# Patient Record
Sex: Female | Born: 1955 | Race: White | Hispanic: No | Marital: Married | Smoking: Never smoker
Health system: Southern US, Community
[De-identification: ages and names within clinical notes are randomized; demographics above are authoritative.]

## PROBLEM LIST (undated history)

## (undated) DIAGNOSIS — Z8719 Personal history of other diseases of the digestive system: Secondary | ICD-10-CM

## (undated) DIAGNOSIS — K5792 Diverticulitis of intestine, part unspecified, without perforation or abscess without bleeding: Secondary | ICD-10-CM

## (undated) DIAGNOSIS — G43909 Migraine, unspecified, not intractable, without status migrainosus: Secondary | ICD-10-CM

## (undated) DIAGNOSIS — J302 Other seasonal allergic rhinitis: Secondary | ICD-10-CM

## (undated) DIAGNOSIS — M1990 Unspecified osteoarthritis, unspecified site: Secondary | ICD-10-CM

## (undated) DIAGNOSIS — G8929 Other chronic pain: Secondary | ICD-10-CM

## (undated) DIAGNOSIS — M545 Low back pain, unspecified: Secondary | ICD-10-CM

## (undated) DIAGNOSIS — K760 Fatty (change of) liver, not elsewhere classified: Secondary | ICD-10-CM

## (undated) DIAGNOSIS — I10 Essential (primary) hypertension: Secondary | ICD-10-CM

## (undated) DIAGNOSIS — Z780 Asymptomatic menopausal state: Secondary | ICD-10-CM

## (undated) DIAGNOSIS — E785 Hyperlipidemia, unspecified: Secondary | ICD-10-CM

## (undated) DIAGNOSIS — K635 Polyp of colon: Secondary | ICD-10-CM

## (undated) DIAGNOSIS — E039 Hypothyroidism, unspecified: Secondary | ICD-10-CM

## (undated) DIAGNOSIS — T7840XA Allergy, unspecified, initial encounter: Secondary | ICD-10-CM

## (undated) DIAGNOSIS — G4733 Obstructive sleep apnea (adult) (pediatric): Secondary | ICD-10-CM

## (undated) DIAGNOSIS — M4850XA Collapsed vertebra, not elsewhere classified, site unspecified, initial encounter for fracture: Secondary | ICD-10-CM

## (undated) DIAGNOSIS — K219 Gastro-esophageal reflux disease without esophagitis: Secondary | ICD-10-CM

## (undated) HISTORY — DX: Unspecified osteoarthritis, unspecified site: M19.90

## (undated) HISTORY — DX: Low back pain, unspecified: M54.50

## (undated) HISTORY — DX: Other seasonal allergic rhinitis: J30.2

## (undated) HISTORY — DX: Other chronic pain: G89.29

## (undated) HISTORY — DX: Hypothyroidism, unspecified: E03.9

## (undated) HISTORY — DX: Collapsed vertebra, not elsewhere classified, site unspecified, initial encounter for fracture: M48.50XA

## (undated) HISTORY — PX: KNEE SURGERY: SHX244

## (undated) HISTORY — DX: Personal history of other diseases of the digestive system: Z87.19

## (undated) HISTORY — PX: ABDOMINAL HYSTERECTOMY: SHX81

## (undated) HISTORY — DX: Asymptomatic menopausal state: Z78.0

## (undated) HISTORY — PX: COLON SURGERY: SHX602

## (undated) HISTORY — DX: Fatty (change of) liver, not elsewhere classified: K76.0

## (undated) HISTORY — DX: Gastro-esophageal reflux disease without esophagitis: K21.9

## (undated) HISTORY — PX: COSMETIC SURGERY: SHX468

## (undated) HISTORY — DX: Migraine, unspecified, not intractable, without status migrainosus: G43.909

## (undated) HISTORY — PX: OTHER SURGICAL HISTORY: SHX169

## (undated) HISTORY — DX: Polyp of colon: K63.5

## (undated) HISTORY — DX: Essential (primary) hypertension: I10

## (undated) HISTORY — DX: Hyperlipidemia, unspecified: E78.5

## (undated) HISTORY — DX: Diverticulitis of intestine, part unspecified, without perforation or abscess without bleeding: K57.92

## (undated) HISTORY — PX: UPPER GASTROINTESTINAL ENDOSCOPY: SHX188

## (undated) HISTORY — PX: SPINE SURGERY: SHX786

## (undated) HISTORY — DX: Allergy, unspecified, initial encounter: T78.40XA

## (undated) HISTORY — DX: Low back pain: M54.5

## (undated) HISTORY — DX: Obstructive sleep apnea (adult) (pediatric): G47.33

---
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
## 1999-01-08 HISTORY — PX: ANTERIOR FUSION CERVICAL SPINE: SUR626

## 2006-09-09 HISTORY — PX: COLON RESECTION: SHX5231

## 2009-06-09 HISTORY — PX: TOTAL ABDOMINAL HYSTERECTOMY W/ BILATERAL SALPINGOOPHORECTOMY: SHX83

## 2009-09-09 HISTORY — PX: SPIROMETRY: SHX456

## 2010-07-27 LAB — COMPREHENSIVE METABOLIC PANEL
ALT: 8 U/L (ref 7–35)
Albumin: 4.3
CO2: 22 mmol/L
Calcium: 8.6 mg/dL
Chloride: 101 mmol/L
Total Bilirubin: 0.2 mg/dL
Total Protein: 7 g/dL

## 2010-07-27 LAB — VITAMIN D 25 HYDROXY (VIT D DEFICIENCY, FRACTURES): Vit D, 25-Hydroxy: 29.8

## 2010-07-27 LAB — LIPID PANEL
Direct LDL: 108
HDL: 65 mg/dL (ref 35–70)

## 2010-08-09 HISTORY — PX: US ECHOCARDIOGRAPHY: HXRAD669

## 2011-04-26 ENCOUNTER — Ambulatory Visit: Payer: Self-pay | Admitting: Family Medicine

## 2011-04-29 ENCOUNTER — Ambulatory Visit (INDEPENDENT_AMBULATORY_CARE_PROVIDER_SITE_OTHER): Payer: Medicare Other | Admitting: Family Medicine

## 2011-04-29 ENCOUNTER — Encounter: Payer: Self-pay | Admitting: Family Medicine

## 2011-04-29 DIAGNOSIS — K219 Gastro-esophageal reflux disease without esophagitis: Secondary | ICD-10-CM

## 2011-04-29 DIAGNOSIS — F331 Major depressive disorder, recurrent, moderate: Secondary | ICD-10-CM | POA: Insufficient documentation

## 2011-04-29 DIAGNOSIS — G4733 Obstructive sleep apnea (adult) (pediatric): Secondary | ICD-10-CM

## 2011-04-29 DIAGNOSIS — I10 Essential (primary) hypertension: Secondary | ICD-10-CM | POA: Insufficient documentation

## 2011-04-29 DIAGNOSIS — G43909 Migraine, unspecified, not intractable, without status migrainosus: Secondary | ICD-10-CM

## 2011-04-29 DIAGNOSIS — N951 Menopausal and female climacteric states: Secondary | ICD-10-CM

## 2011-04-29 DIAGNOSIS — M1990 Unspecified osteoarthritis, unspecified site: Secondary | ICD-10-CM | POA: Insufficient documentation

## 2011-04-29 DIAGNOSIS — F329 Major depressive disorder, single episode, unspecified: Secondary | ICD-10-CM

## 2011-04-29 DIAGNOSIS — M129 Arthropathy, unspecified: Secondary | ICD-10-CM

## 2011-04-29 DIAGNOSIS — R232 Flushing: Secondary | ICD-10-CM

## 2011-04-29 DIAGNOSIS — E039 Hypothyroidism, unspecified: Secondary | ICD-10-CM

## 2011-04-29 DIAGNOSIS — Z0000 Encounter for general adult medical examination without abnormal findings: Secondary | ICD-10-CM

## 2011-04-29 MED ORDER — OMEPRAZOLE 40 MG PO CPDR: 40.0000 mg | DELAYED_RELEASE_CAPSULE | Freq: Every day | ORAL | Status: DC

## 2011-04-29 MED ORDER — LOSARTAN POTASSIUM 50 MG PO TABS: 50.0000 mg | ORAL_TABLET | Freq: Every day | ORAL | Status: DC

## 2011-04-29 MED ORDER — CITALOPRAM HYDROBROMIDE 10 MG PO TABS: 10.0000 mg | ORAL_TABLET | Freq: Every day | ORAL | Status: DC

## 2011-04-29 MED ORDER — LEVOTHYROXINE SODIUM 137 MCG PO TABS: 137.0000 ug | ORAL_TABLET | Freq: Every day | ORAL | Status: DC

## 2011-04-29 MED ORDER — CHOLECALCIFEROL 25 MCG (1000 UT) PO TABS: 2000.0000 [IU] | ORAL_TABLET | Freq: Every day | ORAL | Status: DC

## 2011-04-29 NOTE — Assessment & Plan Note (Signed)
Concern for sacroiliitis, treat with stretching exercises, NSAID, and tylenol. Notify me if not improving to consider referral to ortho. If R hip pain worsening, check xrays.  No risk factors for AVN, mild pain on exam.

## 2011-04-29 NOTE — Assessment & Plan Note (Signed)
Endorses worsening mood swings attributed to menopause. On wellbutrin 300mg  daily Add ssri.  Discussed possible deterioration prior to improvement. Also hopeful to help hot flashes. RTC 1 mo for f/u.

## 2011-04-29 NOTE — Assessment & Plan Note (Signed)
Start SSRI.  
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
## 2011-04-29 NOTE — Assessment & Plan Note (Signed)
Check TSH when returns fasting for blood work.

## 2011-04-29 NOTE — Assessment & Plan Note (Signed)
Stable. Refilled meds.  

## 2011-04-29 NOTE — Patient Instructions (Addendum)
For left back pain - sound like sacroiliitis - treat with continued motrin as needed as well as may add tylenol to motrin.  Ice to back. Stretching exercises provided. If not improving as expected, let me know for orthopedic referral for further evaluation. I will await records from prior PCP. Good to meet you today, call us with questions. Come in Wednesday fasting for blood work.  We will check thyroid as well as other baseline labs. Return in Jan or Feb for physical. Return in 1 month for follow up - watch mood and worsening on celexa, if that happens immediately let me know and we will change around.

## 2011-05-01 ENCOUNTER — Other Ambulatory Visit: Payer: Self-pay | Admitting: Family Medicine

## 2011-05-01 ENCOUNTER — Other Ambulatory Visit (INDEPENDENT_AMBULATORY_CARE_PROVIDER_SITE_OTHER): Payer: Medicare Other | Admitting: Family Medicine

## 2011-05-01 DIAGNOSIS — I10 Essential (primary) hypertension: Secondary | ICD-10-CM

## 2011-05-01 DIAGNOSIS — E039 Hypothyroidism, unspecified: Secondary | ICD-10-CM

## 2011-05-01 DIAGNOSIS — N951 Menopausal and female climacteric states: Secondary | ICD-10-CM

## 2011-05-01 DIAGNOSIS — R232 Flushing: Secondary | ICD-10-CM

## 2011-05-01 LAB — CBC WITH DIFFERENTIAL/PLATELET
Basophils Absolute: 0 10*3/uL (ref 0.0–0.1)
Eosinophils Absolute: 0.2 10*3/uL (ref 0.0–0.7)
HCT: 39.4 % (ref 36.0–46.0)
Hemoglobin: 13.1 g/dL (ref 12.0–15.0)
Lymphs Abs: 2 10*3/uL (ref 0.7–4.0)
MCHC: 33.2 g/dL (ref 30.0–36.0)
MCV: 90.6 fl (ref 78.0–100.0)
Monocytes Absolute: 0.4 10*3/uL (ref 0.1–1.0)
Monocytes Relative: 5.3 % (ref 3.0–12.0)
Neutro Abs: 4.7 10*3/uL (ref 1.4–7.7)
Platelets: 241 10*3/uL (ref 150.0–400.0)
RDW: 13.3 % (ref 11.5–14.6)

## 2011-05-01 LAB — LIPID PANEL
HDL: 56 mg/dL (ref >39.00)
Total CHOL/HDL Ratio: 4

## 2011-05-01 LAB — COMPREHENSIVE METABOLIC PANEL
ALT: 15 U/L (ref 0–35)
Albumin: 4.1 g/dL (ref 3.5–5.2)
Alkaline Phosphatase: 83 U/L (ref 39–117)
Glucose, Bld: 94 mg/dL (ref 70–99)
Potassium: 4.2 mEq/L (ref 3.5–5.1)
Sodium: 140 mEq/L (ref 135–145)
Total Protein: 7.3 g/dL (ref 6.0–8.3)

## 2011-05-01 LAB — LDL CHOLESTEROL, DIRECT: Direct LDL: 139.2 mg/dL

## 2011-05-01 LAB — TSH: TSH: 0.21 u[IU]/mL — ABNORMAL LOW (ref 0.35–5.50)

## 2011-05-01 MED ORDER — LEVOTHYROXINE SODIUM 125 MCG PO TABS: 137.0000 ug | ORAL_TABLET | Freq: Every day | ORAL | Status: DC

## 2011-05-01 NOTE — Progress Notes (Signed)
Please notify cholesterol somewhat elevated.   Kidneys, liver, sugar normal.  Blood count normal. Thyroid somewhat high.  Would like her to start taking daily (sent in new dose). If just filled 90 of , could take 1/2 tab one day a week to try and equal new weekly dose (other days would be whole tab), but will be easier and more consistent to do new dose of daily.Marland Kitchen

## 2011-05-02 MED ORDER — LEVOTHYROXINE SODIUM 125 MCG PO TABS: 125.0000 ug | ORAL_TABLET | Freq: Every day | ORAL | Status: DC

## 2011-05-02 NOTE — Progress Notes (Signed)
Left message on mail for patient to return my call.

## 2011-05-02 NOTE — Progress Notes (Signed)
Addended by: Eustaquio Boyden on: 05/02/2011 11:19 AM   Modules accepted: Orders

## 2011-05-03 ENCOUNTER — Telehealth: Payer: Self-pay | Admitting: Family Medicine

## 2011-05-03 NOTE — Progress Notes (Signed)
Patient advised of results.

## 2011-05-05 ENCOUNTER — Encounter: Payer: Self-pay | Admitting: Family Medicine

## 2011-05-07 ENCOUNTER — Encounter: Payer: Self-pay | Admitting: Family Medicine

## 2011-05-27 ENCOUNTER — Other Ambulatory Visit: Payer: Self-pay | Admitting: Family Medicine

## 2011-05-27 NOTE — Telephone Encounter (Signed)
Patient has upcoming appt at the end of Sept.

## 2011-06-03 ENCOUNTER — Encounter: Payer: Self-pay | Admitting: Family Medicine

## 2011-06-03 ENCOUNTER — Ambulatory Visit (INDEPENDENT_AMBULATORY_CARE_PROVIDER_SITE_OTHER): Payer: Medicare Other | Admitting: Family Medicine

## 2011-06-03 VITALS — BP 128/70 | HR 76 | Temp 98.0°F | Wt 190.0 lb

## 2011-06-03 DIAGNOSIS — J329 Chronic sinusitis, unspecified: Secondary | ICD-10-CM

## 2011-06-03 DIAGNOSIS — R232 Flushing: Secondary | ICD-10-CM

## 2011-06-03 DIAGNOSIS — E039 Hypothyroidism, unspecified: Secondary | ICD-10-CM

## 2011-06-03 DIAGNOSIS — N951 Menopausal and female climacteric states: Secondary | ICD-10-CM

## 2011-06-03 DIAGNOSIS — E785 Hyperlipidemia, unspecified: Secondary | ICD-10-CM

## 2011-06-03 MED ORDER — LEVOTHYROXINE SODIUM 125 MCG PO TABS: 125.0000 ug | ORAL_TABLET | Freq: Every day | ORAL | Status: DC

## 2011-06-03 MED ORDER — LOSARTAN POTASSIUM 50 MG PO TABS: 50.0000 mg | ORAL_TABLET | Freq: Every day | ORAL | Status: DC

## 2011-06-03 MED ORDER — CITALOPRAM HYDROBROMIDE 10 MG PO TABS: 10.0000 mg | ORAL_TABLET | Freq: Every day | ORAL | Status: DC

## 2011-06-03 MED ORDER — BUPROPION HCL ER (SR) 200 MG PO TB12: 200.0000 mg | ORAL_TABLET | Freq: Two times a day (BID) | ORAL | Status: DC

## 2011-06-03 MED ORDER — FLUTICASONE PROPIONATE 50 MCG/ACT NA SUSP: 2.0000 | Freq: Every day | NASAL | Status: DC

## 2011-06-03 MED ORDER — OMEPRAZOLE 40 MG PO CPDR: 40.0000 mg | DELAYED_RELEASE_CAPSULE | Freq: Every day | ORAL | Status: DC

## 2011-06-03 NOTE — Assessment & Plan Note (Signed)
Discussed goal of LDL <100 given strong fmhx CAD. Provided with low chol diet, discussed healthy diet.

## 2011-06-03 NOTE — Assessment & Plan Note (Signed)
Recheck TSH in 2 wks after recent decrease in synthroid.

## 2011-06-03 NOTE — Progress Notes (Signed)
Subjective:    Patient ID: Brittany [name], female    DOB: Oct 27, 1955, 55 y.o.   MRN: [id number]  HPI CC: 1 mo f/u  Hot flashes improved.  Doing well with celexa.  Hypothyroid - doing well with decreased dose synthroid.  Discussed cholesterol levels.  Given fmhx, goal <130, ideally <100.  Recent stress - brother in law with MI over weekend, just released from hospital.  Sinusitis? - 5-6 d h/o nasal congestion, als thick yellow purulent mucous coming out when blowing nose.  + PND and coughing.  Tends to get bronchitis easily.  Using nyquil and dayquil, allergy medicine and mucinex.  Vitamin C.  Low grade fever to 100.  Child was sick as well.  No abd pain, n/v.  Some loose bowels.  No tooth pain.  Ear feels stuffed.    Medications and allergies reviewed and updated in chart.  Past histories reviewed and updated if relevant as below. Patient Active Problem List  Diagnoses  . Arthritis  . Depression  . GERD (gastroesophageal reflux disease)  . HTN (hypertension)  . Migraines  . OSA (obstructive sleep apnea)  . Hypothyroid  . Hot flashes   Past Medical History  Diagnosis Date  . Arthritis   . Depression     failed paxil, lexapro, cymbalta, zoloft, pristiq  . GERD (gastroesophageal reflux disease)   . Seasonal allergies   . HTN (hypertension)   . Migraines     occasional, stress related  . OSA (obstructive sleep apnea)     mild, no CPAP  . Hypothyroid   . Colon polyps     last colonoscopy 2006? rpt due  . History of diverticulitis of colon     s/p resection of large intestine  . Postmenopausal     HRT compound, previously premarin  . Fatty liver    Past Surgical History  Procedure Date  . Tonsillectomy 1964  . Anterior fusion cervical spine 01/1999    cervical HNP  . Knee surgery 1995, 2011    torn menisci (MRI 2010 L)  . Abdominal hysterectomy 06/2009    vag bleeding  . Colon resection 2008    2 ft removed, diverticulitis  . US echocardiography 08/2010   normal, EF 63%, mild valvular issues  . Spirometry 2011    normal   History  Substance Use Topics  . Smoking status: Never Smoker   . Smokeless tobacco: Never Used  . Alcohol Use: Yes     rare   Family History  Problem Relation Age of Onset  . Coronary artery disease Mother   . Arthritis Mother   . Coronary artery disease Father 6    CAD/MI  . Hypertension Father   . Diabetes Sister   . Hypertension Sister   . Hypertension Brother   . Cancer Neg Hx    Allergies  Allergen Reactions  . Cymbalta (Duloxetine Hcl) Other (See Comments)    suicidality   Current Outpatient Prescriptions on File Prior to Visit  Medication Sig Dispense Refill  . calcium elemental as carbonate (BARIATRIC TUMS ULTRA) 400 MG tablet Chew 1,000 mg by mouth daily.        . carisoprodol (SOMA) 350 MG tablet Take 350 mg by mouth 4 (four) times daily as needed.        . Cholecalciferol 1000 UNITS tablet Take 2 tablets (2,000 Units total) by mouth daily.      Marland Kitchen ibuprofen (ADVIL,MOTRIN) 200 MG tablet Take 200 mg by mouth every 8 (eight) hours as  needed.        . Multiple Vitamins-Minerals (ALIVE WOMENS ENERGY PO) Take 1 tablet by mouth daily.        . NONFORMULARY/COMPOUNDED ITEM Apply 1 each topically 1 day or 1 dose. Biest 2.5 Prog 10 mg/gm      . zolpidem (AMBIEN) 10 MG tablet Take 10 mg by mouth at bedtime as needed.         Review of Systems Per HPI    Objective:   Physical Exam  Nursing note and vitals reviewed. Constitutional: She appears well-developed and well-nourished. No distress.       cough  HENT:  Head: Normocephalic and atraumatic.  Right Ear: Hearing, tympanic membrane, external ear and ear canal normal.  Left Ear: Hearing, tympanic membrane, external ear and ear canal normal.  Nose: Mucosal edema (R>L) present. No rhinorrhea. Right sinus exhibits no maxillary sinus tenderness and no frontal sinus tenderness. Left sinus exhibits no maxillary sinus tenderness and no frontal sinus  tenderness.  Mouth/Throat: Uvula is midline, oropharynx is clear and moist and mucous membranes are normal. No oropharyngeal exudate, posterior oropharyngeal edema, posterior oropharyngeal erythema or tonsillar abscesses.  Eyes: Conjunctivae and EOM are normal. Pupils are equal, round, and reactive to light. No scleral icterus.  Neck: Normal range of motion. Neck supple. No JVD present. No thyromegaly present.  Cardiovascular: Normal rate, regular rhythm, normal heart sounds and intact distal pulses.   No murmur heard. Pulmonary/Chest: Effort normal and breath sounds normal. No respiratory distress. She has no wheezes. She has no rales.  Lymphadenopathy:    She has no cervical adenopathy.  Skin: Skin is warm and dry. No rash noted.          Assessment & Plan:

## 2011-06-03 NOTE — Patient Instructions (Signed)
You have a sinus infection. Take medicine as prescribed: flonase 2 sprays in each nostril daily for next 1-2 weeks. Push fluids and plenty of rest. Nasal saline irrigation or neti pot to help drain sinuses. May use simple mucinex with plenty of fluid to help mobilize mucous. Return if fever >101.5, trouble opening/closing mouth, difficulty swallowing, or worsening.  Call us if worsening or not improving past Thursday or Friday. Return in 1 month for thyroid recheck.  Keep physical appointment for January. Good to see you today, call us with questions.

## 2011-06-03 NOTE — Assessment & Plan Note (Signed)
Improved on SSRI, continue.

## 2011-06-03 NOTE — Assessment & Plan Note (Signed)
New acute problem. Likely viral sinusitis. Treat supportively, add flonase. If worsening or going on past 10 days, treat with abx.

## 2011-07-03 ENCOUNTER — Other Ambulatory Visit (INDEPENDENT_AMBULATORY_CARE_PROVIDER_SITE_OTHER): Payer: Medicare Other

## 2011-07-03 ENCOUNTER — Telehealth: Payer: Self-pay | Admitting: Family Medicine

## 2011-07-03 DIAGNOSIS — E039 Hypothyroidism, unspecified: Secondary | ICD-10-CM

## 2011-07-03 LAB — TSH: TSH: 0.15 u[IU]/mL — ABNORMAL LOW (ref 0.35–5.50)

## 2011-07-03 MED ORDER — LEVOTHYROXINE SODIUM 100 MCG PO TABS: 100.0000 ug | ORAL_TABLET | Freq: Every day | ORAL | Status: DC

## 2011-07-03 NOTE — Telephone Encounter (Signed)
Please notify thyroid staying elevated. Verify what she has been taking for last several weeks.  If , would like to back down to .  Sent new dose in.

## 2011-07-03 NOTE — Telephone Encounter (Signed)
Spoke with patient and she confirmed she was taking 125 mcg. I advised of new Rx and scheduled repeat TSH in 1 month per patient request. Please add the order for her lab. Thanks!

## 2011-07-03 NOTE — Telephone Encounter (Signed)
Added TSH, would like rechecked in 6 weeks.

## 2011-07-03 NOTE — Telephone Encounter (Signed)
Message left with patient's husband to return my call.  

## 2011-07-04 NOTE — Telephone Encounter (Signed)
Patient notified and lab appt rescheduled for 6 weeks.

## 2011-07-26 ENCOUNTER — Other Ambulatory Visit: Payer: Medicare Other

## 2011-08-09 ENCOUNTER — Other Ambulatory Visit (INDEPENDENT_AMBULATORY_CARE_PROVIDER_SITE_OTHER): Payer: Medicare Other

## 2011-08-09 ENCOUNTER — Other Ambulatory Visit: Payer: Medicare Other

## 2011-08-09 DIAGNOSIS — E039 Hypothyroidism, unspecified: Secondary | ICD-10-CM

## 2011-08-09 LAB — TSH: TSH: 2.27 u[IU]/mL (ref 0.35–5.50)

## 2011-08-12 ENCOUNTER — Other Ambulatory Visit: Payer: Self-pay | Admitting: *Deleted

## 2011-08-12 MED ORDER — LEVOTHYROXINE SODIUM 100 MCG PO TABS: 100.0000 ug | ORAL_TABLET | Freq: Every day | ORAL | Status: DC

## 2011-08-12 NOTE — Telephone Encounter (Signed)
Patient requested Rx refill to mail order.

## 2011-09-16 ENCOUNTER — Ambulatory Visit (INDEPENDENT_AMBULATORY_CARE_PROVIDER_SITE_OTHER): Payer: BC Managed Care – PPO | Admitting: Family Medicine

## 2011-09-16 ENCOUNTER — Encounter: Payer: Self-pay | Admitting: Family Medicine

## 2011-09-16 VITALS — BP 142/88 | HR 76 | Temp 98.3°F | Wt 192.0 lb

## 2011-09-16 DIAGNOSIS — M25569 Pain in unspecified knee: Secondary | ICD-10-CM

## 2011-09-16 DIAGNOSIS — Z0000 Encounter for general adult medical examination without abnormal findings: Secondary | ICD-10-CM

## 2011-09-16 DIAGNOSIS — M4712 Other spondylosis with myelopathy, cervical region: Secondary | ICD-10-CM | POA: Insufficient documentation

## 2011-09-16 DIAGNOSIS — M542 Cervicalgia: Secondary | ICD-10-CM

## 2011-09-16 DIAGNOSIS — M25562 Pain in left knee: Secondary | ICD-10-CM | POA: Insufficient documentation

## 2011-09-16 DIAGNOSIS — Z1231 Encounter for screening mammogram for malignant neoplasm of breast: Secondary | ICD-10-CM | POA: Diagnosis not present

## 2011-09-16 MED ORDER — PREDNISONE 20 MG PO TABS: ORAL_TABLET | ORAL | Status: DC
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
## 2011-09-16 NOTE — Patient Instructions (Addendum)
For knee sounds like bursitis - do straight leg stretching exercises.  If not better let me know for referral to orthopedist. For neck - steroid course today. We will try and get records from [facility] (sign release today). I will try to get you set up for MRI. Blood work Friday. Let me know if numbness weakness or pain is getting worse
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
## 2011-09-16 NOTE — Assessment & Plan Note (Addendum)
Unsure when last MRI was - will request records from prior eval. Will order MRI to eval return of sxs with paresthesias, numbness in pt with h/o anterior cervical fusion. (h/o plate/screw in neck, but pt states able to have MRI). Treat with continued soma, start short prednisone course.   After done with steroids, restart ibuprofen.  Pt agrees with plan.

## 2011-09-20 ENCOUNTER — Other Ambulatory Visit (INDEPENDENT_AMBULATORY_CARE_PROVIDER_SITE_OTHER): Payer: BC Managed Care – PPO

## 2011-09-20 DIAGNOSIS — Z0000 Encounter for general adult medical examination without abnormal findings: Secondary | ICD-10-CM

## 2011-09-20 LAB — BASIC METABOLIC PANEL
BUN: 18 mg/dL (ref 6–23)
CO2: 28 mEq/L (ref 19–32)
Calcium: 9 mg/dL (ref 8.4–10.5)
Chloride: 104 mEq/L (ref 96–112)
Creatinine, Ser: 1 mg/dL (ref 0.4–1.2)

## 2011-09-20 LAB — LIPID PANEL
Cholesterol: 214 mg/dL — ABNORMAL HIGH (ref 0–200)
Total CHOL/HDL Ratio: 3
Triglycerides: 58 mg/dL (ref 0.0–149.0)
VLDL: 11.6 mg/dL (ref 0.0–40.0)

## 2011-09-20 LAB — LDL CHOLESTEROL, DIRECT: Direct LDL: 126.1 mg/dL

## 2011-09-20 LAB — TSH: TSH: 3.17 u[IU]/mL (ref 0.35–5.50)

## 2011-09-21 ENCOUNTER — Ambulatory Visit
Admission: RE | Admit: 2011-09-21 | Discharge: 2011-09-21 | Disposition: A | Discharge status: Home / Self Care | Payer: Medicare Other | Source: Ambulatory Visit | Attending: Family Medicine | Admitting: Family Medicine

## 2011-09-21 DIAGNOSIS — R209 Unspecified disturbances of skin sensation: Secondary | ICD-10-CM | POA: Diagnosis not present

## 2011-09-21 DIAGNOSIS — M5020 Other cervical disc displacement, unspecified cervical region: Secondary | ICD-10-CM | POA: Diagnosis not present

## 2011-09-21 DIAGNOSIS — M542 Cervicalgia: Secondary | ICD-10-CM

## 2011-09-21 DIAGNOSIS — M5030 Other cervical disc degeneration, unspecified cervical region: Secondary | ICD-10-CM | POA: Diagnosis not present

## 2011-09-23 ENCOUNTER — Telehealth: Payer: Self-pay | Admitting: Family Medicine

## 2011-09-23 DIAGNOSIS — M5030 Other cervical disc degeneration, unspecified cervical region: Secondary | ICD-10-CM

## 2011-09-23 NOTE — Telephone Encounter (Signed)
Message left for patient to return my call.  

## 2011-09-23 NOTE — Telephone Encounter (Signed)
Please notify MRI showing significant DDD and arthritis in cervical neck, worse on right side at C4/5 although pt's sxs were worse on left. I'd like to set her up with spine doctor for further evaluation, possible ESI. Placed order in chart.

## 2011-09-23 NOTE — Telephone Encounter (Signed)
Patient notified. She is interested in seeing a spine doctor. She has an appt with you this week and will discuss things further with you at that time.

## 2011-09-26 ENCOUNTER — Ambulatory Visit (INDEPENDENT_AMBULATORY_CARE_PROVIDER_SITE_OTHER): Payer: BC Managed Care – PPO | Admitting: Family Medicine

## 2011-09-26 ENCOUNTER — Encounter: Payer: Self-pay | Admitting: Family Medicine

## 2011-09-26 VITALS — BP 132/80 | HR 80 | Temp 98.4°F | Ht 64.0 in | Wt 195.2 lb

## 2011-09-26 DIAGNOSIS — N951 Menopausal and female climacteric states: Secondary | ICD-10-CM

## 2011-09-26 DIAGNOSIS — I10 Essential (primary) hypertension: Secondary | ICD-10-CM

## 2011-09-26 DIAGNOSIS — Z23 Encounter for immunization: Secondary | ICD-10-CM

## 2011-09-26 DIAGNOSIS — M4712 Other spondylosis with myelopathy, cervical region: Secondary | ICD-10-CM

## 2011-09-26 DIAGNOSIS — E785 Hyperlipidemia, unspecified: Secondary | ICD-10-CM | POA: Diagnosis not present

## 2011-09-26 DIAGNOSIS — H9190 Unspecified hearing loss, unspecified ear: Secondary | ICD-10-CM | POA: Diagnosis not present

## 2011-09-26 DIAGNOSIS — Z0000 Encounter for general adult medical examination without abnormal findings: Secondary | ICD-10-CM | POA: Insufficient documentation

## 2011-09-26 DIAGNOSIS — K635 Polyp of colon: Secondary | ICD-10-CM

## 2011-09-26 DIAGNOSIS — H6121 Impacted cerumen, right ear: Secondary | ICD-10-CM | POA: Insufficient documentation

## 2011-09-26 DIAGNOSIS — R232 Flushing: Secondary | ICD-10-CM

## 2011-09-26 MED ORDER — CARISOPRODOL 350 MG PO TABS: 350.0000 mg | ORAL_TABLET | Freq: Every evening | ORAL | Status: DC | PRN

## 2011-09-26 MED ORDER — BUPROPION HCL ER (SR) 200 MG PO TB12: 200.0000 mg | ORAL_TABLET | Freq: Two times a day (BID) | ORAL | Status: DC

## 2011-09-26 MED ORDER — CITALOPRAM HYDROBROMIDE 20 MG PO TABS: 20.0000 mg | ORAL_TABLET | Freq: Every day | ORAL | Status: DC

## 2011-09-26 MED ORDER — CITALOPRAM HYDROBROMIDE 20 MG PO TABS: 10.0000 mg | ORAL_TABLET | Freq: Every day | ORAL | Status: DC

## 2011-09-26 MED ORDER — LEVOTHYROXINE SODIUM 100 MCG PO TABS: 100.0000 ug | ORAL_TABLET | Freq: Every day | ORAL | Status: DC

## 2011-09-26 MED ORDER — OMEPRAZOLE 40 MG PO CPDR: 40.0000 mg | DELAYED_RELEASE_CAPSULE | Freq: Every day | ORAL | Status: DC

## 2011-09-26 MED ORDER — LOSARTAN POTASSIUM 50 MG PO TABS: 50.0000 mg | ORAL_TABLET | Freq: Every day | ORAL | Status: DC

## 2011-09-26 NOTE — Assessment & Plan Note (Addendum)
Set up colonoscopy as due.

## 2011-09-26 NOTE — Assessment & Plan Note (Signed)
Stable on med

## 2011-09-26 NOTE — Assessment & Plan Note (Signed)
Preventative protocols reviewed and updated unless pt declined. Flu and Tdap today. CBE today.  Scheduled for mammo. To schedule colonoscopy - with marion today.

## 2011-09-26 NOTE — Patient Instructions (Addendum)
Flu and Tdap today. Good to see you, call us with quesitons. Mammogram for end of this month. Pass by Marion's office to schedule colonoscopy. Call us to schedule spine doctor.

## 2011-09-26 NOTE — Assessment & Plan Note (Signed)
Worked well temporarily, then stopped. Would like increase in SSRI, increased to 20mg  celexa.

## 2011-09-26 NOTE — Progress Notes (Signed)
Subjective:    Patient ID: Brittany [name], female    DOB: 1956/04/06, 56 y.o.   MRN: [id number]  HPI CC: CPE  Presents for CPE today.  [facility].  Hot flashes- celexa 10mg  daily helped, but now returning.  Would like increased to 20mg  daily.  Having some nausea because has been 2 days without celexa.  Steroid course helped knee pain, continued neck pain.  Will schedule appt with spine doc when returns from upcoming trip.  Preventative: Flu shot - today Last tetanus - Tdap today Last CPE with blood work - January 2012 Well woman - last when done hysterectomy (benign reason). Doesn't need rpt pap. Breast exams with primary care, always normal mammograms, last 1 or 10/2010.  Breast exam today, has appt for mammogram 10/09/2011. Last colonoscopy - 2006, h/o polyps. Hearing problems - would like screening.  Having trouble hearing son.  Medications and allergies reviewed and updated in chart.  Past histories reviewed and updated if relevant as below. Patient Active Problem List  Diagnoses  . Arthritis  . Depression  . GERD (gastroesophageal reflux disease)  . HTN (hypertension)  . Migraines  . OSA (obstructive sleep apnea)  . Hypothyroid  . Hot flashes  . HLD (hyperlipidemia)  . Left knee pain  . Neck pain   Past Medical History  Diagnosis Date  . Arthritis   . Depression     failed paxil, lexapro, cymbalta, zoloft, pristiq  . GERD (gastroesophageal reflux disease)   . Seasonal allergies   . HTN (hypertension)   . Migraines     occasional, stress related  . OSA (obstructive sleep apnea)     mild, no CPAP  . Hypothyroid   . Colon polyps     last colonoscopy 2006? rpt due  . History of diverticulitis of colon     s/p resection of large intestine  . Postmenopausal     HRT compound, previously premarin  . Fatty liver    Past Surgical History  Procedure Date  . Tonsillectomy 1964  . Anterior fusion cervical spine 01/1999   cervical HNP  . Knee surgery 1995, 2011    torn menisci (MRI 2010 L)  . Abdominal hysterectomy 06/2009    vag bleeding  . Colon resection 2008    2 ft removed, diverticulitis  . US echocardiography 08/2010    normal, EF 63%, mild valvular issues  . Spirometry 2011    normal   History  Substance Use Topics  . Smoking status: Never Smoker   . Smokeless tobacco: Never Used  . Alcohol Use: Yes     rare   Family History  Problem Relation Age of Onset  . Coronary artery disease Mother   . Arthritis Mother   . Coronary artery disease Father 74    CAD/MI  . Hypertension Father   . Diabetes Sister   . Hypertension Sister   . Hypertension Brother   . Cancer Neg Hx    Allergies  Allergen Reactions  . Cymbalta (Duloxetine Hcl) Other (See Comments)    suicidality   Current Outpatient Prescriptions on File Prior to Visit  Medication Sig Dispense Refill  . Multiple Vitamins-Minerals (ALIVE WOMENS ENERGY PO) Take 1 tablet by mouth daily.        . fluticasone (FLONASE) 50 MCG/ACT nasal spray Place 2 sprays into the nose daily.  16 g  3  . ibuprofen (ADVIL,MOTRIN) 200 MG tablet Take 600 mg by mouth as needed.  Review of Systems  Constitutional: Negative for fever, chills, activity change, appetite change, fatigue and unexpected weight change.  HENT: Negative for hearing loss and neck pain.   Eyes: Negative for visual disturbance.  Respiratory: Negative for cough, chest tightness, shortness of breath and wheezing.   Cardiovascular: Negative for chest pain, palpitations and leg swelling.  Gastrointestinal: Negative for nausea, vomiting, abdominal pain, diarrhea, constipation, blood in stool and abdominal distention.  Genitourinary: Negative for hematuria and difficulty urinating.  Musculoskeletal: Negative for myalgias and arthralgias.  Skin: Negative for rash.  Neurological: Negative for dizziness, seizures, syncope and headaches.  Hematological: Does not bruise/bleed easily.    Psychiatric/Behavioral: Negative for dysphoric mood. The patient is not nervous/anxious.        Objective:   Physical Exam  Nursing note and vitals reviewed. Constitutional: She is oriented to person, place, and time. She appears well-developed and well-nourished. No distress.  HENT:  Head: Normocephalic and atraumatic.  Right Ear: External ear normal.  Left Ear: External ear normal.  Nose: Nose normal.  Mouth/Throat: Oropharynx is clear and moist. No oropharyngeal exudate.  Eyes: Conjunctivae and EOM are normal. Pupils are equal, round, and reactive to light. No scleral icterus.  Neck: Normal range of motion. Neck supple. No thyromegaly present.  Cardiovascular: Normal rate, regular rhythm, normal heart sounds and intact distal pulses.   No murmur heard. Pulses:      Radial pulses are 2+ on the right side, and 2+ on the left side.  Pulmonary/Chest: Effort normal and breath sounds normal. No respiratory distress. She has no wheezes. She has no rales. Right breast exhibits no inverted nipple, no mass, no nipple discharge, no skin change and no tenderness. Left breast exhibits no inverted nipple, no mass, no nipple discharge, no skin change and no tenderness.  Abdominal: Soft. Bowel sounds are normal. She exhibits no distension and no mass. There is no tenderness. There is no rebound and no guarding.  Musculoskeletal: Normal range of motion.  Lymphadenopathy:    She has no cervical adenopathy.    She has no axillary adenopathy.       Right: No supraclavicular adenopathy present.       Left: No supraclavicular adenopathy present.  Neurological: She is alert and oriented to person, place, and time.       CN grossly intact, station and gait intact  Skin: Skin is warm and dry. No rash noted.  Psychiatric: She has a normal mood and affect. Her behavior is normal. Judgment and thought content normal.       Assessment & Plan:

## 2011-09-26 NOTE — Assessment & Plan Note (Signed)
Reviewed blood work.  LDL 120s, HDL >60.  Continue off med.  Strong fmhx CAD.

## 2011-09-26 NOTE — Assessment & Plan Note (Signed)
Only with son's voice.  Screening test normal.  Monitor for now.

## 2011-10-10 ENCOUNTER — Ambulatory Visit: Payer: Medicare Other

## 2011-10-28 DIAGNOSIS — J40 Bronchitis, not specified as acute or chronic: Secondary | ICD-10-CM | POA: Diagnosis not present

## 2011-11-18 DIAGNOSIS — L259 Unspecified contact dermatitis, unspecified cause: Secondary | ICD-10-CM | POA: Diagnosis not present

## 2011-11-20 DIAGNOSIS — D234 Other benign neoplasm of skin of scalp and neck: Secondary | ICD-10-CM | POA: Diagnosis not present

## 2011-11-20 DIAGNOSIS — D489 Neoplasm of uncertain behavior, unspecified: Secondary | ICD-10-CM | POA: Diagnosis not present

## 2011-11-20 DIAGNOSIS — D1801 Hemangioma of skin and subcutaneous tissue: Secondary | ICD-10-CM | POA: Diagnosis not present

## 2011-11-20 DIAGNOSIS — E039 Hypothyroidism, unspecified: Secondary | ICD-10-CM | POA: Diagnosis not present

## 2011-11-27 DIAGNOSIS — E039 Hypothyroidism, unspecified: Secondary | ICD-10-CM | POA: Diagnosis not present

## 2012-01-09 ENCOUNTER — Ambulatory Visit: Payer: BC Managed Care – PPO

## 2012-01-13 DIAGNOSIS — IMO0002 Reserved for concepts with insufficient information to code with codable children: Secondary | ICD-10-CM | POA: Diagnosis not present

## 2012-01-13 DIAGNOSIS — M5020 Other cervical disc displacement, unspecified cervical region: Secondary | ICD-10-CM | POA: Diagnosis not present

## 2012-01-13 DIAGNOSIS — M542 Cervicalgia: Secondary | ICD-10-CM | POA: Diagnosis not present

## 2012-01-13 DIAGNOSIS — M47812 Spondylosis without myelopathy or radiculopathy, cervical region: Secondary | ICD-10-CM | POA: Diagnosis not present

## 2012-01-23 DIAGNOSIS — M5412 Radiculopathy, cervical region: Secondary | ICD-10-CM | POA: Diagnosis not present

## 2012-01-23 DIAGNOSIS — M542 Cervicalgia: Secondary | ICD-10-CM | POA: Diagnosis not present

## 2012-01-26 ENCOUNTER — Encounter: Payer: Self-pay | Admitting: Family Medicine

## 2012-01-30 DIAGNOSIS — M542 Cervicalgia: Secondary | ICD-10-CM | POA: Diagnosis not present

## 2012-01-30 DIAGNOSIS — M5412 Radiculopathy, cervical region: Secondary | ICD-10-CM | POA: Diagnosis not present

## 2012-02-05 DIAGNOSIS — M542 Cervicalgia: Secondary | ICD-10-CM | POA: Diagnosis not present

## 2012-02-05 DIAGNOSIS — M5412 Radiculopathy, cervical region: Secondary | ICD-10-CM | POA: Diagnosis not present

## 2012-02-06 ENCOUNTER — Encounter: Payer: Self-pay | Admitting: Family Medicine

## 2012-02-08 DIAGNOSIS — K760 Fatty (change of) liver, not elsewhere classified: Secondary | ICD-10-CM

## 2012-02-08 HISTORY — DX: Fatty (change of) liver, not elsewhere classified: K76.0

## 2012-02-12 DIAGNOSIS — M5412 Radiculopathy, cervical region: Secondary | ICD-10-CM | POA: Diagnosis not present

## 2012-02-12 DIAGNOSIS — M542 Cervicalgia: Secondary | ICD-10-CM | POA: Diagnosis not present

## 2012-02-18 DIAGNOSIS — M542 Cervicalgia: Secondary | ICD-10-CM | POA: Diagnosis not present

## 2012-02-18 DIAGNOSIS — M5412 Radiculopathy, cervical region: Secondary | ICD-10-CM | POA: Diagnosis not present

## 2012-02-20 ENCOUNTER — Emergency Department (HOSPITAL_COMMUNITY)
Admission: EM | Admit: 2012-02-20 | Discharge: 2012-02-20 | Disposition: A | Discharge status: Home / Self Care | Payer: Medicare Other | Attending: Emergency Medicine | Admitting: Emergency Medicine

## 2012-02-20 ENCOUNTER — Encounter (HOSPITAL_COMMUNITY): Payer: Self-pay | Admitting: Emergency Medicine

## 2012-02-20 ENCOUNTER — Emergency Department (HOSPITAL_COMMUNITY): Payer: Medicare Other

## 2012-02-20 DIAGNOSIS — R1032 Left lower quadrant pain: Secondary | ICD-10-CM | POA: Diagnosis not present

## 2012-02-20 DIAGNOSIS — Z8739 Personal history of other diseases of the musculoskeletal system and connective tissue: Secondary | ICD-10-CM | POA: Diagnosis not present

## 2012-02-20 DIAGNOSIS — K5792 Diverticulitis of intestine, part unspecified, without perforation or abscess without bleeding: Secondary | ICD-10-CM

## 2012-02-20 DIAGNOSIS — Z79899 Other long term (current) drug therapy: Secondary | ICD-10-CM | POA: Insufficient documentation

## 2012-02-20 DIAGNOSIS — K5732 Diverticulitis of large intestine without perforation or abscess without bleeding: Secondary | ICD-10-CM | POA: Diagnosis not present

## 2012-02-20 DIAGNOSIS — I10 Essential (primary) hypertension: Secondary | ICD-10-CM | POA: Insufficient documentation

## 2012-02-20 DIAGNOSIS — E039 Hypothyroidism, unspecified: Secondary | ICD-10-CM | POA: Insufficient documentation

## 2012-02-20 DIAGNOSIS — K219 Gastro-esophageal reflux disease without esophagitis: Secondary | ICD-10-CM | POA: Insufficient documentation

## 2012-02-20 LAB — URINALYSIS, ROUTINE W REFLEX MICROSCOPIC
Glucose, UA: NEGATIVE mg/dL
Hgb urine dipstick: NEGATIVE
Ketones, ur: NEGATIVE mg/dL
Protein, ur: NEGATIVE mg/dL
pH: 6 (ref 5.0–8.0)

## 2012-02-20 LAB — POCT I-STAT, CHEM 8
BUN: 15 mg/dL (ref 6–23)
Calcium, Ion: 1.16 mmol/L (ref 1.12–1.32)
Chloride: 103 meq/L (ref 96–112)
Creatinine, Ser: 1.2 mg/dL — ABNORMAL HIGH (ref 0.50–1.10)
Glucose, Bld: 105 mg/dL — ABNORMAL HIGH (ref 70–99)
HCT: 39 % (ref 36.0–46.0)
Hemoglobin: 13.3 g/dL (ref 12.0–15.0)
Potassium: 3.8 meq/L (ref 3.5–5.1)
Sodium: 141 meq/L (ref 135–145)
TCO2: 23 mmol/L (ref 0–100)

## 2012-02-20 LAB — DIFFERENTIAL
Basophils Absolute: 0 K/uL (ref 0.0–0.1)
Basophils Relative: 0 % (ref 0–1)
Eosinophils Absolute: 0.2 K/uL (ref 0.0–0.7)
Eosinophils Relative: 1 % (ref 0–5)
Lymphocytes Relative: 22 % (ref 12–46)
Lymphs Abs: 2.5 K/uL (ref 0.7–4.0)
Monocytes Absolute: 0.6 K/uL (ref 0.1–1.0)
Monocytes Relative: 6 % (ref 3–12)
Neutro Abs: 8 K/uL — ABNORMAL HIGH (ref 1.7–7.7)
Neutrophils Relative %: 71 % (ref 43–77)

## 2012-02-20 LAB — CBC
HCT: 37.1 % (ref 36.0–46.0)
MCHC: 33.4 g/dL (ref 30.0–36.0)
MCV: 90 fL (ref 78.0–100.0)
Platelets: 254 10*3/uL (ref 150–400)
RDW: 13.7 % (ref 11.5–15.5)

## 2012-02-20 LAB — URINE MICROSCOPIC-ADD ON

## 2012-02-20 MED ORDER — CIPROFLOXACIN HCL 500 MG PO TABS
500.0000 mg | ORAL_TABLET | Freq: Once | ORAL | Status: AC
  Administered 2012-02-20: 500 mg via ORAL
  Filled 2012-02-20: qty 1

## 2012-02-20 MED ORDER — SODIUM CHLORIDE 0.9 % IV BOLUS (SEPSIS)
1000.0000 mL | Freq: Once | INTRAVENOUS | Status: AC
  Administered 2012-02-20: 1000 mL via INTRAVENOUS

## 2012-02-20 MED ORDER — IOHEXOL 300 MG/ML  SOLN
20.0000 mL | INTRAMUSCULAR | Status: AC
  Administered 2012-02-20: 20 mL via ORAL

## 2012-02-20 MED ORDER — OXYCODONE-ACETAMINOPHEN 5-325 MG PO TABS: 1.0000 | ORAL_TABLET | Freq: Four times a day (QID) | ORAL | Status: DC | PRN

## 2012-02-20 MED ORDER — IOHEXOL 300 MG/ML  SOLN
100.0000 mL | Freq: Once | INTRAMUSCULAR | Status: AC | PRN
  Administered 2012-02-20: 100 mL via INTRAVENOUS

## 2012-02-20 MED ORDER — METRONIDAZOLE 500 MG PO TABS
500.0000 mg | ORAL_TABLET | Freq: Once | ORAL | Status: AC
  Administered 2012-02-20: 500 mg via ORAL
  Filled 2012-02-20: qty 1

## 2012-02-20 MED ORDER — METRONIDAZOLE 500 MG PO TABS: 500.0000 mg | ORAL_TABLET | Freq: Three times a day (TID) | ORAL | Status: DC

## 2012-02-20 MED ORDER — PROMETHAZINE HCL 25 MG RE SUPP: 12.5000 mg | Freq: Four times a day (QID) | RECTAL | Status: DC | PRN

## 2012-02-20 MED ORDER — ONDANSETRON HCL 4 MG/2ML IJ SOLN
4.0000 mg | Freq: Once | INTRAMUSCULAR | Status: AC
  Administered 2012-02-20: 4 mg via INTRAVENOUS
  Filled 2012-02-20: qty 2

## 2012-02-20 MED ORDER — CIPROFLOXACIN HCL 500 MG PO TABS: 500.0000 mg | ORAL_TABLET | Freq: Two times a day (BID) | ORAL | Status: DC

## 2012-02-20 MED ORDER — HYDROMORPHONE HCL PF 1 MG/ML IJ SOLN
1.0000 mg | Freq: Once | INTRAMUSCULAR | Status: AC
  Administered 2012-02-20: 1 mg via INTRAMUSCULAR
  Filled 2012-02-20: qty 1

## 2012-02-20 NOTE — ED Notes (Signed)
C/o L pelvic pain and nausea x 2 days.  Reports stool is "long and skinny."

## 2012-02-20 NOTE — ED Notes (Signed)
Pt c/o LLQ abd pain times 2 days. Pain is constant rated 7/10, not relieved by gasx or metamucil.  States BM's are "long and skinny".  Denies blood in stool. States low grade fever, nausea .Marland Kitchen  Denies urinary symptoms or vaginal discharge.

## 2012-02-20 NOTE — ED Provider Notes (Signed)
Medical screening examination/treatment/procedure(s) were performed by non-physician practitioner and as supervising physician I was immediately available for consultation/collaboration.  Jasmine Awe, MD 02/20/12 [phone]

## 2012-02-20 NOTE — Discharge Instructions (Signed)
Low Fiber and Residue Restricted Diet A low fiber diet restricts foods that contain carbohydrates that are not digested in the small intestine. A diet containing about 10 g of fiber is considered low fiber. The diet needs to be individualized to suit patient tolerances and preferences and to avoid unnecessary restrictions. Generally, the foods emphasized in a low fiber diet have no skins or seeds. They may have been processed to remove bran, germ, or husks. Cooking may not necessarily eliminate the fiber. Cooking may, in fact, enable a greater quantity of fiber to be consumed in a lesser volume. Legumes and nuts are also restricted. The term low residue has also been used to describe low fiber diets, although the two are not the same. Residue refers to any substance that adds to bowel (colonic) contents, such as sloughed cells and intestinal bacteria, in addition to fiber. Residue-containing foods, prunes and prune juice, milk, and connective tissue from meats may also need to be eliminated. It is important to eliminate these foods during sudden (acute) attacks of inflammatory bowel disease, when there is a partial obstruction due to another reason, or when minimal fecal output is desired. When these problems are gone, a more normal diet may be used. PURPOSE  Prevent blockage of a partially obstructed or narrowed gastrointestinal tract.   Reduce stool weight and volume.   Slow the movement of waste.  WHEN IS THIS DIET USED?  Acute phase of Crohn's disease, ulcerative colitis, regional enteritis, or diverticulitis.   Narrowing (stenosis) of intestinal or esophageal tubes (lumina).   Transitional diet following surgery, injury (trauma), or illness.  ADEQUACY This diet is nutritionally adequate based on individual food choices according to the Recommended Dietary Allowances of the National Research Council. CHOOSING FOODS Check labels, especially on foods from the starch list. Often, dietary fiber  content is listed with the Nutrition Facts panel.  Breads and Starches  Allowed: White, French, and pita breads, plain rolls, buns, or sweet rolls, doughnuts, waffles, pancakes, bagels. Plain muffins, sweet breads, biscuits, matzoth. Flour. Soda, saltine, or graham crackers. Pretzels, rusks, melba toast, zwieback. Cooked cereals: cornmeal, farina, cream cereals. Dry cereals: refined corn, wheat, rice, and oat cereals (check label). Potatoes prepared any way without skins, refined macaroni, spaghetti, noodles, refined rice.   Avoid: Bread, rolls, or crackers made with whole-wheat, multigrains, rye, bran seeds, nuts, or coconut. Corn tortillas, table-shells. Corn chips, tortilla chips. Cereals containing whole-grains, multigrains, bran, coconut, nuts, or raisins. Cooked or dry oatmeal. Coarse wheat cereals, granola. Cereals advertised as "high fiber." Potato skins. Whole-grain pasta, wild or brown rice. Popcorn.  Vegetables  Allowed:  Strained tomato and vegetable juices. Fresh: tender lettuce, cucumber, cabbage, spinach, bean sprouts. Cooked, canned: asparagus, bean sprouts, cut green or wax beans, cauliflower, pumpkin, beets, mushrooms, olives, spinach, yellow squash, tomato, tomato sauce (no seeds), zucchini (peeled), turnips. Canned sweet potatoes. Small amounts of celery, onion, radish, and green pepper may be used. Keep servings limited to  cup.   Avoid: Fresh, cooked, or canned: artichokes, baked beans, beet greens, broccoli, Brussels sprouts, French-style green beans, corn, kale, legumes, peas, sweet potatoes. Cooked: green or red cabbage, spinach. Avoid large servings of any vegetables.  Fruit  Allowed:  All fruit juices except prune juice. Cooked or canned: apricots applesauce, cantaloupe, cherries, grapefruit, grapes, kiwi, mandarin oranges, peaches, pears, fruit cocktail, pineapple, plums, watermelon. Fresh: banana, grapes, cantaloupe, avocado, cherries, pineapple, grapefruit, kiwi,  nectarines, peaches, oranges, blueberries, plums. Keep servings limited to  cup or 1 piece.     Avoid: Fresh: apple with or without skin, apricots, mango, pears, raspberries, strawberries. Prune juice, stewed or dried prunes. Dried fruits, raisins, dates. Avoid large servings of all fresh fruits.  Meat and Meat Substitutes  Allowed:  Ground or well-cooked tender beef, ham, veal, lamb, pork, or poultry. Eggs, plain cheese. Fish, oysters, shrimp, lobster, other seafood. Liver, organ meats.   Avoid: Tough, fibrous meats with gristle. Peanut butter, smooth or chunky. Cheese with seeds, nuts, or other foods not allowed. Nuts, seeds, legumes, dried peas, beans, lentils.  Milk  Allowed:  All milk products except those not allowed. Milk and milk product consumption should be minimal when low residue is desired.   Avoid: Yogurt that contains nuts or seeds.  Soups and Combination Foods  Allowed:  Bouillon, broth, or cream soups made from allowed foods. Any strained soup. Casseroles or mixed dishes made with allowed foods.   Avoid: Soups made from vegetables that are not allowed or that contain other foods not allowed.  Desserts and Sweets  Allowed:  Plain cakes and cookies, pie made with allowed fruit, pudding, custard, cream pie. Gelatin, fruit, ice, sherbet, frozen ice pops. Ice cream, ice milk without nuts. Plain hard candy, honey, jelly, molasses, syrup, sugar, chocolate syrup, gumdrops, marshmallows.   Avoid: Desserts, cookies, or candies that contain nuts, peanut butter, or dried fruits. Jams, preserves with seeds, marmalade.  Fats and Oils  Allowed:  Margarine, butter, cream, mayonnaise, salad oils, plain salad dressings made from allowed foods. Plain gravy, crisp bacon without rind.   Avoid: Seeds, nuts, olives. Avocados.  Beverages  Allowed:  All, except those listed to avoid.   Avoid: Fruit juices with high pulp, prune juice.  Condiments  Allowed:  Ketchup, mustard, horseradish,  vinegar, cream sauce, cheese sauce, cocoa powder. Spices in moderation: allspice, basil, bay leaves, celery powder or leaves, cinnamon, cumin powder, curry powder, ginger, mace, marjoram, onion or garlic powder, oregano, paprika, parsley flakes, ground pepper, rosemary, sage, savory, tarragon, thyme, turmeric.   Avoid: Coconut, pickles.  SAMPLE MEAL PLAN The following menu is provided as a sample. Your daily menu plans will vary. Be sure to include a minimum of the following each day in order to provide essential nutrients for the adult:  Starch/Bread/Cereal Group, 6 servings.   Fruit/Vegetable Group, 5 servings.   Meat/Meat Substitute Group, 2 servings.   Milk/Milk Substitute Group, 2 servings.  A serving is equal to  cup for fruits, vegetables, and cooked cereals or 1 piece for foods such as a piece of bread, 1 orange, or 1 apple. For dry cereals and crackers, use serving sizes listed on the label. Combination foods may count as full or partial servings from various food groups. Fats, desserts, and sweets may be added to the meal plan after the requirements for essential nutrients are met. SAMPLE MENU Breakfast   cup orange juice.   1 boiled egg.   1 slice white toast.   Margarine.    cup cornflakes.   1 cup milk.   Beverage.  Lunch   cup chicken noodle soup.   2 to 3 oz sliced roast beef.   2 slices seedless rye bread.   Mayonnaise.    cup tomato juice.   1 small banana.   Beverage.  Dinner  3 oz baked chicken.    cup scalloped potatoes.    cup cooked beets.   White dinner roll.   Margarine.    cup canned peaches.   Beverage.  Document Released: 02/15/2002 Document Revised: 08/15/2011   Document Reviewed: 07/29/2011 [employer] Patient Information 2012 [employer].Diverticulitis Small pockets or "bubbles" can develop in the wall of the intestine. Diverticulitis is when those pockets become infected and inflamed. This causes stomach pain (usually  on the left side). HOME CARE  Take all medicine as told by your doctor.   Try a clear liquid diet (broth, tea, or water) for as long as told by your doctor.   Keep all follow-up visits with your doctor.   You may be put on a low-fiber diet once you start feeling better. Here are foods that have low-fiber:   White breads, cereals, rice, and pasta.   Cooked fruits and vegetables or soft fresh fruits and vegetables without the skin.   Ground or well-cooked tender beef, ham, veal, lamb, pork, or poultry.   Eggs and seafood.   After you are doing well on the low-fiber diet, you may be put on a high-fiber diet. Here are ways to increase your fiber:   Choose whole-grain breads, cereals, pasta, and brown rice.   Choose fruits and vegetables with skin on. Do not overcook the vegetables.   Choose nuts, seeds, legumes, dried peas, beans, and lentils.   Look for food products that have more than 3 grams of fiber per serving on the food label.  GET HELP RIGHT AWAY IF:  Your pain does not get better or gets worse.   You have trouble eating food.   You are not pooping (having bowel movements) like normal.   You have a temperature by mouth above 102 F (38.9 C), not controlled by medicine.   You keep throwing up (vomiting).   You have bloody or black, tarry poop (stools).   You are getting worse and not better.  MAKE SURE YOU:   Understand these instructions.   Will watch your condition.   Will get help right away if you are not doing well or get worse.  Document Released: 02/12/2008 Document Revised: 08/15/2011 Document Reviewed: 07/17/2009 [employer] Patient Information 2012 [employer]. 

## 2012-02-20 NOTE — ED Provider Notes (Signed)
History     CSN: [id number]  Arrival date & time 02/20/12  3244   First MD Initiated Contact with Patient 02/20/12 [phone]      Chief Complaint  Patient presents with  . Pelvic Pain    (Consider location/radiation/quality/duration/timing/severity/associated sxs/prior treatment) HPI Comments: Patient with a history of diverticulosis and s/p colon resection in 2008 for this presents tonight with 2 day history of LLQ abdominal pain.  States that pain began suddenly, does not radiate, is sharp and stabbing in nature.  States not associated with chills, vomiting, constipation, hematochezia, dysuria, hematuria.  States has had decrease in appetite, nausea without vomiting, and thin snake like stools.  States that the pain feels like her usual diverticular pain.  She is also s/p abdominal hysterectomy and denies vaginal discharge or bleeding.  Patient is a 56 y.o. female presenting with pelvic pain. The history is provided by the patient. No language interpreter was used.  Pelvic Pain This is a new problem. The current episode started in the past 7 days. The problem occurs constantly. The problem has been unchanged. Associated symptoms include abdominal pain, a change in bowel habit, a fever and nausea. Pertinent negatives include no anorexia, arthralgias, chest pain, chills, congestion, coughing, diaphoresis, fatigue, headaches, joint swelling, myalgias, neck pain, numbness, rash, sore throat, swollen glands, urinary symptoms, vertigo, visual change, vomiting or weakness. Nothing aggravates the symptoms. She has tried nothing for the symptoms. The treatment provided no relief.    Past Medical History  Diagnosis Date  . Arthritis     neck pain and numbness left digits Venetia Maxon)  . Depression     failed paxil, lexapro, cymbalta, zoloft, pristiq  . GERD (gastroesophageal reflux disease)   . Seasonal allergies   . HTN (hypertension)   . Migraines     occasional, stress related  . OSA (obstructive  sleep apnea)     mild, no CPAP  . Hypothyroid   . Colon polyps     last colonoscopy 2006? rpt due  . History of diverticulitis of colon     s/p resection of large intestine  . Postmenopausal     HRT compound, previously premarin  . Fatty liver     Past Surgical History  Procedure Date  . Tonsillectomy 1964  . Anterior fusion cervical spine 01/1999    cervical HNP  . Knee surgery 1995, 2011    torn menisci (MRI 2010 L)  . Total abdominal hysterectomy w/ bilateral salpingoophorectomy 06/2009    vag bleeding  . Colon resection 2008    2 ft removed, diverticulitis  . US echocardiography 08/2010    normal, EF 63%, mild valvular issues  . Spirometry 2011    normal  . Abdominal hysterectomy     Family History  Problem Relation Age of Onset  . Coronary artery disease Mother   . Arthritis Mother   . Coronary artery disease Father 15    CAD/MI  . Hypertension Father   . Diabetes Sister   . Hypertension Sister   . Hypertension Brother   . Cancer Neg Hx     History  Substance Use Topics  . Smoking status: Never Smoker   . Smokeless tobacco: Never Used  . Alcohol Use: Yes     rare    OB History    Grav Para Term Preterm Abortions TAB SAB Ect Mult Living                  Review of Systems  Constitutional: Positive  for fever. Negative for chills, diaphoresis and fatigue.  HENT: Negative for congestion, sore throat and neck pain.   Respiratory: Negative for cough.   Cardiovascular: Negative for chest pain.  Gastrointestinal: Positive for nausea, abdominal pain and change in bowel habit. Negative for vomiting, constipation, blood in stool and anorexia.  Genitourinary: Positive for pelvic pain. Negative for dysuria and hematuria.  Musculoskeletal: Negative for myalgias, joint swelling and arthralgias.  Skin: Negative for rash.  Neurological: Negative for vertigo, weakness, numbness and headaches.  All other systems reviewed and are negative.    Allergies   Cymbalta  Home Medications   Current Outpatient Rx  Name Route Sig Dispense Refill  . BUPROPION HCL ER (SR) 200 MG PO TB12 Oral Take 1 tablet (200 mg total) by mouth 2 (two) times daily. 180 tablet 3  . CARISOPRODOL 350 MG PO TABS Oral Take 1 tablet (350 mg total) by mouth at bedtime as needed. 90 tablet 3  . CITALOPRAM HYDROBROMIDE 20 MG PO TABS Oral Take 1 tablet (20 mg total) by mouth daily. 90 tablet 3  . IBUPROFEN 200 MG PO TABS Oral Take 600 mg by mouth as needed.     Marland Kitchen LEVOTHYROXINE SODIUM 100 MCG PO TABS Oral Take 1 tablet (100 mcg total) by mouth daily. 90 tablet 3  . LOSARTAN POTASSIUM 50 MG PO TABS Oral Take 1 tablet (50 mg total) by mouth daily. 90 tablet 3  . ALIVE WOMENS ENERGY PO Oral Take 1 tablet by mouth daily.      . OMEGA-3-ACID ETHYL ESTERS 1 G PO CAPS Oral Take 1 g by mouth at bedtime.    . OMEPRAZOLE 40 MG PO CPDR Oral Take 1 capsule (40 mg total) by mouth daily. 90 capsule 3    BP 120/64  Pulse 97  Temp 98.9 F (37.2 C) (Oral)  Resp 14  SpO2 96%  Physical Exam  Nursing note and vitals reviewed. Constitutional: She is oriented to person, place, and time. She appears well-developed and well-nourished. No distress.  HENT:  Head: Normocephalic and atraumatic.  Right Ear: External ear normal.  Left Ear: External ear normal.  Nose: Nose normal.  Mouth/Throat: Oropharynx is clear and moist. No oropharyngeal exudate.  Eyes: Conjunctivae are normal. Pupils are equal, round, and reactive to light. No scleral icterus.  Neck: Normal range of motion. Neck supple.  Cardiovascular: Normal rate, regular rhythm and normal heart sounds.  Exam reveals no gallop and no friction rub.   No murmur heard. Pulmonary/Chest: Effort normal and breath sounds normal. No respiratory distress. She has no wheezes. She has no rales. She exhibits no tenderness.  Abdominal: Soft. Bowel sounds are normal. She exhibits no distension and no mass. There is tenderness in the left lower  quadrant. There is guarding. There is no rebound and no CVA tenderness.    Musculoskeletal: Normal range of motion. She exhibits no edema and no tenderness.  Lymphadenopathy:    She has no cervical adenopathy.  Neurological: She is alert and oriented to person, place, and time. No cranial nerve deficit.  Skin: Skin is warm and dry. No rash noted. No erythema. No pallor.  Psychiatric: She has a normal mood and affect. Her behavior is normal. Judgment and thought content normal.    ED Course  Procedures (including critical care time)  Labs Reviewed  URINALYSIS, ROUTINE W REFLEX MICROSCOPIC - Abnormal; Notable for the following:    Leukocytes, UA TRACE (*)     All other components within normal limits  CBC - Abnormal; Notable for the following:    WBC 11.3 (*)     All other components within normal limits  DIFFERENTIAL - Abnormal; Notable for the following:    Neutro Abs 8.0 (*)     All other components within normal limits  POCT I-STAT, CHEM 8 - Abnormal; Notable for the following:    Creatinine, Ser 1.20 (*)     Glucose, Bld 105 (*)     All other components within normal limits  URINE MICROSCOPIC-ADD ON  URINE CULTURE   No results found. Results for orders placed during the hospital encounter of 02/20/12  URINALYSIS, ROUTINE W REFLEX MICROSCOPIC      Component Value Range   Color, Urine YELLOW  YELLOW   APPearance CLEAR  CLEAR   Specific Gravity, Urine 1.012  1.005 - 1.030   pH 6.0  5.0 - 8.0   Glucose, UA NEGATIVE  NEGATIVE mg/dL   Hgb urine dipstick NEGATIVE  NEGATIVE   Bilirubin Urine NEGATIVE  NEGATIVE   Ketones, ur NEGATIVE  NEGATIVE mg/dL   Protein, ur NEGATIVE  NEGATIVE mg/dL   Urobilinogen, UA 0.2  0.0 - 1.0 mg/dL   Nitrite NEGATIVE  NEGATIVE   Leukocytes, UA TRACE (*) NEGATIVE  CBC      Component Value Range   WBC 11.3 (*) 4.0 - 10.5 K/uL   RBC 4.12  3.87 - 5.11 MIL/uL   Hemoglobin 12.4  12.0 - 15.0 g/dL   HCT 16.1  09.6 - 04.5 %   MCV 90.0  78.0 - 100.0  fL   MCH 30.1  26.0 - 34.0 pg   MCHC 33.4  30.0 - 36.0 g/dL   RDW 40.9  81.1 - 91.4 %   Platelets 254  150 - 400 K/uL  DIFFERENTIAL      Component Value Range   Neutrophils Relative 71  43 - 77 %   Neutro Abs 8.0 (*) 1.7 - 7.7 K/uL   Lymphocytes Relative 22  12 - 46 %   Lymphs Abs 2.5  0.7 - 4.0 K/uL   Monocytes Relative 6  3 - 12 %   Monocytes Absolute 0.6  0.1 - 1.0 K/uL   Eosinophils Relative 1  0 - 5 %   Eosinophils Absolute 0.2  0.0 - 0.7 K/uL   Basophils Relative 0  0 - 1 %   Basophils Absolute 0.0  0.0 - 0.1 K/uL  URINE MICROSCOPIC-ADD ON      Component Value Range   Squamous Epithelial / LPF RARE  RARE   WBC, UA 0-2  <3 WBC/hpf   Bacteria, UA RARE  RARE  POCT I-STAT, CHEM 8      Component Value Range   Sodium 141  135 - 145 mEq/L   Potassium 3.8  3.5 - 5.1 mEq/L   Chloride 103  96 - 112 mEq/L   BUN 15  6 - 23 mg/dL   Creatinine, Ser 7.82 (*) 0.50 - 1.10 mg/dL   Glucose, Bld 956 (*) 70 - 99 mg/dL   Calcium, Ion 2.13  0.86 - 1.32 mmol/L   TCO2 23  0 - 100 mmol/L   Hemoglobin 13.3  12.0 - 15.0 g/dL   HCT 57.8  46.9 - 62.9 %   Ct Abdomen Pelvis W Contrast  02/20/2012  *RADIOLOGY REPORT*  Clinical Data: Left lower abdominal pain  CT ABDOMEN AND PELVIS WITH CONTRAST  Technique:  Multidetector CT imaging of the abdomen and pelvis was performed following the standard protocol  during bolus administration of intravenous contrast.  Contrast: OMNIPAQUE IOHEXOL 300 MG/ML  SOLN  Comparison: None.  Findings: Limited images through the lung bases demonstrate no significant appreciable abnormality. The heart size is within normal limits. No pleural or pericardial effusion.  Low attenuation of the liver suggests fatty infiltration.  No focal abnormality.  Unremarkable biliary system, spleen, pancreas, adrenal glands, kidneys.  No hydronephrosis or hydroureter.  Colonic anastomotic suture noted in the left lower quadrant.  There is a segment of descending/sigmoid colon junction with  pericolonic fat stranding superimposed on colonic diverticulosis.  No free intraperitoneal air or loculated fluid collection.  Appendix not identified however no right lower quadrant inflammation.  Postoperative changes of the anterior abdominal wall.  There is scattered atherosclerotic calcification of the aorta and its branches. No aneurysmal dilatation.  Thin-walled bladder.  Pelvic floor laxity.  Absent uterus.  No adnexal mass.  Multilevel degenerative changes of the imaged spine. No acute or aggressive appearing osseous lesion.  IMPRESSION: Diverticulitis involving a segment of sigmoid colon/descending colon junction.  Original Report Authenticated By: Waneta Martins, M.D.      Diverticulitis   MDM  Patient with history of diverticulitis presents tonight with similar pain and mild nausea without vomiting.  CT shows acute diverticulitis without abscess formation or perforation.  Patient is otherwise comfortable and stable so we believe that she can go home and try outpatient treatment.  She has been instructed to return with intractable pain, vomiting, fever, chills, or any worsening symptoms.  She will follow up with her PCP by the end of the week to make sure she is improving.  She has been seen by Dr. Nicanor Alcon and myself.       Izola Price Bryant, [place] 02/20/12 [phone]

## 2012-02-21 LAB — URINE CULTURE

## 2012-02-28 ENCOUNTER — Ambulatory Visit
Admission: RE | Admit: 2012-02-28 | Discharge: 2012-02-28 | Disposition: A | Discharge status: Home / Self Care | Payer: BC Managed Care – PPO | Source: Ambulatory Visit | Attending: Family Medicine | Admitting: Family Medicine

## 2012-02-28 DIAGNOSIS — Z1231 Encounter for screening mammogram for malignant neoplasm of breast: Secondary | ICD-10-CM | POA: Diagnosis not present

## 2012-03-02 ENCOUNTER — Encounter: Payer: Self-pay | Admitting: Gastroenterology

## 2012-03-02 ENCOUNTER — Ambulatory Visit (INDEPENDENT_AMBULATORY_CARE_PROVIDER_SITE_OTHER): Payer: BC Managed Care – PPO | Admitting: Family Medicine

## 2012-03-02 ENCOUNTER — Encounter: Payer: Self-pay | Admitting: Family Medicine

## 2012-03-02 VITALS — BP 138/69 | HR 74 | Temp 98.4°F | Wt 188.0 lb

## 2012-03-02 DIAGNOSIS — R109 Unspecified abdominal pain: Secondary | ICD-10-CM

## 2012-03-02 DIAGNOSIS — K5732 Diverticulitis of large intestine without perforation or abscess without bleeding: Secondary | ICD-10-CM | POA: Diagnosis not present

## 2012-03-02 DIAGNOSIS — K5792 Diverticulitis of intestine, part unspecified, without perforation or abscess without bleeding: Secondary | ICD-10-CM

## 2012-03-02 NOTE — Progress Notes (Signed)
  Subjective:    Patient ID: Brittany [name], female    DOB: 1955-09-12, 56 y.o.   MRN: [id number]  HPI CC: f/u ER visit for diverticulitis  eval at [facility] ER 02/20/2012 with sharp LLQ /left pelvic pain.  Records reviewed.  WBC 11.2, Cr 1.2.  CT scan showed acute diverticulitis of descending colon.  Treated with cipro and flagyl x 10 day course as well as percocets and phenergan for breakthrough pain.  Finished abx over weekend.  Rarely using percocets.  Since finished treatment, not feeling well.  Yesterday had pain in lower right quadrant, intense.  Sudden onset, lasted large part of afternoon.  Also with 1 wk h/o diarrhea described as loose stools 3-4 /day.  Noticing nausea with food, some trouble swallowing "food feels stuck in throat".  Has been eating regular diet, just avoiding seeds/popcorn.  Last colonoscopy was 2006, h/o colon polyps.  Due for rpt. H/o diverticulitis s/p 2 ft colon resection.  Past Medical History  Diagnosis Date  . Arthritis     neck pain and numbness left digits Venetia Maxon)  . Depression     failed paxil, lexapro, cymbalta, zoloft, pristiq  . GERD (gastroesophageal reflux disease)   . Seasonal allergies   . HTN (hypertension)   . Migraines     occasional, stress related  . OSA (obstructive sleep apnea)     mild, no CPAP  . Hypothyroid   . Colon polyps     last colonoscopy 2006? rpt due  . History of diverticulitis of colon     s/p resection of large intestine, rpt itis 02/2012  . Postmenopausal     HRT compound, previously premarin  . Fatty liver     Past Surgical History  Procedure Date  . Tonsillectomy 1964  . Anterior fusion cervical spine 01/1999    cervical HNP  . Knee surgery 1995, 2011    torn menisci (MRI 2010 L)  . Total abdominal hysterectomy w/ bilateral salpingoophorectomy 06/2009    vag bleeding  . Colon resection 2008    2 ft removed, diverticulitis  . US echocardiography 08/2010    normal, EF 63%, mild valvular issues  . Spirometry  2011    normal  . Abdominal hysterectomy     Review of Systems Per HPI    Objective:   Physical Exam  Nursing note and vitals reviewed. Constitutional: She appears well-developed and well-nourished. No distress.  HENT:  Head: Normocephalic and atraumatic.  Mouth/Throat: Oropharynx is clear and moist. No oropharyngeal exudate.  Cardiovascular: Normal rate, regular rhythm, normal heart sounds and intact distal pulses.   No murmur heard. Pulmonary/Chest: Effort normal and breath sounds normal. No respiratory distress. She has no wheezes. She has no rales.  Abdominal: Soft. Bowel sounds are normal. She exhibits no distension and no mass. There is no hepatosplenomegaly. There is tenderness (mild to deep palpation) in the right upper quadrant, right lower quadrant and left lower quadrant. There is no rebound, no guarding and no CVA tenderness.  Lymphadenopathy:    She has no cervical adenopathy.  Skin: Skin is warm and dry. No rash noted.  Psychiatric: She has a normal mood and affect.       Assessment & Plan:

## 2012-03-02 NOTE — Assessment & Plan Note (Signed)
Recent diverticulitis by CT scan at ER - completed flagyl and cipro course.   However hasn't made as speedy recovery as in past. Doubt smoldering diverticulitis.  Have recommended she change diet to clear liquid for next 1-2 days then slowly advance. Check blood work to eval for continued leukocytosis as well as other causes of abd pain. abd exam benign today. If continued pain, consider rpt CT scan to eval for complications of diverticulitis. Will also refer to GI for h/o continued diverticulitis despite colon resection as well as due for colonoscopy once recovered from acute flare.

## 2012-03-02 NOTE — Patient Instructions (Addendum)
I'm sorry you're not feeling well. Back down to clear liquid diet for next 1-2 days then slowly advance - low fiber diet for now until feeling better. Blood work today.  We will call you with results. Pass by Marion's office for referral to stomach doctor for diverticulitis and colonoscopy. If worsening pain or any fevers/chills, please let us know or seek urgent care.

## 2012-03-03 LAB — COMPREHENSIVE METABOLIC PANEL
ALT: 9 U/L (ref 0–35)
AST: 20 U/L (ref 0–37)
Calcium: 9.1 mg/dL (ref 8.4–10.5)
Chloride: 103 mEq/L (ref 96–112)
Creatinine, Ser: 1.1 mg/dL (ref 0.4–1.2)
Potassium: 4.1 mEq/L (ref 3.5–5.1)
Sodium: 138 mEq/L (ref 135–145)
Total Protein: 7.1 g/dL (ref 6.0–8.3)

## 2012-03-03 LAB — CBC WITH DIFFERENTIAL/PLATELET
Basophils Absolute: 0 10*3/uL (ref 0.0–0.1)
Eosinophils Absolute: 0.2 10*3/uL (ref 0.0–0.7)
Hemoglobin: 12.6 g/dL (ref 12.0–15.0)
Lymphocytes Relative: 25.9 % (ref 12.0–46.0)
Lymphs Abs: 1.8 10*3/uL (ref 0.7–4.0)
MCHC: 32.9 g/dL (ref 30.0–36.0)
Neutro Abs: 4.7 10*3/uL (ref 1.4–7.7)
RDW: 14.1 % (ref 11.5–14.6)

## 2012-03-04 ENCOUNTER — Encounter: Payer: Self-pay | Admitting: *Deleted

## 2012-03-09 HISTORY — PX: COLONOSCOPY: SHX174

## 2012-03-09 HISTORY — PX: ESOPHAGOGASTRODUODENOSCOPY: SHX1529

## 2012-03-18 ENCOUNTER — Ambulatory Visit (INDEPENDENT_AMBULATORY_CARE_PROVIDER_SITE_OTHER): Payer: Medicare Other | Admitting: Family Medicine

## 2012-03-18 ENCOUNTER — Encounter: Payer: Self-pay | Admitting: Family Medicine

## 2012-03-18 VITALS — BP 128/80 | HR 80 | Temp 98.4°F | Wt 187.0 lb

## 2012-03-18 DIAGNOSIS — T148XXA Other injury of unspecified body region, initial encounter: Secondary | ICD-10-CM | POA: Diagnosis not present

## 2012-03-18 DIAGNOSIS — S90569A Insect bite (nonvenomous), unspecified ankle, initial encounter: Secondary | ICD-10-CM | POA: Diagnosis not present

## 2012-03-18 DIAGNOSIS — S80862A Insect bite (nonvenomous), left lower leg, initial encounter: Secondary | ICD-10-CM

## 2012-03-18 DIAGNOSIS — W57XXXA Bitten or stung by nonvenomous insect and other nonvenomous arthropods, initial encounter: Secondary | ICD-10-CM | POA: Diagnosis not present

## 2012-03-18 MED ORDER — FLUOCINONIDE-E 0.05 % EX CREA: TOPICAL_CREAM | Freq: Two times a day (BID) | CUTANEOUS | Status: DC

## 2012-03-18 NOTE — Progress Notes (Signed)
Subjective:    Patient ID: Brittany [name], female    DOB: 1955/11/12, 56 y.o.   MRN: [id number]  HPI  56 yo female with unknown bug bite to her left leg this morning.  She was working outside, felt something bite her.  It felt itchy and she assumed it was a mosquito bite. A few moments later, she looked at it and it looked like a "target."  Very itchy.  Not painful.  No fever, nausea, vomiting or other rash.  Otherwise feels fine.  Has never had a bite like this in past.  Patient Active Problem List  Diagnosis  . Depression  . GERD (gastroesophageal reflux disease)  . HTN (hypertension)  . Migraines  . OSA (obstructive sleep apnea)  . Hypothyroid  . Hot flashes  . HLD (hyperlipidemia)  . Left knee pain  . Cervical spondylosis with myelopathy  . Colon polyp  . Healthcare maintenance  . Hearing loss  . Diverticulitis  . Insect bite of leg, left   Past Medical History  Diagnosis Date  . Arthritis     neck pain and numbness left digits Venetia Maxon)  . Depression     failed paxil, lexapro, cymbalta, zoloft, pristiq  . GERD (gastroesophageal reflux disease)   . Seasonal allergies   . HTN (hypertension)   . Migraines     occasional, stress related  . OSA (obstructive sleep apnea)     mild, no CPAP  . Hypothyroid   . Colon polyps     last colonoscopy 2006? rpt due  . History of diverticulitis of colon     s/p resection of large intestine, rpt itis 02/2012  . Postmenopausal     HRT compound, previously premarin  . Fatty liver 02/2012    on CT scan 02/2012   Past Surgical History  Procedure Date  . Tonsillectomy 1964  . Anterior fusion cervical spine 01/1999    cervical HNP  . Knee surgery 1995, 2011    torn menisci (MRI 2010 L)  . Total abdominal hysterectomy w/ bilateral salpingoophorectomy 06/2009    vag bleeding  . Colon resection 2008    2 ft removed, diverticulitis  . US echocardiography 08/2010    normal, EF 63%, mild valvular issues  . Spirometry 2011   normal  . Abdominal hysterectomy    History  Substance Use Topics  . Smoking status: Never Smoker   . Smokeless tobacco: Never Used  . Alcohol Use: No     rare   Family History  Problem Relation Age of Onset  . Coronary artery disease Mother   . Arthritis Mother   . Coronary artery disease Father 27    CAD/MI  . Hypertension Father   . Diabetes Sister   . Hypertension Sister   . Hypertension Brother   . Cancer Neg Hx    Allergies  Allergen Reactions  . Cymbalta (Duloxetine Hcl) Other (See Comments)    suicidality   Current Outpatient Prescriptions on File Prior to Visit  Medication Sig Dispense Refill  . buPROPion (WELLBUTRIN SR) 200 MG 12 hr tablet Take 1 tablet (200 mg total) by mouth 2 (two) times daily.  180 tablet  3  . carisoprodol (SOMA) 350 MG tablet Take 1 tablet (350 mg total) by mouth at bedtime as needed.  90 tablet  3  . citalopram (CELEXA) 20 MG tablet Take 1 tablet (20 mg total) by mouth daily.  90 tablet  3  . ibuprofen (ADVIL,MOTRIN) 200 MG tablet Take 600 mg  by mouth as needed.       Marland Kitchen levothyroxine (SYNTHROID, LEVOTHROID) 100 MCG tablet Take 1 tablet (100 mcg total) by mouth daily.  90 tablet  3  . losartan (COZAAR) 50 MG tablet Take 1 tablet (50 mg total) by mouth daily.  90 tablet  3  . Multiple Vitamins-Minerals (ALIVE WOMENS ENERGY PO) Take 1 tablet by mouth daily.        Marland Kitchen omega-3 acid ethyl esters (LOVAZA) 1 G capsule Take 1 g by mouth at bedtime.      Marland Kitchen omeprazole (PRILOSEC) 40 MG capsule Take 1 capsule (40 mg total) by mouth daily.  90 capsule  3   The PMH, PSH, Social History, Family History, Medications, and allergies have been reviewed in [facility], and have been updated if relevant.   Review of Systems See HPI    Objective:   Physical Exam  Constitutional: She appears well-developed. No distress.  HENT:  Head: Normocephalic.  Skin: Skin is warm and dry. She is not diaphoretic. No pallor.      BP 128/80  Pulse 80  Temp 98.4 F (36.9 C)   Wt 187 lb (84.823 kg)     Assessment & Plan:   1. Insect bite of leg, left   New- does not appear infected. No necrosis evident. Assume local/allergic reaction. Start antihistamines and lidex. See pt instructions for details.

## 2012-03-18 NOTE — Patient Instructions (Signed)
Good to see it. It does appear something bit you, maybe even a spider. It does not appear infected. Please take benadryl and or zyrtec for next 1-2 days. Keep leg elevated, you may use ice. Lidex twice daily for next several days until it resolves. Call us immediately if you develop fever, nausea or rash worsens.

## 2012-03-24 ENCOUNTER — Encounter: Payer: Self-pay | Admitting: Gastroenterology

## 2012-03-24 ENCOUNTER — Telehealth: Payer: Self-pay

## 2012-03-24 ENCOUNTER — Ambulatory Visit (INDEPENDENT_AMBULATORY_CARE_PROVIDER_SITE_OTHER): Payer: Medicare Other | Admitting: Gastroenterology

## 2012-03-24 ENCOUNTER — Other Ambulatory Visit: Payer: Self-pay

## 2012-03-24 VITALS — BP 128/70 | HR 80 | Ht 64.0 in | Wt 186.8 lb

## 2012-03-24 DIAGNOSIS — K5790 Diverticulosis of intestine, part unspecified, without perforation or abscess without bleeding: Secondary | ICD-10-CM

## 2012-03-24 DIAGNOSIS — Z9889 Other specified postprocedural states: Secondary | ICD-10-CM

## 2012-03-24 DIAGNOSIS — Z9049 Acquired absence of other specified parts of digestive tract: Secondary | ICD-10-CM

## 2012-03-24 DIAGNOSIS — K219 Gastro-esophageal reflux disease without esophagitis: Secondary | ICD-10-CM

## 2012-03-24 DIAGNOSIS — K5730 Diverticulosis of large intestine without perforation or abscess without bleeding: Secondary | ICD-10-CM

## 2012-03-24 DIAGNOSIS — Z8719 Personal history of other diseases of the digestive system: Secondary | ICD-10-CM

## 2012-03-24 MED ORDER — MOVIPREP 100 G PO SOLR: 1.0000 | Freq: Once | ORAL | Status: DC

## 2012-03-24 NOTE — Patient Instructions (Addendum)
We have scheduled you for your colonoscopy/Endoscopy Separate instructions have been given  Colonoscopy A colonoscopy is an exam to evaluate your entire colon. In this exam, your colon is cleansed. A long fiberoptic tube is inserted through your rectum and into your colon. The fiberoptic scope (endoscope) is a long bundle of enclosed and very flexible fibers. These fibers transmit light to the area examined and send images from that area to your caregiver. Discomfort is usually minimal. You may be given a drug to help you sleep (sedative) during or prior to the procedure. This exam helps to detect lumps (tumors), polyps, inflammation, and areas of bleeding. Your caregiver may also take a small piece of tissue (biopsy) that will be examined under a microscope. LET YOUR CAREGIVER KNOW ABOUT:   Allergies to food or medicine.   Medicines taken, including vitamins, herbs, eyedrops, over-the-counter medicines, and creams.   Use of steroids (by mouth or creams).   Previous problems with anesthetics or numbing medicines.   History of bleeding problems or blood clots.   Previous surgery.   Other health problems, including diabetes and kidney problems.   Possibility of pregnancy, if this applies.  BEFORE THE PROCEDURE   A clear liquid diet may be required for 2 days before the exam.   Ask your caregiver about changing or stopping your regular medications.   Liquid injections (enemas) or laxatives may be required.   A large amount of electrolyte solution may be given to you to drink over a short period of time. This solution is used to clean out your colon.   You should be present 60 minutes prior to your procedure or as directed by your caregiver.  AFTER THE PROCEDURE   If you received a sedative or pain relieving medication, you will need to arrange for someone to drive you home.   Occasionally, there is a little blood passed with the first bowel movement. Do not be concerned.  FINDING  OUT THE RESULTS OF YOUR TEST Not all test results are available during your visit. If your test results are not back during the visit, make an appointment with your caregiver to find out the results. Do not assume everything is normal if you have not heard from your caregiver or the medical facility. It is important for you to follow up on all of your test results. HOME CARE INSTRUCTIONS   It is not unusual to pass moderate amounts of gas and experience mild abdominal cramping following the procedure. This is due to air being used to inflate your colon during the exam. Walking or a warm pack on your belly (abdomen) may help.   You may resume all normal meals and activities after sedatives and medicines have worn off.   Only take over-the-counter or prescription medicines for pain, discomfort, or fever as directed by your caregiver. Do not use aspirin or blood thinners if a biopsy was taken. Consult your caregiver for medicine usage if biopsies were taken.  SEEK IMMEDIATE MEDICAL CARE IF:   You have a fever.   You pass large blood clots or fill a toilet with blood following the procedure. This may also occur 10 to 14 days following the procedure. This is more likely if a biopsy was taken.   You develop abdominal pain that keeps getting worse and cannot be relieved with medicine.  Document Released: 08/23/2000 Document Revised: 08/15/2011 Document Reviewed: 04/07/2008 [facility] Patient Information 2012 [place], [place]. Upper GI Endoscopy Upper GI endoscopy means using a flexible scope  to look at the esophagus, stomach, and upper small bowel. This is done to make a diagnosis in people with heartburn, abdominal pain, or abnormal bleeding. Sometimes an endoscope is needed to remove foreign bodies or food that become stuck in the esophagus; it can also be used to take biopsy samples. For the best results, do not eat or drink for 8 hours before having your upper endoscopy.  To perform the endoscopy, you  will probably be sedated and your throat will be numbed with a special spray. The endoscope is then slowly passed down your throat (this will not interfere with your breathing). An endoscopy exam takes 15 to 30 minutes to complete and there is no real pain. Patients rarely remember much about the procedure. The results of the test may take several days if a biopsy or other test is taken.  You may have a sore throat after an endoscopy exam. Serious complications are very rare. Stick to liquids and soft foods until your pain is better. Do not drive a car or operate any dangerous equipment for at least 24 hours after being sedated. SEEK IMMEDIATE MEDICAL CARE IF:   You have severe throat pain.   You have shortness of breath.   You have bleeding problems.   You have a fever.   You have difficulty recovering from your sedation.  Document Released: 10/03/2004 Document Revised: 08/15/2011 Document Reviewed: 08/28/2008 [facility] Patient Information 2012 [place], [place].  Diet for GERD or PUD Nutrition therapy can help ease the discomfort of gastroesophageal reflux disease (GERD) and peptic ulcer disease (PUD).  HOME CARE INSTRUCTIONS   Eat your meals slowly, in a relaxed setting.   Eat 5 to 6 small meals per day.   If a food causes distress, stop eating it for a period of time.  FOODS TO AVOID  Coffee, regular or decaffeinated.   Cola beverages, regular or low calorie.   Tea, regular or decaffeinated.   Pepper.   Cocoa.   High fat foods, including meats.   Butter, margarine, hydrogenated oil (trans fats).   Peppermint or spearmint (if you have GERD).   Fruits and vegetables if not tolerated.   Alcohol.   Nicotine (smoking or chewing). This is one of the most potent stimulants to acid production in the gastrointestinal tract.   Any food that seems to aggravate your condition.  If you have questions regarding your diet, ask your caregiver or a registered  dietitian. TIPS  Lying flat may make symptoms worse. Keep the head of your bed raised 6 to 9 inches (15 to 23 cm) by using a foam wedge or blocks under the legs of the bed.   Do not lay down until 3 hours after eating a meal.   Daily physical activity may help reduce symptoms.  MAKE SURE YOU:   Understand these instructions.   Will watch your condition.   Will get help right away if you are not doing well or get worse.  Document Released: 08/26/2005 Document Revised: 08/15/2011 Document Reviewed: 07/12/2011 [facility] Patient Information 2012 [place], [place].  Diverticulosis Diverticulosis is a common condition that develops when small pouches (diverticula) form in the wall of the colon. The risk of diverticulosis increases with age. It happens more often in people who eat a low-fiber diet. Most individuals with diverticulosis have no symptoms. Those individuals with symptoms usually experience abdominal pain, constipation, or loose stools (diarrhea). HOME CARE INSTRUCTIONS   Increase the amount of fiber in your diet as directed by your  caregiver or dietician. This may reduce symptoms of diverticulosis.   Your caregiver may recommend taking a dietary fiber supplement.   Drink at least 6 to 8 glasses of water each day to prevent constipation.   Try not to strain when you have a bowel movement.   Your caregiver may recommend avoiding nuts and seeds to prevent complications, although this is still an uncertain benefit.   Only take over-the-counter or prescription medicines for pain, discomfort, or fever as directed by your caregiver.  FOODS WITH HIGH FIBER CONTENT INCLUDE:  Fruits. Apple, peach, pear, tangerine, raisins, prunes.   Vegetables. Brussels sprouts, asparagus, broccoli, cabbage, carrot, cauliflower, romaine lettuce, spinach, summer squash, tomato, winter squash, zucchini.   Starchy Vegetables. Baked beans, kidney beans, lima beans, split peas, lentils, potatoes (with  skin).   Grains. Whole wheat bread, brown rice, bran flake cereal, plain oatmeal, white rice, shredded wheat, bran muffins.  SEEK IMMEDIATE MEDICAL CARE IF:   You develop increasing pain or severe bloating.   You have an oral temperature above 102 F (38.9 C), not controlled by medicine.   You develop vomiting or bowel movements that are bloody or black.  Document Released: 05/23/2004 Document Revised: 08/15/2011 Document Reviewed: 01/24/2010 [facility] Patient Information 2012 [place], [place].

## 2012-03-24 NOTE — Progress Notes (Signed)
History of Present Illness:  This is a 56 year old Caucasian female status post sigmoid colon resection 6 years ago in [place], [place]. She apparently had recurrent episodes of diverticulitis, and has done well since her surgery until one month ago when she developed acute left lower quadrant pain, was seen in the emergency room and CT scan showed mild left colon diverticulitis. She was treated with antibiotic therapy and currently is asymptomatic. Her stools currently are normal size, and she denies melena or hematochezia. The patient does relate intolerance to seeds, but otherwise tries to follow a high fiber diet. She also has chronic GERD has been on Prilosec 20 mg a day for many years. The patient cannot require previous endoscopic exam. Also she has chronic anxiety and depression is on Celexa, Wellbutrin, soma, and Synthroid for chronic hypothyroidism. Family history is remarkable for gallstones and multiple family members. Patient does not use alcohol, cigarettes, but does use when necessary Advil.  I have reviewed this patient's present history, medical and surgical past history, allergies and medications.     ROS: The remainder of the 10 point ROS is negative     Physical Exam: Healthy-appearing patient in no distress. Blood pressure 128/70, pulse 80 and regular, and weight 186 with a BMI of 32.06. General well developed well nourished patient in no acute distress, appearing her stated age Eyes PERRLA, no icterus, fundoscopic exam per opthamologist Skin no lesions noted Neck supple, no adenopathy, no thyroid enlargement, no tenderness Chest clear to percussion and auscultation Heart no significant murmurs, gallops or rubs noted Abdomen no hepatosplenomegaly masses or tenderness, BS normal.  Extremities no acute joint lesions, edema, phlebitis or evidence of cellulitis. Neurologic patient oriented x 3, cranial nerves intact, no focal neurologic deficits noted. Psychological mental  status normal and normal affect.  Assessment and plan: Recent episode of acute diverticulitis appropriately treated in a 56 year old patient who has had previous left hemicolectomy. I've schedule colonoscopy for colon cancer screening purposes in several weeks' time. I have asked her to follow a high fiber diet and to take daily Metamucil with liberal by mouth fluids. She is to continue her PPI therapy, and we will do endoscopic screening for Barrett's mucosa. Lab review shows no other specific abnormalities. I also reviewed her CT scan and radiographs today.  Encounter Diagnoses  Name Primary?  . GERD (gastroesophageal reflux disease) Yes  . Diverticulosis

## 2012-03-24 NOTE — Telephone Encounter (Signed)
Ms Brittany [name] left v/m pharmacist had dose and therapy question. Ref # [id number]. I called [facility] back spoke with Roe Coombs pharmacist; Lidex E 0.05% cream was filled at local pharmacy and [facility] on [facility]/12. Is not on pts current med list but is on history med list. Jacki Cones said to cancel order with [facility] and send note to Dr Dayton Martes to see if she wants pt to have med.Please advise.

## 2012-03-25 NOTE — Telephone Encounter (Signed)
It must have been sent in error to [facility]. I apologize, please disregard.

## 2012-04-01 ENCOUNTER — Encounter: Payer: Self-pay | Admitting: Gastroenterology

## 2012-04-01 ENCOUNTER — Ambulatory Visit (AMBULATORY_SURGERY_CENTER): Payer: Medicare Other | Admitting: Gastroenterology

## 2012-04-01 VITALS — BP 129/69 | HR 95 | Temp 98.1°F | Resp 19 | Ht 64.0 in | Wt 186.0 lb

## 2012-04-01 DIAGNOSIS — K219 Gastro-esophageal reflux disease without esophagitis: Secondary | ICD-10-CM

## 2012-04-01 DIAGNOSIS — K5732 Diverticulitis of large intestine without perforation or abscess without bleeding: Secondary | ICD-10-CM

## 2012-04-01 DIAGNOSIS — K2900 Acute gastritis without bleeding: Secondary | ICD-10-CM | POA: Diagnosis not present

## 2012-04-01 DIAGNOSIS — G4733 Obstructive sleep apnea (adult) (pediatric): Secondary | ICD-10-CM | POA: Diagnosis not present

## 2012-04-01 DIAGNOSIS — F329 Major depressive disorder, single episode, unspecified: Secondary | ICD-10-CM | POA: Diagnosis not present

## 2012-04-01 DIAGNOSIS — Z9889 Other specified postprocedural states: Secondary | ICD-10-CM | POA: Diagnosis not present

## 2012-04-01 DIAGNOSIS — I10 Essential (primary) hypertension: Secondary | ICD-10-CM | POA: Diagnosis not present

## 2012-04-01 DIAGNOSIS — E079 Disorder of thyroid, unspecified: Secondary | ICD-10-CM | POA: Diagnosis not present

## 2012-04-01 DIAGNOSIS — K5730 Diverticulosis of large intestine without perforation or abscess without bleeding: Secondary | ICD-10-CM

## 2012-04-01 DIAGNOSIS — Z9049 Acquired absence of other specified parts of digestive tract: Secondary | ICD-10-CM

## 2012-04-01 DIAGNOSIS — K5792 Diverticulitis of intestine, part unspecified, without perforation or abscess without bleeding: Secondary | ICD-10-CM

## 2012-04-01 MED ORDER — SODIUM CHLORIDE 0.9 % IV SOLN: 500.0000 mL | INTRAVENOUS | Status: DC

## 2012-04-01 NOTE — Op Note (Signed)
 [facility] [address] [place], [place]  [address]  COLONOSCOPY PROCEDURE REPORT  PATIENT:  [name], Brittany  MR#:  [id number] BIRTHDATE:  [birth year], 56 yrs. old  GENDER:  female ENDOSCOPIST:  Vania Rea. Jarold Motto, MD, [facility] REF. BY: PROCEDURE DATE:  04/01/2012 PROCEDURE:  Colonoscopy 19147 ASA CLASS:  Class II INDICATIONS:  Colorectal cancer screening, average risk RECENT DIVERTICULITIS MEDICATIONS:   propofol (Diprivan) 200 mg IV  DESCRIPTION OF PROCEDURE:   After the risks and benefits and of the procedure were explained, informed consent was obtained. Digital rectal exam was performed and revealed no abnormalities. The LB CF-H180AL [id number] endoscope was introduced through the anus and advanced to the anastomosis.  The quality of the prep was excellent, using MoviPrep.  The instrument was then slowly withdrawn as the colon was fully examined. <<PROCEDUREIMAGES>>  FINDINGS:  There was a surgical anastomosis. ANASTOMOSIS AT 40 CM. LEVEL.WIDELY PATENT AND NORMAL.SCATTERED RESIDUAL TICS NOTED,NOINFLAMMATION OR STRICTURE NOTED.  No polyps or cancers were seen.   Retroflexed views in the rectum revealed no abnormalities.    The scope was then withdrawn from the patient and the procedure completed.  COMPLICATIONS:  None ENDOSCOPIC IMPRESSION: 1) Anastomosis 2) No polyps or cancers PARTIAL COLECTOMY AND SCATTERED DIVERTICULAE. RECOMMENDATIONS: 1) Continue current medications 2) Continue current colorectal screening recommendations for "routine risk" patients with a repeat colonoscopy in 10 years.  REPEAT EXAM:  No  ______________________________ Vania Rea. Jarold Motto, MD, [facility]  CC:  Eustaquio Boyden MD  n. Rosalie DoctorMarland Kitchen   Vania Rea. Adriaan Maltese at 04/01/2012 02:41 PM  Clydell Hakim, [phone]

## 2012-04-01 NOTE — Op Note (Signed)
[facility] [address] [place], [place]  [address]  ENDOSCOPY PROCEDURE REPORT  PATIENT:  Brittany [name], Brittany [name]  MR#:  [id number] BIRTHDATE:  April 30, 1956, 56 yrs. old  GENDER:  female  ENDOSCOPIST:  Vania Rea. Jarold Motto, MD, [facility] Referred by:  PROCEDURE DATE:  04/01/2012 PROCEDURE:  EGD with biopsy for H. pylori 19147 ASA CLASS:  Class II INDICATIONS:  GERD R/O BARRETT'S MUCOSA  MEDICATIONS:   There was residual sedation effect present from prior procedure., propofol (Diprivan) 200 mg IV, glycopyrrolate (Robinal) 0.2 mg IV TOPICAL ANESTHETIC:  Cetacaine Spray  DESCRIPTION OF PROCEDURE:   After the risks and benefits of the procedure were explained, informed consent was obtained.  The LB GIF-H180 [id number] endoscope was introduced through the mouth and advanced to the second portion of the duodenum.  The instrument was slowly withdrawn as the mucosa was fully examined. <<PROCEDUREIMAGES>>  Moderate gastritis was found in the antrum. CLO BX. DONE.MILD ERYTHEMA AND EROSIONS  There were multiple polyps identified. BENIGN FUNDAL HYPERPLASTIC POLYPS NOTED.    Retroflexed views revealed no abnormalities.    The scope was then withdrawn from the patient and the procedure completed.  COMPLICATIONS:  None  ENDOSCOPIC IMPRESSION: 1) Moderate gastritis in the antrum R/O H.PYLORI.NO LAREG HH OR BARRETT'S MUCOSA NOTED. RECOMMENDATIONS: 1) Await biopsy results 2) Rx CLO if positive 3) continue current medications  ______________________________ Vania Rea. Jarold Motto, MD, [facility]  CC:  Eustaquio Boyden MD  n. Rosalie DoctorMarland Kitchen   Vania Rea. Patterson at 04/01/2012 02:48 PM  Clydell Hakim, [phone]

## 2012-04-01 NOTE — Progress Notes (Signed)
Patient did not experience any of the following events: a burn prior to discharge; a fall within the facility; wrong site/side/patient/procedure/implant event; or a hospital transfer or hospital admission upon discharge from the facility. (G8907) Patient did not have preoperative order for IV antibiotic SSI prophylaxis. (G8918)  

## 2012-04-01 NOTE — Patient Instructions (Addendum)
YOU HAD AN ENDOSCOPIC PROCEDURE TODAY AT THE [facility]: Refer to the procedure report that was given to you for any specific questions about what was found during the examination.  If the procedure report does not answer your questions, please call your gastroenterologist to clarify.  If you requested that your care partner not be given the details of your procedure findings, then the procedure report has been included in a sealed envelope for you to review at your convenience later.  YOU SHOULD EXPECT: Some feelings of bloating in the abdomen. Passage of more gas than usual.  Walking can help get rid of the air that was put into your GI tract during the procedure and reduce the bloating. If you had a lower endoscopy (such as a colonoscopy or flexible sigmoidoscopy) you may notice spotting of blood in your stool or on the toilet paper. If you underwent a bowel prep for your procedure, then you may not have a normal bowel movement for a few days.  DIET: Your first meal following the procedure should be a light meal and then it is ok to progress to your normal diet.  A half-sandwich or bowl of soup is an example of a good first meal.  Heavy or fried foods are harder to digest and may make you feel nauseous or bloated.  Likewise meals heavy in dairy and vegetables can cause extra gas to form and this can also increase the bloating.  Drink plenty of fluids but you should avoid alcoholic beverages for 24 hours.  ACTIVITY: Your care partner should take you home directly after the procedure.  You should plan to take it easy, moving slowly for the rest of the day.  You can resume normal activity the day after the procedure however you should NOT DRIVE or use heavy machinery for 24 hours (because of the sedation medicines used during the test).    SYMPTOMS TO REPORT IMMEDIATELY: A gastroenterologist can be reached at any hour.  During normal business hours, 8:30 AM to 5:00 PM Monday through Friday,  call [phone].  After hours and on weekends, please call the GI answering service at [phone] who will take a message and have the physician on call contact you.   Following lower endoscopy (colonoscopy or flexible sigmoidoscopy):  Excessive amounts of blood in the stool  Significant tenderness or worsening of abdominal pains  Swelling of the abdomen that is new, acute  Fever of 100F or higher  Following upper endoscopy (EGD)  Vomiting of blood or coffee ground material  New chest pain or pain under the shoulder blades  Painful or persistently difficult swallowing  New shortness of breath  Fever of 100F or higher  Black, tarry-looking stools  FOLLOW UP: If any biopsies were taken you will be contacted by phone or by letter within the next 1-3 weeks.  Call your gastroenterologist if you have not heard about the biopsies in 3 weeks.  Our staff will call the home number listed on your records the next business day following your procedure to check on you and address any questions or concerns that you may have at that time regarding the information given to you following your procedure. This is a courtesy call and so if there is no answer at the home number and we have not heard from you through the emergency physician on call, we will assume that you have returned to your regular daily activities without incident.  SIGNATURES/CONFIDENTIALITY: You and/or your care   partner have signed paperwork which will be entered into your electronic medical record.  These signatures attest to the fact that that the information above on your After Visit Summary has been reviewed and is understood.  Full responsibility of the confidentiality of this discharge information lies with you and/or your care-partner.  

## 2012-04-02 ENCOUNTER — Telehealth: Payer: Self-pay

## 2012-04-02 LAB — HELICOBACTER PYLORI SCREEN-BIOPSY: UREASE: NEGATIVE

## 2012-04-02 NOTE — Telephone Encounter (Signed)
  Follow up Call-  Call back number 04/01/2012  Post procedure Call Back phone  # [phone]  Permission to leave phone message Yes     Patient questions:  Do you have a fever, pain , or abdominal swelling? no Pain Score  0 *  Have you tolerated food without any problems? yes  Have you been able to return to your normal activities? yes  Do you have any questions about your discharge instructions: Diet   no Medications  no Follow up visit  no  Do you have questions or concerns about your Care? no  Actions: * If pain score is 4 or above: No action needed, pain <4.  Per the pt, "I did fine". Maw

## 2012-04-03 ENCOUNTER — Encounter: Payer: Self-pay | Admitting: Gastroenterology

## 2012-04-04 ENCOUNTER — Encounter: Payer: Self-pay | Admitting: Family Medicine

## 2012-06-24 ENCOUNTER — Other Ambulatory Visit: Payer: Self-pay | Admitting: Family Medicine

## 2012-07-10 DIAGNOSIS — M4850XA Collapsed vertebra, not elsewhere classified, site unspecified, initial encounter for fracture: Secondary | ICD-10-CM

## 2012-07-10 HISTORY — DX: Collapsed vertebra, not elsewhere classified, site unspecified, initial encounter for fracture: M48.50XA

## 2012-07-18 ENCOUNTER — Emergency Department (HOSPITAL_COMMUNITY): Payer: Medicare Other

## 2012-07-18 ENCOUNTER — Emergency Department (HOSPITAL_COMMUNITY)
Admission: EM | Admit: 2012-07-18 | Discharge: 2012-07-18 | Disposition: A | Discharge status: Home Health | Payer: Medicare Other | Attending: Emergency Medicine | Admitting: Emergency Medicine

## 2012-07-18 ENCOUNTER — Encounter (HOSPITAL_COMMUNITY): Payer: Self-pay | Admitting: *Deleted

## 2012-07-18 DIAGNOSIS — S32009A Unspecified fracture of unspecified lumbar vertebra, initial encounter for closed fracture: Secondary | ICD-10-CM | POA: Insufficient documentation

## 2012-07-18 DIAGNOSIS — E039 Hypothyroidism, unspecified: Secondary | ICD-10-CM | POA: Diagnosis not present

## 2012-07-18 DIAGNOSIS — G43909 Migraine, unspecified, not intractable, without status migrainosus: Secondary | ICD-10-CM | POA: Insufficient documentation

## 2012-07-18 DIAGNOSIS — IMO0002 Reserved for concepts with insufficient information to code with codable children: Secondary | ICD-10-CM | POA: Diagnosis not present

## 2012-07-18 DIAGNOSIS — Z8719 Personal history of other diseases of the digestive system: Secondary | ICD-10-CM | POA: Insufficient documentation

## 2012-07-18 DIAGNOSIS — Y929 Unspecified place or not applicable: Secondary | ICD-10-CM | POA: Insufficient documentation

## 2012-07-18 DIAGNOSIS — X500XXA Overexertion from strenuous movement or load, initial encounter: Secondary | ICD-10-CM | POA: Insufficient documentation

## 2012-07-18 DIAGNOSIS — G4730 Sleep apnea, unspecified: Secondary | ICD-10-CM | POA: Insufficient documentation

## 2012-07-18 DIAGNOSIS — F3289 Other specified depressive episodes: Secondary | ICD-10-CM | POA: Insufficient documentation

## 2012-07-18 DIAGNOSIS — F329 Major depressive disorder, single episode, unspecified: Secondary | ICD-10-CM | POA: Insufficient documentation

## 2012-07-18 DIAGNOSIS — K219 Gastro-esophageal reflux disease without esophagitis: Secondary | ICD-10-CM | POA: Diagnosis not present

## 2012-07-18 DIAGNOSIS — M119 Crystal arthropathy, unspecified: Secondary | ICD-10-CM | POA: Diagnosis not present

## 2012-07-18 DIAGNOSIS — I10 Essential (primary) hypertension: Secondary | ICD-10-CM | POA: Diagnosis not present

## 2012-07-18 DIAGNOSIS — Z79899 Other long term (current) drug therapy: Secondary | ICD-10-CM | POA: Insufficient documentation

## 2012-07-18 DIAGNOSIS — Y9389 Activity, other specified: Secondary | ICD-10-CM | POA: Insufficient documentation

## 2012-07-18 DIAGNOSIS — S32000A Wedge compression fracture of unspecified lumbar vertebra, initial encounter for closed fracture: Secondary | ICD-10-CM

## 2012-07-18 DIAGNOSIS — K5732 Diverticulitis of large intestine without perforation or abscess without bleeding: Secondary | ICD-10-CM | POA: Diagnosis not present

## 2012-07-18 MED ORDER — DIAZEPAM 5 MG PO TABS: 5.0000 mg | ORAL_TABLET | Freq: Two times a day (BID) | ORAL | Status: DC

## 2012-07-18 MED ORDER — OXYCODONE-ACETAMINOPHEN 5-325 MG PO TABS: 1.0000 | ORAL_TABLET | Freq: Four times a day (QID) | ORAL | Status: DC | PRN

## 2012-07-18 MED ORDER — DIAZEPAM 5 MG PO TABS
5.0000 mg | ORAL_TABLET | Freq: Once | ORAL | Status: AC
  Administered 2012-07-18: 5 mg via ORAL
  Filled 2012-07-18: qty 1

## 2012-07-18 MED ORDER — HYDROMORPHONE HCL PF 2 MG/ML IJ SOLN
2.0000 mg | Freq: Once | INTRAMUSCULAR | Status: AC
  Administered 2012-07-18: 2 mg via INTRAMUSCULAR
  Filled 2012-07-18: qty 1

## 2012-07-18 MED ORDER — ONDANSETRON 4 MG PO TBDP
4.0000 mg | ORAL_TABLET | Freq: Once | ORAL | Status: AC
  Administered 2012-07-18: 4 mg via ORAL
  Filled 2012-07-18: qty 1

## 2012-07-18 NOTE — ED Notes (Signed)
States her pain is much better but she feels slightly nauseated. meds given per order.  Family member at bedside

## 2012-07-18 NOTE — ED Notes (Signed)
Pt reports last week she was bending over to pick up a bottle in the shower when she felt a pop and has had continued pain to back, middle lower back pain. Exacerbated by certain movements. Able to wiggle toes,. Moves all extremities. No bowel or bladder issues.

## 2012-07-18 NOTE — ED Provider Notes (Addendum)
History    This chart was scribed for Brittany Sprout, MD, MD by Smitty Pluck, ED Scribe. The patient was seen in room TR05C and the patient's care was started at 4:11PM.     CSN: [id number]  Arrival date & time 07/18/12  1524       Chief Complaint  Patient presents with  . Back Pain    (Consider location/radiation/quality/duration/timing/severity/associated sxs/prior treatment) The history is provided by the patient. No language interpreter was used.   Brittany [name] is a 56 y.o. female who presents to the Emergency Department complaining of constant, moderate lower back pain onset 1 week ago. Pt reports that she was in shower and bent over to pick up bottle and heard a "pop." Pt reports that she has taken ibuprofen and used ice packs without relief. Denies radiation of pain. Reports that movement of legs aggravates the pain. Pt denies any urinary or bowel symptoms. Reports hx of cervical spine pain but denies hx of lower back pain.     Past Medical History  Diagnosis Date  . Arthritis     neck pain and numbness left digits Venetia Maxon)  . Depression     failed paxil, lexapro, cymbalta, zoloft, pristiq  . GERD (gastroesophageal reflux disease)   . Seasonal allergies   . HTN (hypertension)   . Migraines     occasional, stress related  . OSA (obstructive sleep apnea)     mild, no CPAP  . Hypothyroid   . Colon polyps     last colonoscopy 2006? rpt due  . History of diverticulitis of colon     s/p resection of large intestine, rpt itis 02/2012  . Postmenopausal     HRT compound, previously premarin  . Fatty liver 02/2012    on CT scan 02/2012  . Diverticulitis     Past Surgical History  Procedure Date  . Tonsillectomy 1964  . Anterior fusion cervical spine 01/1999    cervical HNP  . Knee surgery 1995, 2011    torn menisci (MRI 2010 L)  . Total abdominal hysterectomy w/ bilateral salpingoophorectomy 06/2009    vag bleeding  . Colon resection 2008    2 ft removed,  diverticulitis  . US echocardiography 08/2010    normal, EF 63%, mild valvular issues  . Spirometry 2011    normal  . Abdominal hysterectomy   . Colonoscopy 03/2012    partial colectomy, scattered diverticulae Jarold Motto), rpt 10 yrs  . Esophagogastroduodenoscopy 03/2012    mod gastritis, benign polyps, H pylori neg    Family History  Problem Relation Age of Onset  . Coronary artery disease Mother   . Arthritis Mother   . Heart disease Mother   . Coronary artery disease Father 25    CAD/MI  . Hypertension Father   . Heart disease Father   . Diabetes Sister   . Hypertension Sister   . Hypertension Brother   . Cancer Neg Hx     History  Substance Use Topics  . Smoking status: Never Smoker   . Smokeless tobacco: Never Used  . Alcohol Use: No     Comment: rare    OB History    Grav Para Term Preterm Abortions TAB SAB Ect Mult Living                  Review of Systems  Constitutional: Negative for chills.  Respiratory: Negative for shortness of breath.   Gastrointestinal: Negative for nausea and vomiting.  All other systems reviewed  and are negative.    Allergies  Cymbalta  Home Medications   Current Outpatient Rx  Name  Route  Sig  Dispense  Refill  . BUPROPION HCL ER (SR) 200 MG PO TB12   Oral   Take 1 tablet (200 mg total) by mouth 2 (two) times daily.   180 tablet   3   . CALCIUM CARBONATE 1250 MG PO TABS   Oral   Take 1 tablet by mouth daily.         Marland Kitchen CARISOPRODOL 350 MG PO TABS   Oral   Take 1 tablet (350 mg total) by mouth at bedtime as needed.   90 tablet   3   . CHOLECALCIFEROL 400 UNITS PO TABS   Oral   Take 400 Units by mouth 2 (two) times daily.         Marland Kitchen CITALOPRAM HYDROBROMIDE 20 MG PO TABS   Oral   Take 1 tablet (20 mg total) by mouth daily.   90 tablet   3   . IBUPROFEN 200 MG PO TABS   Oral   Take 600 mg by mouth as needed.          Marland Kitchen LEVOTHYROXINE SODIUM 100 MCG PO TABS   Oral   Take 1 tablet (100 mcg total) by  mouth daily.   90 tablet   3   . LOSARTAN POTASSIUM 50 MG PO TABS   Oral   Take 1 tablet (50 mg total) by mouth daily.   90 tablet   3   . ALIVE WOMENS ENERGY PO   Oral   Take 1 tablet by mouth daily.           . OMEGA-3-ACID ETHYL ESTERS 1 G PO CAPS   Oral   Take 1 g by mouth at bedtime.         . OMEPRAZOLE 40 MG PO CPDR   Oral   Take 40 mg by mouth daily.           BP 146/92  Pulse 85  Temp 98.4 F (36.9 C) (Oral)  Resp 22  SpO2 98%  Physical Exam  Nursing note and vitals reviewed. Constitutional: She is oriented to person, place, and time. She appears well-developed and well-nourished. No distress.  HENT:  Head: Normocephalic and atraumatic.  Eyes: EOM are normal.  Neck: Neck supple. No tracheal deviation present.  Cardiovascular: Normal rate.   Pulmonary/Chest: Effort normal. No respiratory distress.  Abdominal: Soft. Bowel sounds are normal. She exhibits no distension. There is no tenderness.  Musculoskeletal:       Lumbar tenderness Right para-lumbar tenderness Positive straight leg raise   Neurological: She is alert and oriented to person, place, and time.  Skin: Skin is warm and dry.  Psychiatric: She has a normal mood and affect. Her behavior is normal.    ED Course  Procedures (including critical care time) DIAGNOSTIC STUDIES: Oxygen Saturation is 98% on room air, normal by my interpretation.    COORDINATION OF CARE: 4:13 PM Discussed ED treatment with pt  4:18 PM Ordered:    . diazepam  5 mg Oral Once  .  HYDROmorphone (DILAUDID) injection  2 mg Intramuscular Once  . ondansetron  4 mg Oral Once           Labs Reviewed - No data to display Dg Lumbar Spine Complete  07/18/2012  *RADIOLOGY REPORT*  Clinical Data: The ending injury with pain.  LUMBAR SPINE - COMPLETE 4+ VIEW  Comparison: CT scan 02/20/2012  Findings: There is a superior endplate fracture at L1 with loss of height of about 20%, new since the previous CT.  No  apparent retropulsion.  The other levels are negative.  There is lower lumbar facet degeneration incidentally noted.  IMPRESSION: Superior endplate compression fracture at L1 with loss of height of approximately 20% and no apparent retropulsion.  New since the CT scan of June.   Original Report Authenticated By: Paulina Fusi, M.D.      1. Lumbar compression fracture       MDM   Pt with back pain that started abruptly after bending over with persistent pain since.  No neurovascular compromise and no incontinence.  Pt has no infectious sx, hx of CA  or other red flags concerning for pathologic back pain.  Pt is able to ambulate but is painful.  Normal strength and reflexes on exam.  Denies trauma. Will give pt pain control and to return for developement of above sx.  5:39 PM Plain films show compression fx of L1 with 20% height reduction.  Pt given results and pain control. WIll f/u with her doctor for possible vertebroplasty.  I personally performed the services described in this documentation, which was scribed in my presence.  The recorded information has been reviewed and considered.   Brittany Sprout, MD 07/18/12 1626  Brittany Sprout, MD 07/18/12 1610  Brittany Sprout, MD 07/18/12 1744  Brittany Sprout, MD 07/18/12 1745

## 2012-07-18 NOTE — ED Notes (Signed)
States she feels better and is ready to go home. Now rates pain at 2/10. Pt to car via wc

## 2012-07-19 ENCOUNTER — Encounter: Payer: Self-pay | Admitting: Family Medicine

## 2012-07-20 ENCOUNTER — Ambulatory Visit (INDEPENDENT_AMBULATORY_CARE_PROVIDER_SITE_OTHER): Payer: Medicare Other | Admitting: Family Medicine

## 2012-07-20 ENCOUNTER — Encounter: Payer: Self-pay | Admitting: Family Medicine

## 2012-07-20 VITALS — BP 130/80 | HR 88 | Temp 98.0°F | Ht 64.0 in | Wt 187.8 lb

## 2012-07-20 DIAGNOSIS — S32009A Unspecified fracture of unspecified lumbar vertebra, initial encounter for closed fracture: Secondary | ICD-10-CM | POA: Diagnosis not present

## 2012-07-20 DIAGNOSIS — S32010A Wedge compression fracture of first lumbar vertebra, initial encounter for closed fracture: Secondary | ICD-10-CM

## 2012-07-20 MED ORDER — OXYCODONE-ACETAMINOPHEN 10-325 MG PO TABS: 1.0000 | ORAL_TABLET | Freq: Four times a day (QID) | ORAL | Status: DC | PRN

## 2012-07-20 MED ORDER — DIAZEPAM 5 MG PO TABS: 5.0000 mg | ORAL_TABLET | Freq: Two times a day (BID) | ORAL | Status: DC | PRN

## 2012-07-20 NOTE — Patient Instructions (Signed)
Vitamin D: 2000 units Calcium 1500 mg daily

## 2012-07-20 NOTE — Progress Notes (Signed)
Nature conservation officer at [facility] [address] [place] [address] Phone: [phone] Fax: [phone]  Date:  07/20/2012   Name:  Brittany [name]   DOB:  08/30/1956   MRN:  [id number] Gender: female Age: 56 y.o.  PCP:  Eustaquio Boyden, MD  Evaluating MD: Hannah Beat, MD   Chief Complaint: Follow-up   History of Present Illness:  Brittany [name] is a 56 y.o. pleasant patient who presents with the following:  Twisting and bending down, was doing some heat and ice, could not stand the pain, a little over a week ago. Pain medication and muscle spasms that are really bad. Felt a "pop" in her lower back, and was in a lot of pain. Ultimately went to the ER and found to have a compression fx at L1 on xray. Significant pain and limitation with motion.  DOI 07/11/2012  Dg Lumbar Spine Complete  07/18/2012  *RADIOLOGY REPORT*  Clinical Data: The ending injury with pain.  LUMBAR SPINE - COMPLETE 4+ VIEW  Comparison: CT scan 02/20/2012  Findings: There is a superior endplate fracture at L1 with loss of height of about 20%, new since the previous CT.  No apparent retropulsion.  The other levels are negative.  There is lower lumbar facet degeneration incidentally noted.  IMPRESSION: Superior endplate compression fracture at L1 with loss of height of approximately 20% and no apparent retropulsion.  New since the CT scan of June.   Original Report Authenticated By: Paulina Fusi, M.D.     Patient Active Problem List  Diagnosis  . Depression  . GERD (gastroesophageal reflux disease)  . HTN (hypertension)  . Migraines  . OSA (obstructive sleep apnea)  . Hypothyroid  . Hot flashes  . HLD (hyperlipidemia)  . Left knee pain  . Cervical spondylosis with myelopathy  . Colon polyp  . Healthcare maintenance  . Hearing loss  . Diverticulitis  . Insect bite of leg, left    Past Medical History  Diagnosis Date  . Arthritis     neck pain and numbness left digits Venetia [name])  . Depression      failed paxil, lexapro, cymbalta, zoloft, pristiq  . GERD (gastroesophageal reflux disease)   . Seasonal allergies   . HTN (hypertension)   . Migraines     occasional, stress related  . OSA (obstructive sleep apnea)     mild, no CPAP  . Hypothyroid   . Colon polyps     last colonoscopy 2006? rpt due  . History of diverticulitis of colon     s/p resection of large intestine, rpt itis 02/2012  . Postmenopausal     HRT compound, previously premarin  . Fatty liver 02/2012    on CT scan 02/2012  . Diverticulitis   . Vertebral compression fracture 07/2012    L1 20%    Past Surgical History  Procedure Date  . Tonsillectomy 1964  . Anterior fusion cervical spine 01/1999    cervical HNP  . Knee surgery 1995, 2011    torn menisci (MRI 2010 L)  . Total abdominal hysterectomy w/ bilateral salpingoophorectomy 06/2009    vag bleeding  . Colon resection 2008    2 ft removed, diverticulitis  . US echocardiography 08/2010    normal, EF 63%, mild valvular issues  . Spirometry 2011    normal  . Abdominal hysterectomy   . Colonoscopy 03/2012    partial colectomy, scattered diverticulae Jarold Motto), rpt 10 yrs  . Esophagogastroduodenoscopy 03/2012    mod gastritis,  benign polyps, H pylori neg    History  Substance Use Topics  . Smoking status: Never Smoker   . Smokeless tobacco: Never Used  . Alcohol Use: No     Comment: rare    Family History  Problem Relation Age of Onset  . Coronary artery disease Mother   . Arthritis Mother   . Heart disease Mother   . Coronary artery disease Father 69    CAD/MI  . Hypertension Father   . Heart disease Father   . Diabetes Sister   . Hypertension Sister   . Hypertension Brother   . Cancer Neg Hx     Allergies  Allergen Reactions  . Cymbalta (Duloxetine Hcl) Other (See Comments)    suicidality    Medication list has been reviewed and updated.  Outpatient Prescriptions Prior to Visit  Medication Sig Dispense Refill  . buPROPion  (WELLBUTRIN SR) 200 MG 12 hr tablet Take 1 tablet (200 mg total) by mouth 2 (two) times daily.  180 tablet  3  . calcium carbonate (OS-CAL - DOSED IN MG OF ELEMENTAL CALCIUM) 1250 MG tablet Take 1 tablet by mouth daily.      . citalopram (CELEXA) 20 MG tablet Take 1 tablet (20 mg total) by mouth daily.  90 tablet  3  . diazepam (VALIUM) 5 MG tablet Take 1 tablet (5 mg total) by mouth 2 (two) times daily.  20 tablet  0  . ibuprofen (ADVIL,MOTRIN) 200 MG tablet Take 600 mg by mouth as needed.       Marland Kitchen levothyroxine (SYNTHROID, LEVOTHROID) 100 MCG tablet Take 1 tablet (100 mcg total) by mouth daily.  90 tablet  3  . losartan (COZAAR) 50 MG tablet Take 1 tablet (50 mg total) by mouth daily.  90 tablet  3  . Multiple Vitamins-Minerals (ALIVE WOMENS ENERGY PO) Take 1 tablet by mouth daily.        Marland Kitchen omeprazole (PRILOSEC) 40 MG capsule Take 40 mg by mouth daily.      Marland Kitchen oxyCODONE-acetaminophen (PERCOCET/ROXICET) 5-325 MG per tablet Take 1-2 tablets by mouth every 6 (six) hours as needed for pain.  20 tablet  0  . carisoprodol (SOMA) 350 MG tablet Take 1 tablet (350 mg total) by mouth at bedtime as needed.  90 tablet  3  . cholecalciferol (VITAMIN D) 400 UNITS TABS Take 400 Units by mouth 2 (two) times daily.      Marland Kitchen omega-3 acid ethyl esters (LOVAZA) 1 G capsule Take 1 g by mouth at bedtime.       Last reviewed on 07/20/2012 10:45 AM by Consuello Masse, CMA  Review of Systems:   GEN: No fevers, chills. Nontoxic. Primarily MSK c/o today. MSK: Detailed in the HPI GI: tolerating PO intake without difficulty Neuro: No numbness, parasthesias, or tingling associated. Otherwise the pertinent positives of the ROS are noted above.    Physical Examination: Filed Vitals:   07/20/12 1044  BP: 130/80  Pulse: 88  Temp: 98 F (36.7 C)  TempSrc: Oral  Height: 5\' 4"  (1.626 m)  Weight: 187 lb 12 oz (85.163 kg)  SpO2: 96%    Body mass index is 32.23 kg/(m^2). Ideal Body Weight: Weight in (lb) to have BMI  = 25: 145.3    GEN: WDWN, NAD, Non-toxic, Alert & Oriented x 3 HEENT: Atraumatic, Normocephalic.  Ears and Nose: No external deformity. EXTR: No clubbing/cyanosis/edema NEURO: Normal gait.  PSYCH: Normally interactive. Conversant. Not depressed or anxious appearing.  Calm demeanor.  MSK: TTP around L1-L3 over spinous process. Erector spinae minimally tender. Neurovasc intact. DTR 2+.   Assessment and Plan:  1. Compression fracture of L1 lumbar vertebra    Discussed different treatment options, conservative vs potential kyphoplasty, and she wants to give a conservative trial now. If doing poorly, she will call our office.  Orders Today:  No orders of the defined types were placed in this encounter.    Updated Medication List: (Includes new medications, updates to list, dose adjustments) Meds ordered this encounter  Medications  . oxyCODONE-acetaminophen (PERCOCET) 10-325 MG per tablet    Sig: Take 1 tablet by mouth every 6 (six) hours as needed for pain.    Dispense:  50 tablet    Refill:  0  . diazepam (VALIUM) 5 MG tablet    Sig: Take 1 tablet (5 mg total) by mouth every 12 (twelve) hours as needed.    Dispense:  60 tablet    Refill:  1    Medications Discontinued: Medications Discontinued During This Encounter  Medication Reason  . oxyCODONE-acetaminophen (PERCOCET/ROXICET) 5-325 MG per tablet   . diazepam (VALIUM) 5 MG tablet      Hannah Beat, MD

## 2012-08-11 ENCOUNTER — Ambulatory Visit (INDEPENDENT_AMBULATORY_CARE_PROVIDER_SITE_OTHER): Payer: Medicare Other | Admitting: Family Medicine

## 2012-08-11 ENCOUNTER — Encounter: Payer: Self-pay | Admitting: Family Medicine

## 2012-08-11 VITALS — BP 114/74 | HR 76 | Temp 98.3°F | Wt 190.8 lb

## 2012-08-11 DIAGNOSIS — M818 Other osteoporosis without current pathological fracture: Secondary | ICD-10-CM

## 2012-08-11 DIAGNOSIS — I10 Essential (primary) hypertension: Secondary | ICD-10-CM | POA: Diagnosis not present

## 2012-08-11 DIAGNOSIS — E039 Hypothyroidism, unspecified: Secondary | ICD-10-CM

## 2012-08-11 DIAGNOSIS — S32009A Unspecified fracture of unspecified lumbar vertebra, initial encounter for closed fracture: Secondary | ICD-10-CM

## 2012-08-11 DIAGNOSIS — M949 Disorder of cartilage, unspecified: Secondary | ICD-10-CM

## 2012-08-11 DIAGNOSIS — Z8781 Personal history of (healed) traumatic fracture: Secondary | ICD-10-CM | POA: Insufficient documentation

## 2012-08-11 DIAGNOSIS — E785 Hyperlipidemia, unspecified: Secondary | ICD-10-CM

## 2012-08-11 DIAGNOSIS — M899 Disorder of bone, unspecified: Secondary | ICD-10-CM | POA: Diagnosis not present

## 2012-08-11 DIAGNOSIS — S32010A Wedge compression fracture of first lumbar vertebra, initial encounter for closed fracture: Secondary | ICD-10-CM

## 2012-08-11 DIAGNOSIS — M839 Adult osteomalacia, unspecified: Secondary | ICD-10-CM

## 2012-08-11 MED ORDER — CITALOPRAM HYDROBROMIDE 20 MG PO TABS: 20.0000 mg | ORAL_TABLET | Freq: Every day | ORAL | Status: DC

## 2012-08-11 MED ORDER — OXYCODONE-ACETAMINOPHEN 10-325 MG PO TABS: 1.0000 | ORAL_TABLET | Freq: Four times a day (QID) | ORAL | Status: DC | PRN

## 2012-08-11 MED ORDER — LEVOTHYROXINE SODIUM 100 MCG PO TABS: 100.0000 ug | ORAL_TABLET | Freq: Every day | ORAL | Status: DC

## 2012-08-11 MED ORDER — OMEPRAZOLE 40 MG PO CPDR: 40.0000 mg | DELAYED_RELEASE_CAPSULE | Freq: Every day | ORAL | Status: DC

## 2012-08-11 MED ORDER — LOSARTAN POTASSIUM 50 MG PO TABS: 50.0000 mg | ORAL_TABLET | Freq: Every day | ORAL | Status: DC

## 2012-08-11 MED ORDER — BUPROPION HCL ER (SR) 200 MG PO TB12: 200.0000 mg | ORAL_TABLET | Freq: Two times a day (BID) | ORAL | Status: DC

## 2012-08-11 NOTE — Addendum Note (Signed)
Addended by: Alvina Chou on: 08/11/2012 05:11 PM   Modules accepted: Orders

## 2012-08-11 NOTE — Patient Instructions (Addendum)
Pass by [facility] office for referral to spine doctor to discuss other options for treatment of compression fracture. Return at your convenience fasting for bloodwork. Return afterwards for physical - medicare wellness visit. meds refilled today.

## 2012-08-11 NOTE — Assessment & Plan Note (Addendum)
Fracture sustained early 07/2012. Very slow recovery. Again discussed options, pt would like referral to spine to discuss all options.  Will refer. Was on PPI longterm - will need eval for osteoporosis - placed referral for bone density scan.  If osteoporotic, start bisphosphonate. Refilled percocets.  Has valium refill at home.

## 2012-08-11 NOTE — Progress Notes (Signed)
Subjective:    Patient ID: Brittany [name], female    DOB: 06-24-1956, 56 y.o.   MRN: [id number]  HPI CC: med refill  Seen early Nov by Dr. Salena Saner with vertebral L1 compression fracture sustained during showering - when stood up quickly.  Seen initially at ER - lumbar spine film reviewed.  Treated with percocets and valium.  Dr C discussed treatment options - pt opted for conservative management.  Pain is improving but very slowly.  Trouble sitting, bending, even driving and putting shoes/socks on.  Has been traveling a lot.  Prior seen by Dr. Venetia Maxon at [facility] for cervical neck arthritis s/p ACDF.  rec PT, has not returned for f/u.  HTN - compliant with meds, stable.  Continue.  No HA, vision changes, CP/tightness, SOB, leg swelling.   Depression - on celexa 20mg  daily.  Stable.  Hypothyroid - reports compliance with synthroid. Endorses dry skin despite daily moisturizing.  check TSH today. Wt Readings from Last 3 Encounters:  08/11/12 190 lb 12 oz (86.524 kg)  07/20/12 187 lb 12 oz (85.163 kg)  04/01/12 186 lb (84.369 kg)    Xray: IMPRESSION:  Superior endplate compression fracture at L1 with loss of height of approximately 20% and no apparent retropulsion. New since the CT scan of June.   Past Medical History  Diagnosis Date  . Arthritis     neck pain and numbness left digits Venetia Maxon)  . Depression     failed paxil, lexapro, cymbalta, zoloft, pristiq  . GERD (gastroesophageal reflux disease)   . Seasonal allergies   . HTN (hypertension)   . Migraines     occasional, stress related  . OSA (obstructive sleep apnea)     mild, no CPAP  . Hypothyroid   . Colon polyps     last colonoscopy 2006? rpt due  . History of diverticulitis of colon     s/p resection of large intestine, rpt itis 02/2012  . Postmenopausal     HRT compound, previously premarin  . Fatty liver 02/2012    on CT scan 02/2012  . Diverticulitis   . Vertebral compression fracture 07/2012    L1 20%    Past  Surgical History  Procedure Date  . Tonsillectomy 1964  . Anterior fusion cervical spine 01/1999    cervical HNP  . Knee surgery 1995, 2011    torn menisci (MRI 2010 L)  . Total abdominal hysterectomy w/ bilateral salpingoophorectomy 06/2009    vag bleeding  . Colon resection 2008    2 ft removed, diverticulitis  . US echocardiography 08/2010    normal, EF 63%, mild valvular issues  . Spirometry 2011    normal  . Abdominal hysterectomy   . Colonoscopy 03/2012    partial colectomy, scattered diverticulae Jarold Motto), rpt 10 yrs  . Esophagogastroduodenoscopy 03/2012    mod gastritis, benign polyps, H pylori neg    Review of Systems Per HPI    Objective:   Physical Exam  Nursing note and vitals reviewed. Constitutional: She appears well-developed and well-nourished. No distress.  Cardiovascular: Normal rate, regular rhythm, normal heart sounds and intact distal pulses.   No murmur heard. Pulmonary/Chest: Effort normal and breath sounds normal. No respiratory distress. She has no wheezes. She has no rales.  Musculoskeletal: She exhibits no edema.       Tender midline lower lumbar region  Skin: Skin is warm and dry. No rash noted.  Psychiatric: She has a normal mood and affect.  Assessment & Plan:

## 2012-08-11 NOTE — Assessment & Plan Note (Signed)
Chronic, stable. Continue current regimen. Check Cr today.  

## 2012-08-11 NOTE — Assessment & Plan Note (Signed)
Compliant with synthroid.  Check TSH today.

## 2012-08-13 ENCOUNTER — Other Ambulatory Visit: Payer: Self-pay | Admitting: Family Medicine

## 2012-08-13 ENCOUNTER — Ambulatory Visit (INDEPENDENT_AMBULATORY_CARE_PROVIDER_SITE_OTHER)
Admission: RE | Admit: 2012-08-13 | Discharge: 2012-08-13 | Disposition: A | Discharge status: Home / Self Care | Payer: Medicare Other | Source: Ambulatory Visit

## 2012-08-13 DIAGNOSIS — M899 Disorder of bone, unspecified: Secondary | ICD-10-CM

## 2012-08-13 DIAGNOSIS — T148XXA Other injury of unspecified body region, initial encounter: Secondary | ICD-10-CM

## 2012-08-13 DIAGNOSIS — S32009A Unspecified fracture of unspecified lumbar vertebra, initial encounter for closed fracture: Secondary | ICD-10-CM | POA: Diagnosis not present

## 2012-08-13 DIAGNOSIS — S32010A Wedge compression fracture of first lumbar vertebra, initial encounter for closed fracture: Secondary | ICD-10-CM

## 2012-08-13 DIAGNOSIS — IMO0002 Reserved for concepts with insufficient information to code with codable children: Secondary | ICD-10-CM

## 2012-08-14 ENCOUNTER — Other Ambulatory Visit (INDEPENDENT_AMBULATORY_CARE_PROVIDER_SITE_OTHER): Payer: Medicare Other

## 2012-08-14 DIAGNOSIS — M838 Other adult osteomalacia: Secondary | ICD-10-CM

## 2012-08-14 DIAGNOSIS — M899 Disorder of bone, unspecified: Secondary | ICD-10-CM

## 2012-08-14 DIAGNOSIS — S32009A Unspecified fracture of unspecified lumbar vertebra, initial encounter for closed fracture: Secondary | ICD-10-CM | POA: Diagnosis not present

## 2012-08-14 DIAGNOSIS — I10 Essential (primary) hypertension: Secondary | ICD-10-CM | POA: Diagnosis not present

## 2012-08-14 DIAGNOSIS — E785 Hyperlipidemia, unspecified: Secondary | ICD-10-CM

## 2012-08-14 DIAGNOSIS — M839 Adult osteomalacia, unspecified: Secondary | ICD-10-CM | POA: Diagnosis not present

## 2012-08-14 DIAGNOSIS — E039 Hypothyroidism, unspecified: Secondary | ICD-10-CM

## 2012-08-14 DIAGNOSIS — S32010A Wedge compression fracture of first lumbar vertebra, initial encounter for closed fracture: Secondary | ICD-10-CM

## 2012-08-14 DIAGNOSIS — M818 Other osteoporosis without current pathological fracture: Secondary | ICD-10-CM

## 2012-08-14 DIAGNOSIS — M949 Disorder of cartilage, unspecified: Secondary | ICD-10-CM | POA: Diagnosis not present

## 2012-08-14 LAB — LIPID PANEL
Cholesterol: 222 mg/dL — ABNORMAL HIGH (ref 0–200)
HDL: 54.6 mg/dL (ref >39.00)
Total CHOL/HDL Ratio: 4
VLDL: 17.4 mg/dL (ref 0.0–40.0)

## 2012-08-14 LAB — BASIC METABOLIC PANEL
BUN: 15 mg/dL (ref 6–23)
CO2: 29 mEq/L (ref 19–32)
Chloride: 102 mEq/L (ref 96–112)
Creatinine, Ser: 1.3 mg/dL — ABNORMAL HIGH (ref 0.4–1.2)
Potassium: 4.2 mEq/L (ref 3.5–5.1)

## 2012-08-14 LAB — TSH: TSH: 7.67 u[IU]/mL — ABNORMAL HIGH (ref 0.35–5.50)

## 2012-08-14 LAB — LDL CHOLESTEROL, DIRECT: Direct LDL: 154 mg/dL

## 2012-08-14 LAB — PHOSPHORUS: Phosphorus: 3.5 mg/dL (ref 2.3–4.6)

## 2012-08-17 ENCOUNTER — Other Ambulatory Visit: Payer: Self-pay | Admitting: Family Medicine

## 2012-08-17 DIAGNOSIS — E039 Hypothyroidism, unspecified: Secondary | ICD-10-CM

## 2012-08-17 MED ORDER — LEVOTHYROXINE SODIUM 112 MCG PO TABS: 112.0000 ug | ORAL_TABLET | Freq: Every day | ORAL | Status: DC

## 2012-08-18 ENCOUNTER — Other Ambulatory Visit: Payer: Self-pay

## 2012-08-18 DIAGNOSIS — E039 Hypothyroidism, unspecified: Secondary | ICD-10-CM

## 2012-08-18 MED ORDER — LEVOTHYROXINE SODIUM 112 MCG PO TABS: 112.0000 ug | ORAL_TABLET | Freq: Every day | ORAL | Status: DC

## 2012-08-18 NOTE — Telephone Encounter (Signed)
Pt left v/m;pt went to [facility] and levothyroxine was not there. Med was sent to [facility]. Med sent to [facility] as instructed.Patient notified as instructed by telephone.spoke with Jody at [facility] [phone] she will place hold on acct until Levothyroxine comes in and will d/c rx; then will release acct.

## 2012-08-24 DIAGNOSIS — M47812 Spondylosis without myelopathy or radiculopathy, cervical region: Secondary | ICD-10-CM | POA: Diagnosis not present

## 2012-08-24 DIAGNOSIS — M545 Low back pain: Secondary | ICD-10-CM | POA: Diagnosis not present

## 2012-09-11 ENCOUNTER — Telehealth: Payer: Self-pay | Admitting: *Deleted

## 2012-09-11 NOTE — Telephone Encounter (Signed)
filled out and placed in my out box.  Pt should be on levothyroxine .

## 2012-09-11 NOTE — Telephone Encounter (Signed)
Clarification form for levothyroxine requiring signature in your IN box.

## 2012-09-25 ENCOUNTER — Other Ambulatory Visit: Payer: Self-pay | Admitting: Family Medicine

## 2012-09-25 DIAGNOSIS — E039 Hypothyroidism, unspecified: Secondary | ICD-10-CM

## 2012-09-25 MED ORDER — LEVOTHYROXINE SODIUM 112 MCG PO TABS: 112.0000 ug | ORAL_TABLET | Freq: Every day | ORAL | Status: DC

## 2012-09-28 ENCOUNTER — Other Ambulatory Visit (INDEPENDENT_AMBULATORY_CARE_PROVIDER_SITE_OTHER): Payer: Medicare Other

## 2012-09-28 DIAGNOSIS — E039 Hypothyroidism, unspecified: Secondary | ICD-10-CM | POA: Diagnosis not present

## 2012-09-30 DIAGNOSIS — M545 Low back pain: Secondary | ICD-10-CM | POA: Diagnosis not present

## 2012-11-25 ENCOUNTER — Encounter: Payer: Self-pay | Admitting: Family Medicine

## 2013-04-23 ENCOUNTER — Other Ambulatory Visit: Payer: Self-pay

## 2013-04-23 DIAGNOSIS — Z1231 Encounter for screening mammogram for malignant neoplasm of breast: Secondary | ICD-10-CM

## 2013-04-26 ENCOUNTER — Ambulatory Visit (INDEPENDENT_AMBULATORY_CARE_PROVIDER_SITE_OTHER): Payer: Medicare Other | Admitting: Family Medicine

## 2013-04-26 ENCOUNTER — Encounter: Payer: Self-pay | Admitting: Family Medicine

## 2013-04-26 ENCOUNTER — Ambulatory Visit (INDEPENDENT_AMBULATORY_CARE_PROVIDER_SITE_OTHER)
Admission: RE | Admit: 2013-04-26 | Discharge: 2013-04-26 | Disposition: A | Discharge status: Home / Self Care | Payer: Medicare Other | Source: Ambulatory Visit | Attending: Family Medicine | Admitting: Family Medicine

## 2013-04-26 VITALS — BP 124/78 | HR 88 | Temp 98.8°F | Wt 183.5 lb

## 2013-04-26 DIAGNOSIS — E039 Hypothyroidism, unspecified: Secondary | ICD-10-CM

## 2013-04-26 DIAGNOSIS — H9190 Unspecified hearing loss, unspecified ear: Secondary | ICD-10-CM

## 2013-04-26 DIAGNOSIS — M461 Sacroiliitis, not elsewhere classified: Secondary | ICD-10-CM

## 2013-04-26 DIAGNOSIS — I10 Essential (primary) hypertension: Secondary | ICD-10-CM | POA: Diagnosis not present

## 2013-04-26 DIAGNOSIS — H9193 Unspecified hearing loss, bilateral: Secondary | ICD-10-CM

## 2013-04-26 DIAGNOSIS — M533 Sacrococcygeal disorders, not elsewhere classified: Secondary | ICD-10-CM | POA: Diagnosis not present

## 2013-04-26 MED ORDER — CYCLOBENZAPRINE HCL 10 MG PO TABS: 10.0000 mg | ORAL_TABLET | Freq: Two times a day (BID) | ORAL | Status: DC | PRN

## 2013-04-26 NOTE — Progress Notes (Signed)
  Subjective:    Patient ID: Brittany [name], female    DOB: 11-19-1955, 57 y.o.   MRN: [id number]  HPI CC: back pain  Suffered compression fracture of L1 07/2012.  DEXA showed mild osteopenia.  Had seen Dr. Venetia Maxon.  Plan was watchful waiting.  Endorses persistent pain since this.  Pain starts lower back midline and travels to left.  No shooting pain down legs. No bowel/bladder accidents.  No fevers/chills, no numbness or weakness of legs.   Tried motrin for pain - takes 600mg  every night and then during the day prn.  Has been walking more regularly, driving more.  Thinks this may have flared pain.  Regardless always has pain.  Last night almost went to ER 2/2 severe pain in spine.  H/o hypothyroidism. Compliant with meds. Requests blood work today. Lab Results  Component Value Date   TSH 1.40 09/28/2012    Concern for loss of metatarsal arch on right.   Past Medical History  Diagnosis Date  . Arthritis     neck pain and numbness left digits Venetia Maxon)  . Depression     failed paxil, lexapro, cymbalta, zoloft, pristiq  . GERD (gastroesophageal reflux disease)   . Seasonal allergies   . HTN (hypertension)   . Migraines     occasional, stress related  . OSA (obstructive sleep apnea)     mild, no CPAP  . Hypothyroid   . Colon polyps     last colonoscopy 2006? rpt due  . History of diverticulitis of colon     s/p resection of large intestine, rpt itis 02/2012  . Postmenopausal     HRT compound, previously premarin  . Fatty liver 02/2012    on CT scan 02/2012  . Diverticulitis   . Vertebral compression fracture 07/2012    L1 25% Venetia Maxon) released without intervention    Past Surgical History  Procedure Laterality Date  . Tonsillectomy  1964  . Anterior fusion cervical spine  01/1999    cervical HNP  . Knee surgery  1995, 2011    torn menisci (MRI 2010 L)  . Total abdominal hysterectomy w/ bilateral salpingoophorectomy  06/2009    vag bleeding  . Colon resection  2008    2 ft  removed, diverticulitis  . US echocardiography  08/2010    normal, EF 63%, mild valvular issues  . Spirometry  2011    normal  . Abdominal hysterectomy    . Colonoscopy  03/2012    partial colectomy, scattered diverticulae Jarold Motto), rpt 10 yrs  . Esophagogastroduodenoscopy  03/2012    mod gastritis, benign polyps, H pylori neg   Review of Systems Per HPI    Objective:   Physical Exam  Nursing note and vitals reviewed. Constitutional: She is oriented to person, place, and time. She appears well-developed and well-nourished. No distress.  Musculoskeletal: She exhibits no edema.  no midline spine tenderness.  No paraspinous mm tenderness. Neg SLR bilaterally No pain with int/ext rotation of hip + FABER on left Tender to palpation L SIJ. No pain at GTB or sciatic notch  Mild loss of transverse arch on left.  Wears flip flops today.  Neurological: She is alert and oriented to person, place, and time. She has normal strength. No sensory deficit.  Reflex Scores:      Patellar reflexes are 2+ on the right side and 2+ on the left side.      Assessment & Plan:

## 2013-04-26 NOTE — Addendum Note (Signed)
Addended by: Eustaquio Boyden on: 04/26/2013 05:22 PM   Modules accepted: Orders

## 2013-04-26 NOTE — Assessment & Plan Note (Signed)
Check tsh today 

## 2013-04-26 NOTE — Assessment & Plan Note (Signed)
Check screen today.  Pt endorses trouble at home. R cerumen impaction cleared today.

## 2013-04-26 NOTE — Patient Instructions (Signed)
Blood work today. Xray today. Pass by [facility]'s office to schedule orthopedist appointment Do stretching exercises provided today. Good to see you today, call us with quesitons.

## 2013-04-26 NOTE — Assessment & Plan Note (Addendum)
L lower back pain - despite h/o L1 compression fracture, today seems consistent with L SIJ. Treat with stretching exercises today from [facility] pt advisor. Check xray given chronicity of sxs. Will also refer to ortho for further eval (?consideration of SIJ injection). Will prescribe flexeril muscle relaxant for sleep and prn muscle spasm.

## 2013-04-27 LAB — CBC WITH DIFFERENTIAL/PLATELET
Basophils Absolute: 0 10*3/uL (ref 0.0–0.1)
Basophils Relative: 0.4 % (ref 0.0–3.0)
Eosinophils Absolute: 0.2 10*3/uL (ref 0.0–0.7)
Hemoglobin: 13.7 g/dL (ref 12.0–15.0)
MCHC: 34.1 g/dL (ref 30.0–36.0)
MCV: 89 fl (ref 78.0–100.0)
Monocytes Absolute: 0.3 10*3/uL (ref 0.1–1.0)
Neutro Abs: 4.4 10*3/uL (ref 1.4–7.7)
RBC: 4.51 Mil/uL (ref 3.87–5.11)
RDW: 13.8 % (ref 11.5–14.6)

## 2013-04-27 LAB — BASIC METABOLIC PANEL
BUN: 16 mg/dL (ref 6–23)
Calcium: 8.9 mg/dL (ref 8.4–10.5)
Creatinine, Ser: 1.2 mg/dL (ref 0.4–1.2)
GFR: 50.97 mL/min — ABNORMAL LOW (ref >60.00)

## 2013-04-28 ENCOUNTER — Other Ambulatory Visit: Payer: Self-pay | Admitting: Family Medicine

## 2013-04-28 MED ORDER — TRAMADOL HCL 50 MG PO TABS: 50.0000 mg | ORAL_TABLET | Freq: Two times a day (BID) | ORAL | Status: DC | PRN

## 2013-05-03 DIAGNOSIS — M5137 Other intervertebral disc degeneration, lumbosacral region: Secondary | ICD-10-CM | POA: Diagnosis not present

## 2013-05-03 DIAGNOSIS — M7710 Lateral epicondylitis, unspecified elbow: Secondary | ICD-10-CM | POA: Diagnosis not present

## 2013-05-03 DIAGNOSIS — G5600 Carpal tunnel syndrome, unspecified upper limb: Secondary | ICD-10-CM | POA: Diagnosis not present

## 2013-05-06 ENCOUNTER — Ambulatory Visit
Admission: RE | Admit: 2013-05-06 | Discharge: 2013-05-06 | Disposition: A | Discharge status: Home / Self Care | Payer: Medicare Other | Source: Ambulatory Visit

## 2013-05-06 DIAGNOSIS — Z1231 Encounter for screening mammogram for malignant neoplasm of breast: Secondary | ICD-10-CM | POA: Diagnosis not present

## 2013-05-07 ENCOUNTER — Encounter: Payer: Self-pay | Admitting: *Deleted

## 2013-05-07 ENCOUNTER — Encounter: Payer: Self-pay | Admitting: Family Medicine

## 2013-05-08 DIAGNOSIS — M5137 Other intervertebral disc degeneration, lumbosacral region: Secondary | ICD-10-CM | POA: Diagnosis not present

## 2013-05-25 DIAGNOSIS — M545 Low back pain: Secondary | ICD-10-CM | POA: Diagnosis not present

## 2013-05-25 DIAGNOSIS — M5137 Other intervertebral disc degeneration, lumbosacral region: Secondary | ICD-10-CM | POA: Diagnosis not present

## 2013-05-27 ENCOUNTER — Other Ambulatory Visit: Payer: Self-pay

## 2013-05-27 MED ORDER — CYCLOBENZAPRINE HCL 10 MG PO TABS: 10.0000 mg | ORAL_TABLET | Freq: Two times a day (BID) | ORAL | Status: DC | PRN

## 2013-05-27 NOTE — Telephone Encounter (Signed)
plz notify this was sent in. 

## 2013-05-27 NOTE — Telephone Encounter (Signed)
pt has seen spine specialist; pt was told has multiple problems in spine and surgery is not advised at this time; going to try some injections for now; Pain level is still high especially at night when pt is trying to sleep.Pt asked spine dr for refill of muscle relaxant but pt was told to ck with Dr Reece Agar. pt request refill for cyclobenzaprine to [facility].Please advise.

## 2013-05-27 NOTE — Telephone Encounter (Signed)
Message left notifying patient.

## 2013-06-08 DIAGNOSIS — M5137 Other intervertebral disc degeneration, lumbosacral region: Secondary | ICD-10-CM | POA: Diagnosis not present

## 2013-06-08 DIAGNOSIS — M47817 Spondylosis without myelopathy or radiculopathy, lumbosacral region: Secondary | ICD-10-CM | POA: Diagnosis not present

## 2013-06-09 ENCOUNTER — Encounter: Payer: Self-pay | Admitting: Family Medicine

## 2013-06-22 DIAGNOSIS — M5137 Other intervertebral disc degeneration, lumbosacral region: Secondary | ICD-10-CM | POA: Diagnosis not present

## 2013-07-06 ENCOUNTER — Encounter: Payer: Self-pay | Admitting: Family Medicine

## 2013-07-15 ENCOUNTER — Other Ambulatory Visit: Payer: Self-pay

## 2013-07-21 ENCOUNTER — Encounter: Payer: Self-pay | Admitting: Family Medicine

## 2013-07-21 ENCOUNTER — Ambulatory Visit (INDEPENDENT_AMBULATORY_CARE_PROVIDER_SITE_OTHER): Payer: Medicare Other | Admitting: Family Medicine

## 2013-07-21 VITALS — BP 128/66 | HR 89 | Temp 98.4°F | Ht 64.0 in | Wt 191.5 lb

## 2013-07-21 DIAGNOSIS — E669 Obesity, unspecified: Secondary | ICD-10-CM | POA: Insufficient documentation

## 2013-07-21 DIAGNOSIS — J019 Acute sinusitis, unspecified: Secondary | ICD-10-CM

## 2013-07-21 DIAGNOSIS — E66811 Obesity, class 1: Secondary | ICD-10-CM | POA: Insufficient documentation

## 2013-07-21 MED ORDER — AMOXICILLIN 500 MG PO CAPS: 1000.0000 mg | ORAL_CAPSULE | Freq: Two times a day (BID) | ORAL | Status: DC

## 2013-07-21 NOTE — Progress Notes (Signed)
Pre-visit discussion using our clinic review tool. No additional management support is needed unless otherwise documented below in the visit note.  

## 2013-07-21 NOTE — Progress Notes (Signed)
Date:  07/21/2013   Name:  Brittany [name]   DOB:  November 01, 1955   MRN:  [id number] Gender: female Age: 57 y.o.  Primary Physician:  Eustaquio Boyden, MD   Chief Complaint: Cough, Nasal Congestion and Shortness of Breath   Subjective:   History of Present Illness:  Brittany [name] is a 57 y.o. very pleasant female patient who presents with the following:  Headache and nasal congestion, downthroat, some trouble breathing with walking around. Lot of stuff coming out of her nose. Shoulders are also tight. Sick now for about 2-3 weeks. Generally feels terrible  Also is thirsty all the time.  Recently around a smoker - in an enclosed space.   Past Medical History, Surgical History, Social History, Family History, Problem List, Medications, and Allergies have been reviewed and updated if relevant.  Review of Systems: ROS: GEN: Acute illness details above GI: Tolerating PO intake GU: maintaining adequate hydration and urination Pulm: No SOB Interactive and getting along well at home.  Otherwise, ROS is as per the HPI.   Objective:   Physical Examination: BP 128/66  Pulse 89  Temp(Src) 98.4 F (36.9 C) (Oral)  Ht 5\' 4"  (1.626 m)  Wt 191 lb 8 oz (86.864 kg)  BMI 32.85 kg/m2  SpO2 96%   Gen: WDWN, NAD; alert,appropriate and cooperative throughout exam  HEENT: Normocephalic and atraumatic. Throat clear, w/o exudate, no LAD, R TM + fluid, L TM - good landmarks, + fluid present. rhinnorhea.  Left frontal and maxillary sinuses: Tender Right frontal and maxillary sinuses: Tender  Neck: No ant or post LAD CV: RRR, No M/G/R Pulm: Breathing comfortably in no resp distress. no w/c/r Abd: S,NT,ND,+BS Extr: no c/c/e Psych: full affect, pleasant   Recent Results (from the past 2160 hour(s))  CBC WITH DIFFERENTIAL     Status: None   Collection Time    04/26/13  4:43 PM      Result Value Range   WBC 6.6  4.5 - 10.5 K/uL   RBC 4.51  3.87 - 5.11 Mil/uL   Hemoglobin 13.7  12.0 -  15.0 g/dL   HCT 40.9  81.1 - 91.4 %   MCV 89.0  78.0 - 100.0 fl   MCHC 34.1  30.0 - 36.0 g/dL   RDW 78.2  95.6 - 21.3 %   Platelets 260.0  150.0 - 400.0 K/uL   Neutrophils Relative % 65.7  43.0 - 77.0 %   Lymphocytes Relative 25.9  12.0 - 46.0 %   Monocytes Relative 4.6  3.0 - 12.0 %   Eosinophils Relative 3.4  0.0 - 5.0 %   Basophils Relative 0.4  0.0 - 3.0 %   Neutro Abs 4.4  1.4 - 7.7 K/uL   Lymphs Abs 1.7  0.7 - 4.0 K/uL   Monocytes Absolute 0.3  0.1 - 1.0 K/uL   Eosinophils Absolute 0.2  0.0 - 0.7 K/uL   Basophils Absolute 0.0  0.0 - 0.1 K/uL  BASIC METABOLIC PANEL     Status: Abnormal   Collection Time    04/26/13  4:43 PM      Result Value Range   Sodium 139  135 - 145 mEq/L   Potassium 4.9  3.5 - 5.1 mEq/L   Chloride 105  96 - 112 mEq/L   CO2 28  19 - 32 mEq/L   Glucose, Bld 93  70 - 99 mg/dL   BUN 16  6 - 23 mg/dL   Creatinine, Ser 1.2  0.4 - 1.2  mg/dL   Calcium 8.9  8.4 - 47.8 mg/dL   GFR 29.56 (*) >21.30 mL/min  HM MAMMOGRAPHY     Status: None   Collection Time    05/06/13 12:00 AM      Result Value Range   HM Mammogram Birads 1-Negative-Repeat 1 year    HM MAMMOGRAPHY     Status: None   Collection Time    05/07/13 12:00 AM      Result Value Range   HM Mammogram Birads 1-Negative-Repeat 1 year      Assessment & Plan:    Acute sinusitis  Acute sinusitis: ABX as below.   Reviewed symptomatic care as well as ABX in this case.   There are no Patient Instructions on file for this visit.  Orders Today:  No orders of the defined types were placed in this encounter.    New medications, updates to list, dose adjustments: Meds ordered this encounter  Medications  . buPROPion (WELLBUTRIN SR) 200 MG 12 hr tablet    Sig: Take 200 mg by mouth daily.  Marland Kitchen amoxicillin (AMOXIL) 500 MG capsule    Sig: Take 2 capsules (1,000 mg total) by mouth 2 (two) times daily.    Dispense:  40 capsule    Refill:  0    Signed,  Marshelle Bilger T. Deborha Moseley, MD, CAQ Sports  Medicine  [facility] at [facility] [address] [place] [address] Phone: [phone] Fax: [phone]  Updated Complete Medication List:   Medication List       This list is accurate as of: 07/21/13 11:28 AM.  Always use your most recent med list.               ALIVE WOMENS ENERGY PO  Take 1 tablet by mouth daily.     amoxicillin 500 MG capsule  Commonly known as:  AMOXIL  Take 2 capsules (1,000 mg total) by mouth 2 (two) times daily.     buPROPion 200 MG 12 hr tablet  Commonly known as:  WELLBUTRIN SR  Take 200 mg by mouth daily.     CITRACAL PLUS PO  Take 1 tablet by mouth 2 (two) times daily. Total of 1600mg  calcium and 2000 IU vit D     cyclobenzaprine 10 MG tablet  Commonly known as:  FLEXERIL  Take 1 tablet (10 mg total) by mouth 2 (two) times daily as needed for muscle spasms (sedation precautions).     fish oil-omega-3 fatty acids 1000 MG capsule  Take 1 g by mouth daily.     ibuprofen 200 MG tablet  Commonly known as:  ADVIL,MOTRIN  Take 600 mg by mouth as needed.     levothyroxine 112 MCG tablet  Commonly known as:  SYNTHROID, LEVOTHROID  Take 1 tablet (112 mcg total) by mouth daily.     losartan 50 MG tablet  Commonly known as:  COZAAR  Take 1 tablet (50 mg total) by mouth daily.     omeprazole 40 MG capsule  Commonly known as:  PRILOSEC  Take 1 capsule (40 mg total) by mouth daily.

## 2013-08-03 ENCOUNTER — Ambulatory Visit (INDEPENDENT_AMBULATORY_CARE_PROVIDER_SITE_OTHER): Payer: Medicare Other | Admitting: Family Medicine

## 2013-08-03 ENCOUNTER — Encounter: Payer: Self-pay | Admitting: Family Medicine

## 2013-08-03 VITALS — BP 140/72 | HR 78 | Temp 98.4°F | Ht 64.0 in | Wt 188.5 lb

## 2013-08-03 DIAGNOSIS — J0191 Acute recurrent sinusitis, unspecified: Secondary | ICD-10-CM

## 2013-08-03 DIAGNOSIS — J019 Acute sinusitis, unspecified: Secondary | ICD-10-CM

## 2013-08-03 MED ORDER — LEVOFLOXACIN 500 MG PO TABS: 500.0000 mg | ORAL_TABLET | Freq: Every day | ORAL | Status: DC

## 2013-08-03 MED ORDER — PREDNISONE 20 MG PO TABS: 20.0000 mg | ORAL_TABLET | Freq: Every day | ORAL | Status: DC

## 2013-08-03 NOTE — Progress Notes (Signed)
Date:  08/03/2013   Name:  Brittany [name]   DOB:  1955/10/05   MRN:  [id number] Gender: female Age: 57 y.o.  Primary Physician:  Eustaquio Boyden, MD   Chief Complaint: Follow-up   Subjective:   History of Present Illness:  Brittany [name] is a 57 y.o. very pleasant female patient who presents with the following:  Cold, pressure behind L eye is really bad - horrible pain behind it. Manson Passey stuff coming out of ear canal. Much nasal congestion.  Felt a lot of nausea and felt kind of a lot. Better for only a couple of days. Chills, aches  S/p 10 days of amox  Past Medical History, Surgical History, Social History, Family History, Problem List, Medications, and Allergies have been reviewed and updated if relevant.  Review of Systems: ROS: GEN: Acute illness details above GI: Tolerating PO intake GU: maintaining adequate hydration and urination Pulm: No SOB Interactive and getting along well at home.  Otherwise, ROS is as per the HPI.   Objective:   Physical Examination: BP 140/72  Pulse 78  Temp(Src) 98.4 F (36.9 C) (Oral)  Ht 5\' 4"  (1.626 m)  Wt 188 lb 8 oz (85.503 kg)  BMI 32.34 kg/m2   Gen: WDWN, NAD; alert,appropriate and cooperative throughout exam  HEENT: Normocephalic and atraumatic. Throat clear, w/o exudate, no LAD, R TM clear, L TM - good landmarks, No fluid present. rhinnorhea.  Left frontal and maxillary sinuses: Tender, left, ethmoid Right frontal and maxillary sinuses: nonTender  Neck: No ant or post LAD CV: RRR, No M/G/R Pulm: Breathing comfortably in no resp distress. no w/c/r Abd: S,NT,ND,+BS Extr: no c/c/e Psych: full affect, pleasant    Laboratory and Imaging Data:  Assessment & Plan:    Recurrent acute sinusitis Acute sinusitis: ABX as below.  Failure abx x 1 Reviewed symptomatic care as well as ABX in this case.    SINUSITIS Sinuses are cavities in facial skeleton that drain to nose. Impaired drainage and obstruction of sinus  passages main cause.  Treatment: 1. Take all Antibiotics 2. Open nasal and sinus canals: Oral decongestant: Sudafed. (CAUTION IF HIGH BLOOD PRESSURE) 3. Steam inhalation 4. Humidifier in room 5. Frequent nasal saline irrigation 6. Moist heat compresses to face 7. Tylenol or Ibuprofen for pain and fever, follow directions on bottle.   New medications, updates to list, dose adjustments: Meds ordered this encounter  Medications  . levofloxacin (LEVAQUIN) 500 MG tablet    Sig: Take 1 tablet (500 mg total) by mouth daily.    Dispense:  14 tablet    Refill:  0  . predniSONE (DELTASONE) 20 MG tablet    Sig: Take 1 tablet (20 mg total) by mouth daily with breakfast.    Dispense:  7 tablet    Refill:  0    Signed,  Jarod Bozzo T. Dyshon Philbin, MD, CAQ Sports Medicine  [facility] at [facility] [address] [place] [address] Phone: [phone] Fax: [phone]  Updated Complete Medication List:   Medication List       This list is accurate as of: 08/03/13 12:52 PM.  Always use your most recent med list.               ALIVE WOMENS ENERGY PO  Take 1 tablet by mouth daily.     buPROPion 200 MG 12 hr tablet  Commonly known as:  WELLBUTRIN SR  Take 200 mg by mouth daily.     CITRACAL PLUS PO  Take 1 tablet by mouth 2 (two) times daily. Total of 1600mg  calcium and 2000 IU vit D     fish oil-omega-3 fatty acids 1000 MG capsule  Take 1 g by mouth daily.     ibuprofen 200 MG tablet  Commonly known as:  ADVIL,MOTRIN  Take 600 mg by mouth as needed.     levofloxacin 500 MG tablet  Commonly known as:  LEVAQUIN  Take 1 tablet (500 mg total) by mouth daily.     levothyroxine 112 MCG tablet  Commonly known as:  SYNTHROID, LEVOTHROID  Take 1 tablet (112 mcg total) by mouth daily.     losartan 50 MG tablet  Commonly known as:  COZAAR  Take 1 tablet (50 mg total) by mouth daily.     omeprazole 40 MG capsule  Commonly known as:  PRILOSEC  Take 1 capsule (40 mg  total) by mouth daily.     predniSONE 20 MG tablet  Commonly known as:  DELTASONE  Take 1 tablet (20 mg total) by mouth daily with breakfast.

## 2013-08-03 NOTE — Progress Notes (Signed)
Pre-visit discussion using our clinic review tool. No additional management support is needed unless otherwise documented below in the visit note.  

## 2013-08-12 ENCOUNTER — Encounter: Payer: Self-pay | Admitting: Internal Medicine

## 2013-08-12 ENCOUNTER — Ambulatory Visit (INDEPENDENT_AMBULATORY_CARE_PROVIDER_SITE_OTHER): Payer: Medicare Other | Admitting: Internal Medicine

## 2013-08-12 ENCOUNTER — Ambulatory Visit (INDEPENDENT_AMBULATORY_CARE_PROVIDER_SITE_OTHER)
Admission: RE | Admit: 2013-08-12 | Discharge: 2013-08-12 | Disposition: A | Discharge status: Home / Self Care | Payer: Medicare Other | Source: Ambulatory Visit | Attending: Internal Medicine | Admitting: Internal Medicine

## 2013-08-12 VITALS — BP 118/74 | HR 100 | Temp 99.4°F | Resp 20 | Ht 64.0 in | Wt 185.0 lb

## 2013-08-12 DIAGNOSIS — R509 Fever, unspecified: Secondary | ICD-10-CM

## 2013-08-12 DIAGNOSIS — R05 Cough: Secondary | ICD-10-CM | POA: Diagnosis not present

## 2013-08-12 LAB — POCT INFLUENZA A/B
Influenza A, POC: NEGATIVE
Influenza B, POC: NEGATIVE

## 2013-08-12 MED ORDER — LEVOFLOXACIN 500 MG PO TABS: 500.0000 mg | ORAL_TABLET | Freq: Every day | ORAL | Status: DC

## 2013-08-12 MED ORDER — AMOXICILLIN-POT CLAVULANATE 875-125 MG PO TABS: 1.0000 | ORAL_TABLET | Freq: Two times a day (BID) | ORAL | Status: DC

## 2013-08-12 MED ORDER — FLUTICASONE PROPIONATE 50 MCG/ACT NA SUSP: 2.0000 | Freq: Two times a day (BID) | NASAL | Status: DC

## 2013-08-12 NOTE — Assessment & Plan Note (Signed)
Along with chills and sweats  Flu rapid test negative Seems to be continuation of the sinus infection she has had for weeks CXR normal Prednisone didn't clear---but then massive drainage after she stopped  Will extend the levaquin Add augmentin for anaerobic coverage flonase If not better by 12/8--will set up with ENT [facility])

## 2013-08-12 NOTE — Progress Notes (Signed)
Pre-visit discussion using our clinic review tool. No additional management support is needed unless otherwise documented below in the visit note.  

## 2013-08-12 NOTE — Patient Instructions (Signed)
Please call on Monday for referral to ENT if you are not feeling better.

## 2013-08-12 NOTE — Progress Notes (Signed)
Subjective:    Patient ID: Brittany [name], female    DOB: [birth year], 57 y.o.   MRN: [id number]  HPI Never did clear up after last visit and  after the prednisone ended--she had "everything  Still on the levaquin Now the nose "just released"----lots of discharge Just can't seem to get better  Some fever--101 last night Got night sweat last night Cough with deep down chest pain Has had chills also Not really SOB---but not doing anything  Tried nyquil yesterday and mucinex---no sure if it helped  Current Outpatient Prescriptions on File Prior to Visit  Medication Sig Dispense Refill  . buPROPion (WELLBUTRIN SR) 200 MG 12 hr tablet Take 200 mg by mouth daily.      . fish oil-omega-3 fatty acids 1000 MG capsule Take 1 g by mouth daily.      Marland Kitchen ibuprofen (ADVIL,MOTRIN) 200 MG tablet Take 600 mg by mouth as needed.       Marland Kitchen levofloxacin (LEVAQUIN) 500 MG tablet Take 1 tablet (500 mg total) by mouth daily.  14 tablet  0  . levothyroxine (SYNTHROID, LEVOTHROID) 112 MCG tablet Take 1 tablet (112 mcg total) by mouth daily.  90 tablet  3  . losartan (COZAAR) 50 MG tablet Take 1 tablet (50 mg total) by mouth daily.  90 tablet  3  . Multiple Minerals-Vitamins (CITRACAL PLUS PO) Take 1 tablet by mouth 2 (two) times daily. Total of 1600mg  calcium and 2000 IU vit D      . Multiple Vitamins-Minerals (ALIVE WOMENS ENERGY PO) Take 1 tablet by mouth daily.        Marland Kitchen omeprazole (PRILOSEC) 40 MG capsule Take 1 capsule (40 mg total) by mouth daily.  90 capsule  3   No current facility-administered medications on file prior to visit.    Allergies  Allergen Reactions  . Cymbalta [Duloxetine Hcl] Other (See Comments)    suicidality    Past Medical History  Diagnosis Date  . Arthritis     neck pain and numbness left digits Venetia Maxon)  . Depression     failed paxil, lexapro, cymbalta, zoloft, pristiq  . GERD (gastroesophageal reflux disease)   . Seasonal allergies   . HTN (hypertension)   . Migraines      occasional, stress related  . OSA (obstructive sleep apnea)     mild, no CPAP  . Hypothyroid   . Colon polyps     last colonoscopy 2006? rpt due  . History of diverticulitis of colon     s/p resection of large intestine, rpt itis 02/2012  . Postmenopausal     HRT compound, previously premarin  . Fatty liver 02/2012    on CT scan 02/2012  . Diverticulitis   . Vertebral compression fracture 07/2012    L1 25% Venetia Maxon) released without intervention  . Chronic lower back pain     persistent after L1 cmp fx, with multilevel mild lumbar DDD - good resolution after L L5/S1 ESI Ethelene Hal)    Past Surgical History  Procedure Laterality Date  . Tonsillectomy  1964  . Anterior fusion cervical spine  01/1999    cervical HNP  . Knee surgery  1995, 2011    torn menisci (MRI 2010 L)  . Total abdominal hysterectomy w/ bilateral salpingoophorectomy  06/2009    vag bleeding  . Colon resection  2008    2 ft removed, diverticulitis  . US echocardiography  08/2010    normal, EF 63%, mild valvular issues  . Spirometry  2011    normal  . Abdominal hysterectomy    . Colonoscopy  03/2012    partial colectomy, scattered diverticulae Jarold Motto), rpt 10 yrs  . Esophagogastroduodenoscopy  03/2012    mod gastritis, benign polyps, H pylori neg  . Esi Left 06/2013    left L5/S1 with good resolution of pain    Family History  Problem Relation Age of Onset  . Coronary artery disease Mother   . Arthritis Mother   . Heart disease Mother   . Coronary artery disease Father 77    CAD/MI  . Hypertension Father   . Heart disease Father   . Diabetes Sister   . Hypertension Sister   . Hypertension Brother   . Cancer Neg Hx     History   Social History  . Marital Status: Married    Spouse Name: N/A    Number of Children: 2  . Years of Education: HS   Occupational History  .     Social History Main Topics  . Smoking status: Never Smoker   . Smokeless tobacco: Never Used  . Alcohol Use: Yes      Comment: rare  . Drug Use: No  . Sexual Activity: Not on file   Other Topics Concern  . Not on file   Social History Narrative   Caffeine: 2-3 cups/day (1-2 coffee)   Lives with husband and 14yo son, 4 dogs   Activity: no scheduled   Diet: lots of water, fruits/vegetable   Review of Systems Did vomit yesterday and had loose stools Better now----ended with dry heaving and then stopped Very little food---no appetite     Objective:   Physical Exam  Constitutional: No distress.  Mildly uncomfortable  HENT:  Mouth/Throat: Oropharynx is clear and moist. No oropharyngeal exudate.  No sinus tenderness Moderate nasal inflammation with green mucus TMs normal  Neck: Normal range of motion. Neck supple.  Pulmonary/Chest: Effort normal and breath sounds normal. No respiratory distress. She has no wheezes. She has no rales.  No dullness  Lymphadenopathy:    She has no cervical adenopathy.  Skin:  Clammy and skin cool          Assessment & Plan:

## 2013-09-08 ENCOUNTER — Other Ambulatory Visit: Payer: Self-pay | Admitting: Family Medicine

## 2013-09-28 ENCOUNTER — Encounter: Payer: Self-pay | Admitting: Family Medicine

## 2013-09-28 ENCOUNTER — Ambulatory Visit (INDEPENDENT_AMBULATORY_CARE_PROVIDER_SITE_OTHER): Payer: Medicare Other | Admitting: Family Medicine

## 2013-09-28 VITALS — BP 128/72 | HR 88 | Temp 98.1°F | Wt 186.2 lb

## 2013-09-28 DIAGNOSIS — R300 Dysuria: Secondary | ICD-10-CM | POA: Diagnosis not present

## 2013-09-28 DIAGNOSIS — N390 Urinary tract infection, site not specified: Secondary | ICD-10-CM | POA: Insufficient documentation

## 2013-09-28 LAB — POCT URINALYSIS DIPSTICK
Bilirubin, UA: NEGATIVE
Glucose, UA: NEGATIVE
KETONES UA: NEGATIVE
Nitrite, UA: POSITIVE
Spec Grav, UA: 1.01
Urobilinogen, UA: 0.2
pH, UA: 6

## 2013-09-28 MED ORDER — SULFAMETHOXAZOLE-TMP DS 800-160 MG PO TABS: 1.0000 | ORAL_TABLET | Freq: Two times a day (BID) | ORAL | Status: DC

## 2013-09-28 NOTE — Assessment & Plan Note (Signed)
UA/micro today consistent with UTI - treat with bactrim 5d course. Bactrim chosen because of recent abx use (levaquin and augmentin). UCx sent. Update if sxs persist or worsen.

## 2013-09-28 NOTE — Patient Instructions (Addendum)
I do think you had UTI - treat with antibiotic course - sent to pharmacy. I have also sent culture. Push fluids and rest.  May use tylenol for discomfort. Let us know if not improving with treatment.  Urinary Tract Infection Urinary tract infections (UTIs) can develop anywhere along your urinary tract. Your urinary tract is your body's drainage system for removing wastes and extra water. Your urinary tract includes two kidneys, two ureters, a bladder, and a urethra. Your kidneys are a pair of bean-shaped organs. Each kidney is about the size of your fist. They are located below your ribs, one on each side of your spine. CAUSES Infections are caused by microbes, which are microscopic organisms, including fungi, viruses, and bacteria. These organisms are so small that they can only be seen through a microscope. Bacteria are the microbes that most commonly cause UTIs. SYMPTOMS  Symptoms of UTIs may vary by age and gender of the patient and by the location of the infection. Symptoms in young women typically include a frequent and intense urge to urinate and a painful, burning feeling in the bladder or urethra during urination. Older women and men are more likely to be tired, shaky, and weak and have muscle aches and abdominal pain. A fever may mean the infection is in your kidneys. Other symptoms of a kidney infection include pain in your back or sides below the ribs, nausea, and vomiting. DIAGNOSIS To diagnose a UTI, your caregiver will ask you about your symptoms. Your caregiver also will ask to provide a urine sample. The urine sample will be tested for bacteria and white blood cells. White blood cells are made by your body to help fight infection. TREATMENT  Typically, UTIs can be treated with medication. Because most UTIs are caused by a bacterial infection, they usually can be treated with the use of antibiotics. The choice of antibiotic and length of treatment depend on your symptoms and the type of  bacteria causing your infection. HOME CARE INSTRUCTIONS  If you were prescribed antibiotics, take them exactly as your caregiver instructs you. Finish the medication even if you feel better after you have only taken some of the medication.  Drink enough water and fluids to keep your urine clear or pale yellow.  Avoid caffeine, tea, and carbonated beverages. They tend to irritate your bladder.  Empty your bladder often. Avoid holding urine for long periods of time.  Empty your bladder before and after sexual intercourse.  After a bowel movement, women should cleanse from front to back. Use each tissue only once. SEEK MEDICAL CARE IF:   You have back pain.  You develop a fever.  Your symptoms do not begin to resolve within 3 days. SEEK IMMEDIATE MEDICAL CARE IF:   You have severe back pain or lower abdominal pain.  You develop chills.  You have nausea or vomiting.  You have continued burning or discomfort with urination. MAKE SURE YOU:   Understand these instructions.  Will watch your condition.  Will get help right away if you are not doing well or get worse. Document Released: 06/05/2005 Document Revised: 02/25/2012 Document Reviewed: 10/04/2011 [facility] Patient Information 2014 [place].

## 2013-09-28 NOTE — Progress Notes (Signed)
   Subjective:    Patient ID: Brittany [name], female    DOB: December 25, 1955, 58 y.o.   MRN: [id number]  HPI CC: UTI?  Since last seen by myself, did have prolonged sinusitis infection treated with prolonged levaquin course, augmentin course and prednisone.  Thinks she will go see ENT because of persistent congestion.  Recent trip to [place].  2d ago with body aches and watery diarrhea.  + chills.  Towards evening took immodium.  Diarrhea finally resolved.  Yesterday started noticing dysuria with severe urgency and incomplete emptying.  Today actually feeling better. No sick contacts at home.  Didn't eat any suspicious food. No fevers, no cough.  No blood in urine, nausea/vomiting, abd pain.  No new rashes.  Noticing increasing heartburn. Staying hydrated with plenty of water. Did not receive flu shot this year.   Past Medical History  Diagnosis Date  . Arthritis     neck pain and numbness left digits Vertell Limber)  . Depression     failed paxil, lexapro, cymbalta, zoloft, pristiq  . GERD (gastroesophageal reflux disease)   . Seasonal allergies   . HTN (hypertension)   . Migraines     occasional, stress related  . OSA (obstructive sleep apnea)     mild, no CPAP  . Hypothyroid   . Colon polyps     last colonoscopy 2006? rpt due  . History of diverticulitis of colon     s/p resection of large intestine, rpt itis 02/2012  . Postmenopausal     HRT compound, previously premarin  . Fatty liver 02/2012    on CT scan 02/2012  . Diverticulitis   . Vertebral compression fracture 07/2012    L1 25% Vertell Limber) released without intervention  . Chronic lower back pain     persistent after L1 cmp fx, with multilevel mild lumbar DDD - good resolution after L L5/S1 ESI (Ramos)     Review of Systems Per HPI    Objective:   Physical Exam  Nursing note and vitals reviewed. Constitutional: She appears well-developed and well-nourished. No distress.  HENT:  Nose: Rhinorrhea present. Right sinus  exhibits no maxillary sinus tenderness and no frontal sinus tenderness. Left sinus exhibits no maxillary sinus tenderness and no frontal sinus tenderness.  Mouth/Throat: Uvula is midline, oropharynx is clear and moist and mucous membranes are normal. No oropharyngeal exudate, posterior oropharyngeal edema, posterior oropharyngeal erythema or tonsillar abscesses.  Eyes: Conjunctivae and EOM are normal. Pupils are equal, round, and reactive to light. No scleral icterus.  Cardiovascular: Normal rate, regular rhythm, normal heart sounds and intact distal pulses.   No murmur heard. Pulmonary/Chest: Effort normal and breath sounds normal. No respiratory distress. She has no wheezes. She has no rales.  Abdominal: Soft. Bowel sounds are normal. She exhibits no distension and no mass. There is no hepatosplenomegaly. There is no tenderness. There is no rigidity, no rebound, no guarding, no CVA tenderness and negative Murphy's sign.  Musculoskeletal: She exhibits no edema.  Skin: Skin is warm and dry. No rash noted.       Assessment & Plan:

## 2013-09-28 NOTE — Progress Notes (Signed)
Pre-visit discussion using our clinic review tool. No additional management support is needed unless otherwise documented below in the visit note.  

## 2013-10-02 LAB — URINE CULTURE: Colony Count: >=100000

## 2013-10-07 ENCOUNTER — Other Ambulatory Visit: Payer: Self-pay | Admitting: *Deleted

## 2013-10-07 MED ORDER — OMEPRAZOLE 40 MG PO CPDR: 40.0000 mg | DELAYED_RELEASE_CAPSULE | Freq: Every day | ORAL | Status: DC

## 2013-10-18 ENCOUNTER — Ambulatory Visit (INDEPENDENT_AMBULATORY_CARE_PROVIDER_SITE_OTHER): Payer: Medicare Other | Admitting: Internal Medicine

## 2013-10-18 ENCOUNTER — Encounter: Payer: Self-pay | Admitting: Internal Medicine

## 2013-10-18 VITALS — BP 130/74 | HR 81 | Temp 99.1°F | Wt 186.2 lb

## 2013-10-18 DIAGNOSIS — R52 Pain, unspecified: Secondary | ICD-10-CM

## 2013-10-18 DIAGNOSIS — R05 Cough: Secondary | ICD-10-CM | POA: Diagnosis not present

## 2013-10-18 DIAGNOSIS — J029 Acute pharyngitis, unspecified: Secondary | ICD-10-CM

## 2013-10-18 DIAGNOSIS — B349 Viral infection, unspecified: Secondary | ICD-10-CM

## 2013-10-18 DIAGNOSIS — R509 Fever, unspecified: Secondary | ICD-10-CM | POA: Diagnosis not present

## 2013-10-18 DIAGNOSIS — B9789 Other viral agents as the cause of diseases classified elsewhere: Secondary | ICD-10-CM

## 2013-10-18 DIAGNOSIS — R059 Cough, unspecified: Secondary | ICD-10-CM | POA: Diagnosis not present

## 2013-10-18 NOTE — Progress Notes (Signed)
Pre-visit discussion using our clinic review tool. No additional management support is needed unless otherwise documented below in the visit note.  

## 2013-10-18 NOTE — Progress Notes (Addendum)
HPI  Pt presents to the clinic today with c/o cough, chills, body, aches and fevers. She reports this started 2 days ago. The cough is non productive. She has tried tylenol, Ibuprofen, and Nyquil with little relief. She has had sick contacts. She did not get her flu shot this.  Review of Systems      Past Medical History  Diagnosis Date  . Arthritis     neck pain and numbness left digits Vertell Limber)  . Depression     failed paxil, lexapro, cymbalta, zoloft, pristiq  . GERD (gastroesophageal reflux disease)   . Seasonal allergies   . HTN (hypertension)   . Migraines     occasional, stress related  . OSA (obstructive sleep apnea)     mild, no CPAP  . Hypothyroid   . Colon polyps     last colonoscopy 2006? rpt due  . History of diverticulitis of colon     s/p resection of large intestine, rpt itis 02/2012  . Postmenopausal     HRT compound, previously premarin  . Fatty liver 02/2012    on CT scan 02/2012  . Diverticulitis   . Vertebral compression fracture 07/2012    L1 25% Vertell Limber) released without intervention  . Chronic lower back pain     persistent after L1 cmp fx, with multilevel mild lumbar DDD - good resolution after L L5/S1 ESI ([facility])    Family History  Problem Relation Age of Onset  . Coronary artery disease Mother   . Arthritis Mother   . Heart disease Mother   . Coronary artery disease Father 23    CAD/MI  . Hypertension Father   . Heart disease Father   . Diabetes Sister   . Hypertension Sister   . Hypertension Brother   . Cancer Neg Hx     History   Social History  . Marital Status: Married    Spouse Name: N/A    Number of Children: 2  . Years of Education: HS   Occupational History  .     Social History Main Topics  . Smoking status: Never Smoker   . Smokeless tobacco: Never Used  . Alcohol Use: Yes     Comment: rare  . Drug Use: No  . Sexual Activity: Not on file   Other Topics Concern  . Not on file   Social History Narrative   Caffeine: 2-3 cups/day (1-2 coffee)   Lives with husband and 59yo son, 4 dogs   Activity: no scheduled   Diet: lots of water, fruits/vegetable    Allergies  Allergen Reactions  . Cymbalta [Duloxetine Hcl] Other (See Comments)    suicidality     Constitutional: Positive headache, fatigue and fever. Denies abrupt weight changes.  HEENT:  Positive sore throat. Denies eye redness, eye pain, pressure behind the eyes, facial pain, nasal congestion, ear pain, ringing in the ears, wax buildup, runny nose or bloody nose. Respiratory: Positive cough. Denies difficulty breathing or shortness of breath.  Cardiovascular: Denies chest pain, chest tightness, palpitations or swelling in the hands or feet.   No other specific complaints in a complete review of systems (except as listed in HPI above).  Objective:   BP 130/74  Pulse 81  Temp(Src) 99.1 F (37.3 C) (Oral)  Wt 186 lb 4 oz (84.482 kg)  SpO2 98% Wt Readings from Last 3 Encounters:  10/18/13 186 lb 4 oz (84.482 kg)  09/28/13 186 lb 4 oz (84.482 kg)  08/12/13 185 lb (83.915 kg)  General: Appears her stated age, well developed, well nourished in NAD. HEENT: Head: normal shape and size; Eyes: sclera white, no icterus, conjunctiva pink, PERRLA and EOMs intact; Ears: Tm's gray and intact, normal light reflex; Nose: mucosa pink and moist, septum midline; Throat/Mouth: + PND. Teeth present, mucosa erythematous and moist, no exudate noted, no lesions or ulcerations noted.  Neck: Mild cervical lymphadenopathy. Neck supple, trachea midline. No massses, lumps or thyromegaly present.  Cardiovascular: Normal rate and rhythm. S1,S2 noted.  No murmur, rubs or gallops noted. No JVD or BLE edema. No carotid bruits noted. Pulmonary/Chest: Normal effort and positive vesicular breath sounds. No respiratory distress. No wheezes, rales or ronchi noted.      Assessment & Plan:   Viral illness:  Rapid Flu: negative Rapid Strep: negative  Get some  rest and drink plenty of water Do salt water gargles for the sore throat Continue supportive care with tylenol and ibuprofen  RTC as needed or if symptoms persist.

## 2013-10-18 NOTE — Patient Instructions (Signed)
Viral Infections °A virus is a type of germ. Viruses can cause: °· Minor sore throats. °· Aches and pains. °· Headaches. °· Runny nose. °· Rashes. °· Watery eyes. °· Tiredness. °· Coughs. °· Loss of appetite. °· Feeling sick to your stomach (nausea). °· Throwing up (vomiting). °· Watery poop (diarrhea). °HOME CARE  °· Only take medicines as told by your doctor. °· Drink enough water and fluids to keep your pee (urine) clear or pale yellow. Sports drinks are a good choice. °· Get plenty of rest and eat healthy. Soups and broths with crackers or rice are fine. °GET HELP RIGHT AWAY IF:  °· You have a very bad headache. °· You have shortness of breath. °· You have chest pain or neck pain. °· You have an unusual rash. °· You cannot stop throwing up. °· You have watery poop that does not stop. °· You cannot keep fluids down. °· You or your child has a temperature by mouth above 102° F (38.9° C), not controlled by medicine. °· Your baby is older than 3 months with a rectal temperature of 102° F (38.9° C) or higher. °· Your baby is 3 months old or younger with a rectal temperature of 100.4° F (38° C) or higher. °MAKE SURE YOU:  °· Understand these instructions. °· Will watch this condition. °· Will get help right away if you are not doing well or get worse. °Document Released: 08/08/2008 Document Revised: 11/18/2011 Document Reviewed: 01/01/2011 °ExitCare® Patient Information ©2014 [employer]. ° °

## 2013-10-21 ENCOUNTER — Telehealth: Payer: Self-pay

## 2013-10-21 NOTE — Telephone Encounter (Signed)
Will see then. 

## 2013-10-21 NOTE — Telephone Encounter (Signed)
Pt seen on 10/18/13; pt not feeling any better and now prod cough with yellow phlegm, fever 99.9,head and chest congestion. No wheezing and no SOB more than usual.[facility]. Pt scheduled appt with Dr Darnell Level for 10/22/13 at 12 noon.

## 2013-10-22 ENCOUNTER — Ambulatory Visit (INDEPENDENT_AMBULATORY_CARE_PROVIDER_SITE_OTHER): Payer: Medicare Other | Admitting: Family Medicine

## 2013-10-22 ENCOUNTER — Encounter: Payer: Self-pay | Admitting: Family Medicine

## 2013-10-22 ENCOUNTER — Other Ambulatory Visit: Payer: Self-pay | Admitting: Family Medicine

## 2013-10-22 VITALS — BP 148/94 | HR 80 | Temp 98.3°F | Wt 188.5 lb

## 2013-10-22 DIAGNOSIS — J019 Acute sinusitis, unspecified: Secondary | ICD-10-CM | POA: Diagnosis not present

## 2013-10-22 DIAGNOSIS — J0191 Acute recurrent sinusitis, unspecified: Secondary | ICD-10-CM | POA: Insufficient documentation

## 2013-10-22 LAB — POCT INFLUENZA A/B
Influenza A, POC: NEGATIVE
Influenza B, POC: NEGATIVE

## 2013-10-22 LAB — POCT RAPID STREP A (OFFICE): RAPID STREP A SCREEN: NEGATIVE

## 2013-10-22 MED ORDER — AMOXICILLIN-POT CLAVULANATE 875-125 MG PO TABS: 1.0000 | ORAL_TABLET | Freq: Two times a day (BID) | ORAL | Status: AC

## 2013-10-22 MED ORDER — GUAIFENESIN-CODEINE 100-10 MG/5ML PO SYRP: 5.0000 mL | ORAL_SOLUTION | Freq: Two times a day (BID) | ORAL | Status: DC | PRN

## 2013-10-22 NOTE — Telephone Encounter (Signed)
Spoke with patient and she would like the cough syrup. Called in as directed/requested.

## 2013-10-22 NOTE — Assessment & Plan Note (Signed)
Given progression and duration of sxs, may be developing into bacterial sinusitis. Discussed if viral should improve in next few days and see improvement.  If worsening or not improved ,provided with augmentin course to fill. Pt agrees with plan. H/o recurrent sinusitis late last year - will monitor closely for improvement on current regimen.

## 2013-10-22 NOTE — Patient Instructions (Signed)
You have a sinus infection, but could be viral. Push fluids and plenty of rest. Nasal saline irrigation or neti pot to help drain sinuses. Continue ibuprofen 600mg  twice daily with food. Continue flonase. May use simple mucinex with plenty of fluid to help mobilize mucous. If not improved in next 1-2 days, fill antibiotic (provided today). Let us know if fever >101.5, trouble opening/closing mouth, difficulty swallowing, or worsening - you may need to be seen again.

## 2013-10-22 NOTE — Addendum Note (Signed)
Addended by: Lurlean Nanny on: 10/22/2013 01:45 PM   Modules accepted: Orders

## 2013-10-22 NOTE — Telephone Encounter (Signed)
I forgot to discuss cough syrup for pt at night time - plz offer and if pt interested may phone in codeine cough syrup. Placed in order section.

## 2013-10-22 NOTE — Progress Notes (Signed)
BP 148/94  Pulse 80  Temp(Src) 98.3 F (36.8 C) (Oral)  Wt 188 lb 8 oz (85.503 kg)  SpO2 97%   CC: cough  Subjective:    Patient ID: Brittany [name], female    DOB: [birth year], 58 y.o.   MRN: [id number]  HPI: Brittany [name] is a 58 y.o. female presenting on 10/22/2013 with URI  Recent UTI last month treated with 5d bactrim.  Seen here earlier in the week by our NP with 2d h/o cough with body aches and chills, dx with viral URI and supportive treatment recommended.  Feeling worse - for last week and now ears are all clogged up and feeling dizzy.  Sore throat and coughing up yellow sputum.  Persistent body aches.  Head > chest congestion, headache.  Staying short winded   Had flu and strep tested earlier this week, and pt states both were negative but no records in our chart.  Low grade temp at night time.  Cough keeping her up at night.  Trouble stopping cough - coughing fits. Son with similar illness - has missed school. Taking mucinex, nyquil, flonase.  Nothing is helping.  Relevant past medical, surgical, family and social history reviewed and updated. Allergies and medications reviewed and updated. Current Outpatient Prescriptions on File Prior to Visit  Medication Sig  . buPROPion (WELLBUTRIN SR) 200 MG 12 hr tablet Take 200 mg by mouth daily.  . fish oil-omega-3 fatty acids 1000 MG capsule Take 1 g by mouth daily.  . fluticasone (FLONASE) 50 MCG/ACT nasal spray Place 2 sprays into both nostrils 2 (two) times daily. In each nostril  . ibuprofen (ADVIL,MOTRIN) 200 MG tablet Take 600 mg by mouth as needed.   Marland Kitchen levothyroxine (SYNTHROID, LEVOTHROID) 112 MCG tablet TAKE 1 TABLET DAILY  . losartan (COZAAR) 50 MG tablet TAKE 1 TABLET DAILY  . Multiple Minerals-Vitamins (CITRACAL PLUS PO) Take 1 tablet by mouth 2 (two) times daily. Total of 1600mg  calcium and 2000 IU vit D  . Multiple Vitamins-Minerals (ALIVE WOMENS ENERGY PO) Take 1 tablet by mouth daily.    Marland Kitchen omeprazole (PRILOSEC) 40 MG  capsule Take 1 capsule (40 mg total) by mouth daily.   No current facility-administered medications on file prior to visit.    Review of Systems Per HPI unless specifically indicated above    Objective:    BP 148/94  Pulse 80  Temp(Src) 98.3 F (36.8 C) (Oral)  Wt 188 lb 8 oz (85.503 kg)  SpO2 97%  Physical Exam  Nursing note and vitals reviewed. Constitutional: She appears well-developed and well-nourished. No distress.  HENT:  Head: Normocephalic and atraumatic.  Right Ear: Hearing, tympanic membrane, external ear and ear canal normal.  Left Ear: Hearing, tympanic membrane, external ear and ear canal normal.  Nose: Mucosal edema present. No rhinorrhea. Right sinus exhibits no maxillary sinus tenderness and no frontal sinus tenderness. Left sinus exhibits no maxillary sinus tenderness and no frontal sinus tenderness.  Mouth/Throat: Uvula is midline and mucous membranes are normal. No oropharyngeal exudate, posterior oropharyngeal edema, posterior oropharyngeal erythema or tonsillar abscesses.  Evidently congested nasally  Eyes: Conjunctivae and EOM are normal. Pupils are equal, round, and reactive to light. No scleral icterus.  Neck: Normal range of motion. Neck supple.  Cardiovascular: Normal rate, regular rhythm, normal heart sounds and intact distal pulses.   No murmur heard. Pulmonary/Chest: Effort normal and breath sounds normal. No respiratory distress. She has no wheezes. She has no rales.  Lymphadenopathy:    She  has no cervical adenopathy.  Skin: Skin is warm and dry. No rash noted.      Assessment & Plan:   Problem List Items Addressed This Visit   Acute sinusitis - Primary     Given progression and duration of sxs, may be developing into bacterial sinusitis. Discussed if viral should improve in next few days and see improvement.  If worsening or not improved ,provided with augmentin course to fill. Pt agrees with plan. H/o recurrent sinusitis late last year -  will monitor closely for improvement on current regimen.    Relevant Medications      amoxicillin-clavulanate (AUGMENTIN) tablet 875-125 mg       Follow up plan: Return if symptoms worsen or fail to improve.

## 2013-10-22 NOTE — Progress Notes (Signed)
Pre-visit discussion using our clinic review tool. No additional management support is needed unless otherwise documented below in the visit note.  

## 2013-11-17 DIAGNOSIS — J310 Chronic rhinitis: Secondary | ICD-10-CM | POA: Diagnosis not present

## 2013-12-06 ENCOUNTER — Ambulatory Visit: Payer: Self-pay | Admitting: Podiatry

## 2013-12-16 DIAGNOSIS — J329 Chronic sinusitis, unspecified: Secondary | ICD-10-CM | POA: Diagnosis not present

## 2013-12-16 DIAGNOSIS — J301 Allergic rhinitis due to pollen: Secondary | ICD-10-CM | POA: Diagnosis not present

## 2013-12-17 ENCOUNTER — Ambulatory Visit: Payer: Self-pay

## 2013-12-20 ENCOUNTER — Ambulatory Visit (INDEPENDENT_AMBULATORY_CARE_PROVIDER_SITE_OTHER): Payer: Medicare Other

## 2013-12-20 VITALS — BP 111/68 | HR 79 | Resp 18

## 2013-12-20 DIAGNOSIS — R52 Pain, unspecified: Secondary | ICD-10-CM

## 2013-12-20 DIAGNOSIS — M1990 Unspecified osteoarthritis, unspecified site: Secondary | ICD-10-CM

## 2013-12-20 DIAGNOSIS — M2020 Hallux rigidus, unspecified foot: Secondary | ICD-10-CM

## 2013-12-20 DIAGNOSIS — M779 Enthesopathy, unspecified: Secondary | ICD-10-CM

## 2013-12-20 DIAGNOSIS — M2010 Hallux valgus (acquired), unspecified foot: Secondary | ICD-10-CM | POA: Diagnosis not present

## 2013-12-20 DIAGNOSIS — M7750 Other enthesopathy of unspecified foot: Secondary | ICD-10-CM | POA: Diagnosis not present

## 2013-12-20 DIAGNOSIS — M778 Other enthesopathies, not elsewhere classified: Secondary | ICD-10-CM

## 2013-12-20 NOTE — Progress Notes (Signed)
   Subjective:    Patient ID: Brittany [name], female    DOB: [birth year], 58 y.o.   MRN: [id number]  HPI  The bunions on both feet have been going on for about a year and I buy wide shoes and put pads inbetween toes and sore and tender and hurts with shoes and the left is worse and has more pain    Review of Systems  Constitutional: Positive for fatigue.  HENT: Positive for sinus pressure.   Eyes: Positive for itching.  Respiratory: Positive for cough.   Cardiovascular:       Calf pain with walking  Endocrine:       Excessive thirst  Musculoskeletal: Positive for back pain.       Joint and muscle pain   Allergic/Immunologic: Positive for environmental allergies.  All other systems reviewed and are negative.      Objective:   Physical Exam Vascular status is intact with pedal pulses palpable DP postal for PT plus one over 4 bilateral Refill time 3 seconds all digits skin temperature warm turgor normal no edema pallor or varicosities neurologically epicritic and proprioceptive sensations appear to be intact is normal plantar response DTRs not listed neurologically skin color pigment normal hair growth absent orthopedic biomechanical exam reveals rectus foot type with promontory changes noted there is significant bunion or dorsal and medial bunion noted left more so than right more dorsal bunion patient has greatly reduced range of motion possibly 5-10 total range of motion both hallux left more so than right there is hallux extensors at the IP joint noted to the rigid contracture at the MTP joint. 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

## 2013-12-20 NOTE — Patient Instructions (Signed)
Hallux Rigidus Hallux rigidus is a condition involving pain and a loss of motion of the first (big) toe. The pain gets worse with lifting up (extension) of the toe. This is usually due to arthritic bony bumps (spurring) of the joint at the base of the big toe.  SYMPTOMS   Pain, with lifting up of the toe.  Tenderness over the joint where the big toe meets the foot.  Redness, swelling, and warmth over the top of the base of the big toe (sometimes).  Foot pain, stiffness, and limping. CAUSES  Halllux rigidus is caused by arthritis of the joint where the big toe meets the foot. The arthritis creates a bone spur that pinches the soft tissues, when the toe is extended. RISK INCREASES WITH:  Tight shoes, with a narrow toe box.  Family history of foot problems.  Gout and rheumatoid and psoriatic arthritis.  History of previous toe injury, including "turf toe."  Long first toe, flat feet, and other big toe bony bumps.  Arthritis of the big toe. PREVENTION   Wear wide toed shoes that fit well.  Tape the big toe, to reduce motion and to prevent pinching of the tissues between the bone.  Maintain physical fitness:  Foot and ankle flexibility.  Muscle strength and endurance. PROGNOSIS  This condition can usually be managed with proper treatment. However, surgery is typically required to prevent the problem from recurring.  RELATED COMPLICATIONS  Injury to other areas of the foot or ankle, caused by abnormal walking in an attempt to avoid the pain felt when walking normally. TREATMENT Treatment first involves stopping the activities that aggravate your symptoms. Ice and medicine can be used to reduce the pain and inflammation. Modifications to shoes may help reduce pain, including wearing stiff-soled shoes, shoes with a wide toe box, inserting a padded donut to relieve pressure on top of the joint, or wearing an arch support. Corticosteroid injections may be given to reduce inflammation.  If non-surgical treatment is unsuccessful, surgery may be needed. Surgical options include removing the arthritic bony spur, cutting a bone in the foot to change the arc of motion (allowing the toe to extend more), or fusion of the joint (eliminating all motion in the joint at the base of the big toe).  MEDICATION   If pain medicine is needed, nonsteroidal anti-inflammatory medicines (aspirin and ibuprofen), or other minor pain relievers (acetaminophen), are often advised.  Do not take pain medicine for 7 days before surgery.  Prescription pain relievers are usually prescribed only after surgery. Use only as directed and only as much as you need.  Ointments for arthritis, applied to the skin, may give some relief.  Injections of corticosteroids may be given to reduce inflammation. HEAT AND COLD  Cold treatment (icing) relieves pain and reduces inflammation. Cold treatment should be applied for 10 to 15 minutes every 2 to 3 hours, and immediately after activity that aggravates your symptoms. Use ice packs or an ice massage.  Heat treatment may be used before performing the stretching and strengthening activities prescribed by your caregiver, physical therapist, or athletic trainer. Use a heat pack or a warm water soak. SEEK MEDICAL CARE IF:   Symptoms get worse or do not improve in 2 weeks, despite treatment.  After surgery you develop fever, increasing pain, redness, swelling, drainage of fluids, bleeding, or increasing warmth.  New, unexplained symptoms develop. (Drugs used in treatment may produce side effects.) Document Released: 08/26/2005 Document Revised: 11/18/2011 Document Reviewed: 12/08/2008 ExitCare Patient   Information 2014 [place], [place].    Shoe recommendations maintain a stiff soled shoe one that does not bend easily. Also Ruby toebox as recommended. Athletic shoe such as new balance, Rolena Infante, ASICS are good choices for athletic shoe as well as the sketchers with a  rocker-bottom  Nonathletic shoes consider clogs such as dansco, or Birkenstock

## 2013-12-21 DIAGNOSIS — M545 Low back pain, unspecified: Secondary | ICD-10-CM | POA: Diagnosis not present

## 2013-12-22 ENCOUNTER — Telehealth: Payer: Self-pay | Admitting: *Deleted

## 2013-12-22 NOTE — Telephone Encounter (Signed)
I saw Dr. Blenda Mounts the other day.  He asked me to go home and think about having surgery and give him a call once I decided I want it.  I want to go ahead and schedule the surgery.  I asked if she has signed consent forms.  She stated no.  I informed her she would have to come in for an appointment for a consultation.  She asked what's that for.  I told her to sign consent forms and get a surgcal packet.  I transferred her to a scheduler.

## 2013-12-24 ENCOUNTER — Ambulatory Visit (INDEPENDENT_AMBULATORY_CARE_PROVIDER_SITE_OTHER): Payer: Medicare Other

## 2013-12-24 VITALS — BP 115/74 | HR 78 | Resp 18

## 2013-12-24 DIAGNOSIS — M2010 Hallux valgus (acquired), unspecified foot: Secondary | ICD-10-CM

## 2013-12-24 DIAGNOSIS — M2020 Hallux rigidus, unspecified foot: Secondary | ICD-10-CM

## 2013-12-24 DIAGNOSIS — M1990 Unspecified osteoarthritis, unspecified site: Secondary | ICD-10-CM

## 2013-12-24 DIAGNOSIS — R52 Pain, unspecified: Secondary | ICD-10-CM

## 2013-12-24 NOTE — Progress Notes (Signed)
   Subjective:    Patient ID: Brittany [name], female    DOB: [birth year], 58 y.o.   MRN: [id number]  HPI I am ready to have the surgery on my left foot    Review of Systems no systemic changes or findings at this time.     Objective:   Physical Exam Neurovascular status is intact pedal pulses palpable epicritic and proprioceptive sensations intact and symmetric bilateral patient was less than 10 range of motion left great toe joint painful tender symptomatic negative dorsiflexion or plantarflexion his extensors at hallux IP joint noted as well. At this time patient is a red literature risks come patient returns reviewed all questions asked by the patient answered she is ready for surgical intervention at this time in the form of the first MTP joint fusion with screw fixation of the left great toe joint. 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 Surgery scheduled her continues with appropriate postop followup. The office next  Harriet Masson DPM

## 2013-12-24 NOTE — Patient Instructions (Signed)
Pre-Operative Instructions  Congratulations, you have decided to take an important step to improving your quality of life.  You can be assured that the doctors of [facility] will be with you every step of the way.  1. Plan to be at the surgery center/hospital at least 1 (one) hour prior to your scheduled time unless otherwise directed by the surgical center/hospital staff.  You must have a responsible adult accompany you, remain during the surgery and drive you home.  Make sure you have directions to the surgical center/hospital and know how to get there on time. 2. For hospital based surgery you will need to obtain a history and physical form from your family physician within 1 month prior to the date of surgery- we will give you a form for you primary physician.  3. We make every effort to accommodate the date you request for surgery.  There are however, times where surgery dates or times have to be moved.  We will contact you as soon as possible if a change in schedule is required.   4. No Aspirin/Ibuprofen for one week before surgery.  If you are on aspirin, any non-steroidal anti-inflammatory medications (Mobic, Aleve, Ibuprofen) you should stop taking it 7 days prior to your surgery.  You make take Tylenol  For pain prior to surgery.  5. Medications- If you are taking daily heart and blood pressure medications, seizure, reflux, allergy, asthma, anxiety, pain or diabetes medications, make sure the surgery center/hospital is aware before the day of surgery so they may notify you which medications to take or avoid the day of surgery. 6. No food or drink after midnight the night before surgery unless directed otherwise by surgical center/hospital staff. 7. No alcoholic beverages 24 hours prior to surgery.  No smoking 24 hours prior to or 24 hours after surgery. 8. Wear loose pants or shorts- loose enough to fit over bandages, boots, and casts. 9. No slip on shoes, sneakers are best. 10. Bring  your boot with you to the surgery center/hospital.  Also bring crutches or a walker if your physician has prescribed it for you.  If you do not have this equipment, it will be provided for you after surgery. 11. If you have not been contracted by the surgery center/hospital by the day before your surgery, call to confirm the date and time of your surgery. 12. Leave-time from work may vary depending on the type of surgery you have.  Appropriate arrangements should be made prior to surgery with your employer. 13. Prescriptions will be provided immediately following surgery by your doctor.  Have these filled as soon as possible after surgery and take the medication as directed. 14. Remove nail polish on the operative foot. 15. Wash the night before surgery.  The night before surgery wash the foot and leg well with the antibacterial soap provided and water paying special attention to beneath the toenails and in between the toes.  Rinse thoroughly with water and dry well with a towel.  Perform this wash unless told not to do so by your physician.  Enclosed: 1 Ice pack (please put in freezer the night before surgery)   1 Hibiclens skin cleaner   Pre-op Instructions  If you have any questions regarding the instructions, do not hesitate to call our office.  [place]: [address] [place], [place] [address] [phone]  [place]: [address] [place], [place] [address] [phone]  [place]: [address]  [place], [place] [address] [phone]  Dr. Jahzara Slattery   Tuchman DPM, Dr. Norman Regal DPM Dr. Nikala Walsworth DPM, Dr. M. Todd Hyatt DPM, Dr. Kathryn Egerton DPM 

## 2014-01-02 ENCOUNTER — Encounter: Payer: Self-pay | Admitting: Family Medicine

## 2014-01-06 DIAGNOSIS — M5137 Other intervertebral disc degeneration, lumbosacral region: Secondary | ICD-10-CM | POA: Diagnosis not present

## 2014-01-07 HISTORY — PX: FOOT SURGERY: SHX648

## 2014-01-10 DIAGNOSIS — M19079 Primary osteoarthritis, unspecified ankle and foot: Secondary | ICD-10-CM | POA: Diagnosis not present

## 2014-01-10 DIAGNOSIS — I10 Essential (primary) hypertension: Secondary | ICD-10-CM | POA: Diagnosis not present

## 2014-01-10 DIAGNOSIS — M25579 Pain in unspecified ankle and joints of unspecified foot: Secondary | ICD-10-CM | POA: Diagnosis not present

## 2014-01-10 DIAGNOSIS — D1630 Benign neoplasm of short bones of unspecified lower limb: Secondary | ICD-10-CM | POA: Diagnosis not present

## 2014-01-10 DIAGNOSIS — G8918 Other acute postprocedural pain: Secondary | ICD-10-CM | POA: Diagnosis not present

## 2014-01-10 DIAGNOSIS — M2020 Hallux rigidus, unspecified foot: Secondary | ICD-10-CM | POA: Diagnosis not present

## 2014-01-14 ENCOUNTER — Telehealth: Payer: Self-pay | Admitting: *Deleted

## 2014-01-14 MED ORDER — TRAMADOL HCL 50 MG PO TABS: 50.0000 mg | ORAL_TABLET | Freq: Three times a day (TID) | ORAL | Status: DC

## 2014-01-14 NOTE — Telephone Encounter (Signed)
Had surgery on Monday.  Pain medicine, Percocet, is making me very sick, nausea and throwing up.  Is there something else he can possibly give me?

## 2014-01-14 NOTE — Telephone Encounter (Signed)
I called and informed the patient that Dr. Blenda Mounts wrote her a prescription for Tramadol.  I told her it would have to be picked up.  She stated her husband would come by to pick it up for her.  I asked for his name.  She stated [name] Mcclane.  I informed her he would have to show his identification in order to pick it up.  She stated okay.

## 2014-01-17 DIAGNOSIS — J309 Allergic rhinitis, unspecified: Secondary | ICD-10-CM | POA: Diagnosis not present

## 2014-01-17 DIAGNOSIS — J329 Chronic sinusitis, unspecified: Secondary | ICD-10-CM | POA: Diagnosis not present

## 2014-01-18 ENCOUNTER — Ambulatory Visit (INDEPENDENT_AMBULATORY_CARE_PROVIDER_SITE_OTHER): Payer: Medicare Other

## 2014-01-18 VITALS — BP 130/74 | HR 72 | Resp 12

## 2014-01-18 DIAGNOSIS — M2020 Hallux rigidus, unspecified foot: Secondary | ICD-10-CM

## 2014-01-18 DIAGNOSIS — Z09 Encounter for follow-up examination after completed treatment for conditions other than malignant neoplasm: Secondary | ICD-10-CM

## 2014-01-18 NOTE — Patient Instructions (Signed)

## 2014-01-18 NOTE — Progress Notes (Signed)
   Subjective:    Patient ID: Brittany [name], female    DOB: 1955/11/24, 58 y.o.   MRN: [id number]  HPI Comments: ''LT FOOT IS THROBBING AND SWOLLEN.''  DOS 01/10/14 HALLUX MPJ FUSION LT   Review of Systems no systemic findings or changes noted    Objective:   Physical Exam Neurovascular status is intact pedal pulses palpable epicritic and proprioceptive sensations intact and symmetric. Patient's and throbbing from swelling is consistent with postop course and difficult time with her narcotic pain medications this was done a plane ibuprofen at this time. Neurovascular status is intact pedal pulses are palpable epicritic sensations intact incision well coapted dressings intact and dry x-rays reveal good alignment of the screw fixation and first MTP joint arthroplasty on the fusion site is well aligned with some adequate dorsiflexion and a rectus hallux being noted clinically and radiographically. There since shorting noted by the patient advised at this time to have been because of the removal joint surfaces and alignment.      Assessment & Plan:  Assessment good postop progress presto compressive dressing reapplied maintain nonweightbearing for at least 5 more weeks months maintain air fracture walker or crutches or rollabout which she's using at this time. Patient otherwise doing well and stable presto compressive dressing reapplied reappointed one week for dressing change maintain structures as is applied air fracture boot to be maintained all times with compression to reduce edema recheck in one week for postop followup  Harriet Masson DPM

## 2014-01-25 ENCOUNTER — Ambulatory Visit (INDEPENDENT_AMBULATORY_CARE_PROVIDER_SITE_OTHER): Payer: Medicare Other

## 2014-01-25 VITALS — BP 109/72 | HR 87 | Resp 16 | Ht 64.0 in | Wt 185.0 lb

## 2014-01-25 DIAGNOSIS — Z09 Encounter for follow-up examination after completed treatment for conditions other than malignant neoplasm: Secondary | ICD-10-CM

## 2014-01-25 DIAGNOSIS — M2020 Hallux rigidus, unspecified foot: Secondary | ICD-10-CM

## 2014-01-25 DIAGNOSIS — M2010 Hallux valgus (acquired), unspecified foot: Secondary | ICD-10-CM

## 2014-01-25 NOTE — Patient Instructions (Signed)
ICE INSTRUCTIONS  Apply ice or cold pack to the affected area at least 3 times a day for 10-15 minutes each time.  You should also use ice after prolonged activity or vigorous exercise.  Do not apply ice longer than 20 minutes at one time.  Always keep a cloth between your skin and the ice pack to prevent burns.  Being consistent and following these instructions will help control your symptoms.  We suggest you purchase a gel ice pack because they are reusable and do bit leak.  Some of them are designed to wrap around the area.  Use the method that works best for you.  Here are some other suggestions for icing.   Use a frozen bag of peas or corn-inexpensive and molds well to your body, usually stays frozen for 10 to 20 minutes.  Wet a towel with cold water and squeeze out the excess until it's damp.  Place in a bag in the freezer for 20 minutes. Then remove and use.  Maintain ice hockey, swelling also Neosporin to the incision or any antibiotic ointment including cocoa butter can be utilized to help with scar incision site maintain the compression stocking and boot during the day maintain nonweightbearing for 4 more weeks as instructed

## 2014-01-25 NOTE — Progress Notes (Signed)
   Subjective:    Patient ID: Brittany [name], female    DOB: 17-Jul-1956, 58 y.o.   MRN: [id number]  HPI Comments: Pt states the foot still occasionally burns and spasms at times due to not being able to move.  Pt states the dressing rubs across the left 1st toe dorsal.     Review of Systems no new systemic findings or change     Objective:   Physical Exam Neurovascular status is intact pedal pulses palpable epicritic and proprioceptive sensations intact incision clean dry well coapted dry sterile dressing removed maintain his the Coflex dressing or an anklet which is dispensed at this time with Neosporin cocoa butter to the incision keep nonweightbearing for 4 more weeks maintain the rollabout knee walker as instructed       Assessment & Plan:  Assessment good postop progress reappointed 4 weeks for followup x-rays and reevaluation postop visit  Harriet Masson DPM

## 2014-02-22 ENCOUNTER — Ambulatory Visit (INDEPENDENT_AMBULATORY_CARE_PROVIDER_SITE_OTHER): Payer: Medicare Other

## 2014-02-22 VITALS — BP 133/67 | HR 82 | Resp 16 | Ht 64.0 in | Wt 185.0 lb

## 2014-02-22 DIAGNOSIS — M2010 Hallux valgus (acquired), unspecified foot: Secondary | ICD-10-CM

## 2014-02-22 DIAGNOSIS — M1990 Unspecified osteoarthritis, unspecified site: Secondary | ICD-10-CM

## 2014-02-22 DIAGNOSIS — Z09 Encounter for follow-up examination after completed treatment for conditions other than malignant neoplasm: Secondary | ICD-10-CM

## 2014-02-22 DIAGNOSIS — M2020 Hallux rigidus, unspecified foot: Secondary | ICD-10-CM

## 2014-02-22 NOTE — Patient Instructions (Signed)
ICE INSTRUCTIONS  Apply ice or cold pack to the affected area at least 3 times a day for 10-15 minutes each time.  You should also use ice after prolonged activity or vigorous exercise.  Do not apply ice longer than 20 minutes at one time.  Always keep a cloth between your skin and the ice pack to prevent burns.  Being consistent and following these instructions will help control your symptoms.  We suggest you purchase a gel ice pack because they are reusable and do bit leak.  Some of them are designed to wrap around the area.  Use the method that works best for you.  Here are some other suggestions for icing.   Use a frozen bag of peas or corn-inexpensive and molds well to your body, usually stays frozen for 10 to 20 minutes.  Wet a towel with cold water and squeeze out the excess until it's damp.  Place in a bag in the freezer for 20 minutes. Then remove and use.  May discontinue the rollabout walker and start weightbearing with the air fracture boot in place. Use a cane for walking for steadiness if needed   Reappointed 4 weeks for followup x-ray and possible discontinue use a boot that there

## 2014-02-22 NOTE — Progress Notes (Addendum)
   Subjective:    Patient ID: Brittany [name], female    DOB: 1956/08/26, 59 y.o.   MRN: [id number]  HPI Comments: Pt states she's doing well, but has some burning,  Pt states she's ready to walk.     Review of Systems no new systemic changes or findings noted     Objective:   Physical Exam Neurovascular status is intact pedal pulses are palpable epicritic and proprioceptive sensations intact the incision site still has a slight eschar over there is a slight dehiscence no active discharge drainage no bleeding no increased temperature no signs of infections been applying Neosporin and a light gauze pad to the area x-rays reveal good position the screw fixations no displacements. Consolidation of the first MTP fusion site. Patient does have some burning and stinging sensations and still some numbness in her toes maybe sequela of swelling the foot and swells or peripheral nerve block. However there is no increased temperature in the foot no signs of infection at this time       Assessment & Plan:  Assessment good postop progress following first MTP fusion left foot at this time patient may discontinue a rollabout and resume weightbearing on the foot with the assistance of a cane. Recheck in 4 weeks for further followup after that time with x-rays Richard good consolidation can discontinue boot and return to walking or athletic shoes. Contact me change difficulties maintain Neosporin to the incision and a light compressive wrap as needed  Harriet Masson DPM

## 2014-02-27 ENCOUNTER — Other Ambulatory Visit: Payer: Self-pay | Admitting: Family Medicine

## 2014-02-27 DIAGNOSIS — I10 Essential (primary) hypertension: Secondary | ICD-10-CM

## 2014-02-27 DIAGNOSIS — E039 Hypothyroidism, unspecified: Secondary | ICD-10-CM

## 2014-02-27 DIAGNOSIS — E785 Hyperlipidemia, unspecified: Secondary | ICD-10-CM

## 2014-03-01 ENCOUNTER — Other Ambulatory Visit (INDEPENDENT_AMBULATORY_CARE_PROVIDER_SITE_OTHER): Payer: Medicare Other

## 2014-03-01 DIAGNOSIS — E039 Hypothyroidism, unspecified: Secondary | ICD-10-CM

## 2014-03-01 DIAGNOSIS — E785 Hyperlipidemia, unspecified: Secondary | ICD-10-CM

## 2014-03-01 DIAGNOSIS — I10 Essential (primary) hypertension: Secondary | ICD-10-CM

## 2014-03-01 LAB — LIPID PANEL
CHOLESTEROL: 220 mg/dL — AB (ref 0–200)
HDL: 46 mg/dL (ref >39.00)
LDL Cholesterol: 140 mg/dL — ABNORMAL HIGH (ref 0–99)
NonHDL: 174
Total CHOL/HDL Ratio: 5
Triglycerides: 168 mg/dL — ABNORMAL HIGH (ref 0.0–149.0)
VLDL: 33.6 mg/dL (ref 0.0–40.0)

## 2014-03-01 LAB — RENAL FUNCTION PANEL
Albumin: 4.2 g/dL (ref 3.5–5.2)
BUN: 20 mg/dL (ref 6–23)
CO2: 26 meq/L (ref 19–32)
CREATININE: 1.1 mg/dL (ref 0.4–1.2)
Calcium: 9 mg/dL (ref 8.4–10.5)
Chloride: 106 mEq/L (ref 96–112)
GFR: 55.19 mL/min — ABNORMAL LOW (ref >60.00)
Glucose, Bld: 104 mg/dL — ABNORMAL HIGH (ref 70–99)
PHOSPHORUS: 3.4 mg/dL (ref 2.3–4.6)
Potassium: 4.3 mEq/L (ref 3.5–5.1)
Sodium: 139 mEq/L (ref 135–145)

## 2014-03-01 LAB — TSH: TSH: 3.74 u[IU]/mL (ref 0.35–4.50)

## 2014-03-08 ENCOUNTER — Encounter: Payer: Self-pay | Admitting: Family Medicine

## 2014-03-08 ENCOUNTER — Ambulatory Visit (INDEPENDENT_AMBULATORY_CARE_PROVIDER_SITE_OTHER): Payer: Medicare Other | Admitting: Family Medicine

## 2014-03-08 VITALS — BP 128/82 | HR 84 | Temp 98.3°F | Ht 64.0 in | Wt 185.5 lb

## 2014-03-08 DIAGNOSIS — F329 Major depressive disorder, single episode, unspecified: Secondary | ICD-10-CM

## 2014-03-08 DIAGNOSIS — I10 Essential (primary) hypertension: Secondary | ICD-10-CM

## 2014-03-08 DIAGNOSIS — F3289 Other specified depressive episodes: Secondary | ICD-10-CM | POA: Diagnosis not present

## 2014-03-08 DIAGNOSIS — L304 Erythema intertrigo: Secondary | ICD-10-CM | POA: Insufficient documentation

## 2014-03-08 DIAGNOSIS — E785 Hyperlipidemia, unspecified: Secondary | ICD-10-CM

## 2014-03-08 DIAGNOSIS — F32A Depression, unspecified: Secondary | ICD-10-CM

## 2014-03-08 DIAGNOSIS — Z0000 Encounter for general adult medical examination without abnormal findings: Secondary | ICD-10-CM | POA: Diagnosis not present

## 2014-03-08 DIAGNOSIS — L538 Other specified erythematous conditions: Secondary | ICD-10-CM | POA: Diagnosis not present

## 2014-03-08 DIAGNOSIS — G4700 Insomnia, unspecified: Secondary | ICD-10-CM

## 2014-03-08 DIAGNOSIS — E039 Hypothyroidism, unspecified: Secondary | ICD-10-CM

## 2014-03-08 MED ORDER — CLOTRIMAZOLE 1 % EX CREA: 1.0000 "application " | TOPICAL_CREAM | Freq: Two times a day (BID) | CUTANEOUS | Status: DC

## 2014-03-08 MED ORDER — TRAZODONE HCL 50 MG PO TABS
25.0000 mg | ORAL_TABLET | Freq: Every evening | ORAL | Status: DC | PRN
Start: 2014-03-08 — End: 2015-09-20

## 2014-03-08 NOTE — Progress Notes (Signed)
BP 128/82  Pulse 84  Temp(Src) 98.3 F (36.8 C) (Oral)  Ht 5\' 4"  (1.626 m)  Wt 185 lb 8 oz (84.142 kg)  BMI 31.83 kg/m2   CC: CPE  Subjective:    Patient ID: Brittany [name], female    DOB: 1955/11/06, 58 y.o.   MRN: [id number]  HPI: Brittany [name] is a 58 y.o. female presenting on 03/08/2014 for Annual Exam   Recent L foot surgery. Prolonged recovery  Groin itching longstanding. Has tried vaginal wipes and creams without improvement. Has changed underwear to elastic-less underwear and only cotton. No skin changes. Has scratched to the point of bleeding in the past. Has tried monistat without improvement. Has tried cortisone and neosporin.  Requests sleeping medication to help. Takes motrin 800mg  once to twice daily for sleep. Overly sensitive to narcotics. Bad reaction to percocets and tramadol. Treated as fungal infection in past which helped. Has tried melatonin and benadryl without improvement.  Passes hearing and vision screen Denies depression/falls/anhedonia  Preventative: Well woman - last when done hysterectomy (benign reason). Doesn't need rpt pap.  mammo 04/2013 WNL. Due this coming august. Will schedule. COLONOSCOPY Date: 03/2012 partial colectomy, scattered diverticulae [name], rpt 10 yrs ESOPHAGOGASTRODUODENOSCOPY Date: 03/2012 mod gastritis, benign polyps, H pylori neg Flu shot - unsure Last tetanus - 09/2011 Advanced directives: may look into this. Handout provided.   Caffeine: 2-3 cups/day (1-2 coffee) Lives with husband and 63yo son, 4 dogs Activity: no regular exercise Diet: good water, fruits/vegetable daily  Wt Readings from Last 3 Encounters:  03/08/14 185 lb 8 oz (84.142 kg)  02/22/14 185 lb (83.915 kg)  01/25/14 185 lb (83.915 kg)   Body mass index is 31.83 kg/(m^2).  Relevant past medical, surgical, family and social history reviewed and updated as indicated.  Allergies and medications reviewed and updated. Current Outpatient Prescriptions on  File Prior to Visit  Medication Sig  . ibuprofen (ADVIL,MOTRIN) 200 MG tablet Take 600 mg by mouth as needed.   Marland Kitchen levothyroxine (SYNTHROID, LEVOTHROID) 112 MCG tablet TAKE 1 TABLET DAILY  . losartan (COZAAR) 50 MG tablet TAKE 1 TABLET DAILY  . Multiple Minerals-Vitamins (CITRACAL PLUS PO) Take 1 tablet by mouth 2 (two) times daily. Total of 1600mg  calcium and 2000 IU vit D  . Multiple Vitamins-Minerals (ALIVE WOMENS ENERGY PO) Take 1 tablet by mouth daily.    Marland Kitchen omeprazole (PRILOSEC) 40 MG capsule Take 1 capsule (40 mg total) by mouth daily.  . fish oil-omega-3 fatty acids 1000 MG capsule Take 1 g by mouth daily.  . fluticasone (FLONASE) 50 MCG/ACT nasal spray Place 2 sprays into both nostrils 2 (two) times daily. In each nostril   No current facility-administered medications on file prior to visit.    Review of Systems Per HPI unless specifically indicated above    Objective:    BP 128/82  Pulse 84  Temp(Src) 98.3 F (36.8 C) (Oral)  Ht 5\' 4"  (1.626 m)  Wt 185 lb 8 oz (84.142 kg)  BMI 31.83 kg/m2  Physical Exam  Nursing note and vitals reviewed. Constitutional: She is oriented to person, place, and time. She appears well-developed and well-nourished. No distress.  HENT:  Head: Normocephalic and atraumatic.  Right Ear: Hearing, tympanic membrane, external ear and ear canal normal.  Left Ear: Hearing, tympanic membrane, external ear and ear canal normal.  Nose: Nose normal.  Mouth/Throat: Uvula is midline, oropharynx is clear and moist and mucous membranes are normal. No oropharyngeal exudate, posterior oropharyngeal edema or posterior oropharyngeal  erythema.  Eyes: Conjunctivae and EOM are normal. Pupils are equal, round, and reactive to light. No scleral icterus.  Neck: Normal range of motion. Neck supple. No thyromegaly present.  Cardiovascular: Normal rate, regular rhythm, normal heart sounds and intact distal pulses.   No murmur heard. Pulses:      Radial pulses are 2+ on  the right side, and 2+ on the left side.  Pulmonary/Chest: Effort normal and breath sounds normal. No respiratory distress. She has no wheezes. She has no rales.  Abdominal: Soft. Bowel sounds are normal. She exhibits no distension and no mass. There is no tenderness. There is no rebound and no guarding.  Musculoskeletal: Normal range of motion. She exhibits no edema.  Lymphadenopathy:    She has no cervical adenopathy.  Neurological: She is alert and oriented to person, place, and time.  CN grossly intact, station and gait intact  Skin: Skin is warm and dry. Rash noted.  Pruritic scaly rash bilateral groin creases with some peeling  Psychiatric: She has a normal mood and affect. Her behavior is normal. Judgment and thought content normal.   Results for orders placed in visit on 03/01/14  LIPID PANEL      Result Value Ref Range   Cholesterol 220 (*) 0 - 200 mg/dL   Triglycerides 168.0 (*) 0.0 - 149.0 mg/dL   HDL 46.00  >39.00 mg/dL   VLDL 33.6  0.0 - 40.0 mg/dL   LDL Cholesterol 140 (*) 0 - 99 mg/dL   Total CHOL/HDL Ratio 5     NonHDL 174.00    TSH      Result Value Ref Range   TSH 3.74  0.35 - 4.50 uIU/mL  RENAL FUNCTION PANEL      Result Value Ref Range   Sodium 139  135 - 145 mEq/L   Potassium 4.3  3.5 - 5.1 mEq/L   Chloride 106  96 - 112 mEq/L   CO2 26  19 - 32 mEq/L   Calcium 9.0  8.4 - 10.5 mg/dL   Albumin 4.2  3.5 - 5.2 g/dL   BUN 20  6 - 23 mg/dL   Creatinine, Ser 1.1  0.4 - 1.2 mg/dL   Glucose, Bld 104 (*) 70 - 99 mg/dL   Phosphorus 3.4  2.3 - 4.6 mg/dL   GFR 55.19 (*) >60.00 mL/min      Assessment & Plan:   Problem List Items Addressed This Visit   Medicare annual wellness visit, initial     I have personally reviewed the Medicare Annual Wellness questionnaire and have noted 1. The patient's medical and social history 2. Their use of alcohol, tobacco or illicit drugs 3. Their current medications and supplements 4. The patient's functional ability including  ADL's, fall risks, home safety risks and hearing or visual impairment. 5. Diet and physical activity 6. Evidence for depression or mood disorders The patients weight, height, BMI have been recorded in the chart.  Hearing and vision has been addressed. I have made referrals, counseling and provided education to the patient based review of the above and I have provided the pt with a written personalized care plan for preventive services. See scanned questionairre. Advanced directives discussed: packet provided, discussed with pt and husband.  Reviewed preventative protocols and updated unless pt declined. To schedule upcoming mammogram in 2 months.    Intertrigo - Primary     Treat with clotrimazole today. Update if persistent, consider lotrisone.    Insomnia     Reviewed sleep  hygiene measures. Handout provided. Did not respond to melatonin or benadryl. Trial of trazodone - sent to pharmacy today.    Hypothyroid     Chronic, TSH stable.     HTN (hypertension)     Chronic, stable. Continue losartan.    HLD (hyperlipidemia)      Lab Results  Component Value Date   LDLCALC 140* 03/01/2014  elevated LDL. Goal LDL <100 given fmhx CAD. will need to discuss statin at f/u visit.    Depression     Stable off wellbutrin.        Follow up plan: No Follow-up on file.

## 2014-03-08 NOTE — Assessment & Plan Note (Signed)
Chronic, TSH stable.

## 2014-03-08 NOTE — Assessment & Plan Note (Signed)
Treat with clotrimazole today. Update if persistent, consider lotrisone.

## 2014-03-08 NOTE — Patient Instructions (Signed)
Handout provided on advanced directives today. For groin rash - treat with clotrimazole twice daily for 3-4 weeks, let me know if this is not effective. May try trazodone for sleep. Good to see you today, call us with questions Return as needed or in 1 year for next wellness exam. Insomnia Insomnia is frequent trouble falling and/or staying asleep. Insomnia can be a long term problem or a short term problem. Both are common. Insomnia can be a short term problem when the wakefulness is related to a certain stress or worry. Long term insomnia is often related to ongoing stress during waking hours and/or poor sleeping habits. Overtime, sleep deprivation itself can make the problem worse. Every little thing feels more severe because you are overtired and your ability to cope is decreased. CAUSES   Stress, anxiety, and depression.  Poor sleeping habits.  Distractions such as TV in the bedroom.  Naps close to bedtime.  Engaging in emotionally charged conversations before bed.  Technical reading before sleep.  Alcohol and other sedatives. They may make the problem worse. They can hurt normal sleep patterns and normal dream activity.  Stimulants such as caffeine for several hours prior to bedtime.  Pain syndromes and shortness of breath can cause insomnia.  Exercise late at night.  Changing time zones may cause sleeping problems (jet lag). It is sometimes helpful to have someone observe your sleeping patterns. They should look for periods of not breathing during the night (sleep apnea). They should also look to see how long those periods last. If you live alone or observers are uncertain, you can also be observed at a sleep clinic where your sleep patterns will be professionally monitored. Sleep apnea requires a checkup and treatment. Give your caregivers your medical history. Give your caregivers observations your family has made about your sleep.  SYMPTOMS   Not feeling rested in the  morning.  Anxiety and restlessness at bedtime.  Difficulty falling and staying asleep. TREATMENT   Your caregiver may prescribe treatment for an underlying medical disorders. Your caregiver can give advice or help if you are using alcohol or other drugs for self-medication. Treatment of underlying problems will usually eliminate insomnia problems.  Medications can be prescribed for short time use. They are generally not recommended for lengthy use.  Over-the-counter sleep medicines are not recommended for lengthy use. They can be habit forming.  You can promote easier sleeping by making lifestyle changes such as:  Using relaxation techniques that help with breathing and reduce muscle tension.  Exercising earlier in the day.  Changing your diet and the time of your last meal. No night time snacks.  Establish a regular time to go to bed.  Counseling can help with stressful problems and worry.  Soothing music and white noise may be helpful if there are background noises you cannot remove.  Stop tedious detailed work at least one hour before bedtime. HOME CARE INSTRUCTIONS   Keep a diary. Inform your caregiver about your progress. This includes any medication side effects. See your caregiver regularly. Take note of:  Times when you are asleep.  Times when you are awake during the night.  The quality of your sleep.  How you feel the next day. This information will help your caregiver care for you.  Get out of bed if you are still awake after 15 minutes. Read or do some quiet activity. Keep the lights down. Wait until you feel sleepy and go back to bed.  Keep regular sleeping and  waking hours. Avoid naps.  Exercise regularly.  Avoid distractions at bedtime. Distractions include watching television or engaging in any intense or detailed activity like attempting to balance the household checkbook.  Develop a bedtime ritual. Keep a familiar routine of bathing, brushing your  teeth, climbing into bed at the same time each night, listening to soothing music. Routines increase the success of falling to sleep faster.  Use relaxation techniques. This can be using breathing and muscle tension release routines. It can also include visualizing peaceful scenes. You can also help control troubling or intruding thoughts by keeping your mind occupied with boring or repetitive thoughts like the old concept of counting sheep. You can make it more creative like imagining planting one beautiful flower after another in your backyard garden.  During your day, work to eliminate stress. When this is not possible use some of the previous suggestions to help reduce the anxiety that accompanies stressful situations. MAKE SURE YOU:   Understand these instructions.  Will watch your condition.  Will get help right away if you are not doing well or get worse. Document Released: 08/23/2000 Document Revised: 11/18/2011 Document Reviewed: 09/23/2007 [facility] Patient Information 2015 [place], [place]. This information is not intended to replace advice given to you by your health care Brittany [name]. Make sure you discuss any questions you have with your health care Brittany [name].

## 2014-03-08 NOTE — Assessment & Plan Note (Signed)
I have personally reviewed the Medicare Annual Wellness questionnaire and have noted 1. The patient's medical and social history 2. Their use of alcohol, tobacco or illicit drugs 3. Their current medications and supplements 4. The patient's functional ability including ADL's, fall risks, home safety risks and hearing or visual impairment. 5. Diet and physical activity 6. Evidence for depression or mood disorders The patients weight, height, BMI have been recorded in the chart.  Hearing and vision has been addressed. I have made referrals, counseling and provided education to the patient based review of the above and I have provided the pt with a written personalized care plan for preventive services. See scanned questionairre. Advanced directives discussed: packet provided, discussed with pt and husband.  Reviewed preventative protocols and updated unless pt declined. To schedule upcoming mammogram in 2 months.

## 2014-03-08 NOTE — Assessment & Plan Note (Signed)
Lab Results  Component Value Date   LDLCALC 140* 03/01/2014  elevated LDL. Goal LDL <100 given fmhx CAD. will need to discuss statin at f/u visit.

## 2014-03-08 NOTE — Assessment & Plan Note (Signed)
Chronic, stable. Continue losartan.  

## 2014-03-08 NOTE — Assessment & Plan Note (Signed)
Reviewed sleep hygiene measures. Handout provided. Did not respond to melatonin or benadryl. Trial of trazodone - sent to pharmacy today.

## 2014-03-08 NOTE — Assessment & Plan Note (Signed)
Stable off wellbutrin.

## 2014-03-22 ENCOUNTER — Ambulatory Visit (INDEPENDENT_AMBULATORY_CARE_PROVIDER_SITE_OTHER): Payer: BC Managed Care – PPO

## 2014-03-22 ENCOUNTER — Ambulatory Visit (INDEPENDENT_AMBULATORY_CARE_PROVIDER_SITE_OTHER): Payer: Medicare Other

## 2014-03-22 VITALS — BP 111/63 | HR 76 | Resp 16

## 2014-03-22 DIAGNOSIS — M2010 Hallux valgus (acquired), unspecified foot: Secondary | ICD-10-CM

## 2014-03-22 DIAGNOSIS — M2012 Hallux valgus (acquired), left foot: Secondary | ICD-10-CM

## 2014-03-22 DIAGNOSIS — Z09 Encounter for follow-up examination after completed treatment for conditions other than malignant neoplasm: Secondary | ICD-10-CM

## 2014-03-22 DIAGNOSIS — Z9889 Other specified postprocedural states: Secondary | ICD-10-CM

## 2014-03-22 DIAGNOSIS — M2020 Hallux rigidus, unspecified foot: Secondary | ICD-10-CM

## 2014-03-22 DIAGNOSIS — M2022 Hallux rigidus, left foot: Secondary | ICD-10-CM

## 2014-03-22 NOTE — Progress Notes (Signed)
   Subjective:    Patient ID: Brittany [name], female    DOB: Jul 20, 1956, 58 y.o.   MRN: [id number]  HPI  Pt is here for routine post op first MTP  Fusion left foot. Stated that pain is tolerable but still has noted swelling. Was using neosporin and gauze and formed blisters on incision site, stopped and blisters have improved. DOS 01/10/14  Review of Systems no new systemic changes or findings other than possible allergies Neosporin     Objective:   Physical Exam Neurovascular status appears intact patient apparently is allergic to Neosporin devices using plain cocoa butter or he had lotion to the incision area the blisters have resolved there is no open wound ulceration no discharge or drainage although the blisters had drainage apparently patient has good position of the hallux x-rays reveal good consolidation of the osteotomy no displacements not although may not be completely healed still has mild postoperative edema patient may continue swelling advised her to 3-6 months following procedures however has only been 2 months since her surgery good clinical and radiographic alignment is noted     Assessment & Plan:  Assessment good postop progress still some slight tenderness and time the incision area and mild postoperative edema noted may discontinue air fracture boot and crutches return to walking tennis or athletic shoe applied her dance toe shoe or crocs are okay no barefoot no flip-flops or flimsy shoes. Return in 2 months for long-term postop followup. Notable listed or running activities normal walking on level surfaces recommended recheck in 2 months as instructed  Harriet Masson DPM

## 2014-03-22 NOTE — Patient Instructions (Signed)

## 2014-04-19 DIAGNOSIS — M545 Low back pain, unspecified: Secondary | ICD-10-CM | POA: Diagnosis not present

## 2014-05-08 ENCOUNTER — Other Ambulatory Visit: Payer: Self-pay | Admitting: Family Medicine

## 2014-06-01 ENCOUNTER — Other Ambulatory Visit: Payer: Self-pay

## 2014-06-01 DIAGNOSIS — Z1231 Encounter for screening mammogram for malignant neoplasm of breast: Secondary | ICD-10-CM

## 2014-06-10 ENCOUNTER — Telehealth: Payer: Self-pay | Admitting: Obstetrics

## 2014-06-14 ENCOUNTER — Ambulatory Visit
Admission: RE | Admit: 2014-06-14 | Discharge: 2014-06-14 | Disposition: A | Discharge status: Home / Self Care | Payer: BC Managed Care – PPO | Source: Ambulatory Visit

## 2014-06-14 ENCOUNTER — Encounter: Payer: Self-pay | Admitting: *Deleted

## 2014-06-14 DIAGNOSIS — Z1231 Encounter for screening mammogram for malignant neoplasm of breast: Secondary | ICD-10-CM | POA: Diagnosis not present

## 2014-06-14 LAB — HM MAMMOGRAPHY: HM MAMMO: NORMAL

## 2014-06-14 NOTE — Telephone Encounter (Signed)
10.06.2015 - Patient returned call. [place] NP appt with Dr. Delsa Sale for 12.14.2015. brm

## 2014-06-21 ENCOUNTER — Ambulatory Visit (INDEPENDENT_AMBULATORY_CARE_PROVIDER_SITE_OTHER): Payer: Medicare Other

## 2014-06-21 ENCOUNTER — Other Ambulatory Visit: Payer: Self-pay

## 2014-06-21 VITALS — BP 133/66 | HR 81 | Resp 13

## 2014-06-21 DIAGNOSIS — M19072 Primary osteoarthritis, left ankle and foot: Secondary | ICD-10-CM

## 2014-06-21 DIAGNOSIS — Z9889 Other specified postprocedural states: Secondary | ICD-10-CM

## 2014-06-21 DIAGNOSIS — M2022 Hallux rigidus, left foot: Secondary | ICD-10-CM

## 2014-06-21 DIAGNOSIS — M2012 Hallux valgus (acquired), left foot: Secondary | ICD-10-CM | POA: Diagnosis not present

## 2014-06-21 DIAGNOSIS — M778 Other enthesopathies, not elsewhere classified: Secondary | ICD-10-CM

## 2014-06-21 DIAGNOSIS — M779 Enthesopathy, unspecified: Secondary | ICD-10-CM

## 2014-06-21 MED ORDER — MELOXICAM 15 MG PO TABS: 15.0000 mg | ORAL_TABLET | Freq: Every day | ORAL | Status: DC

## 2014-06-21 MED ORDER — OMEPRAZOLE 40 MG PO CPDR: 40.0000 mg | DELAYED_RELEASE_CAPSULE | Freq: Every day | ORAL | Status: DC

## 2014-06-21 NOTE — Telephone Encounter (Signed)
[facility]; Morey Hummingbird advised pt refill sent to pharmacy.

## 2014-06-21 NOTE — Patient Instructions (Addendum)

## 2014-06-21 NOTE — Progress Notes (Signed)
   Subjective:    Patient ID: Brittany [name], female    DOB: [birth year], 58 y.o.   MRN: [id number]  HPI Comments: DOS 01/10/2014.  Pt states she still having pain and worses in the rainy periods.  Pt states she has a sandpaper-sensation between her toes.     Review of Systems no new findings or systemic changes noted     Objective:   Physical Exam Lower extremity objective findings reveal vascular status to be intact pedal pulses are palpable epicritic and proprioceptive sensations intact having some pain over the dorsal foot is exacerbated by wet in cold weather most likely arthropathy or capsulitis at the Lisfranc joint mid tarsus area of the left foot also some tenderness the toe area the hallux is rectus and fusion site is well coapted and healed x-rays reveal good consolidation of the osteotomy nondisplaced screw fixation still noted. The lesser digits show some mild digital contracture the medial deviation of the second digit some slight hammering of the toes identified. No open wounds or ulcers no secondary infections there is definite tenderness on inversion eversion Lisfranc joint left more so than right x-rays confirm mild arthropathy and asymmetric joint space narrowing of Lisfranc's particular lateral column area no other osseous abnormalities noted successful fusion of great toe joint left       Assessment & Plan:  Assessment good postop progress there is some residual asked arthropathy and capsulitis Lisfranc joint which is advised to likely have arthritis in multiple joints including her foot and ankle which may flare up with weather changes. This time prescription for [facility] is given to take on an as-needed basis for flareups arthritis followup in the future and as-needed basis consider 6 month followup  Harriet Masson DPM

## 2014-06-22 ENCOUNTER — Telehealth: Payer: Self-pay | Admitting: *Deleted

## 2014-06-22 DIAGNOSIS — E8881 Metabolic syndrome: Secondary | ICD-10-CM | POA: Diagnosis not present

## 2014-06-22 DIAGNOSIS — E039 Hypothyroidism, unspecified: Secondary | ICD-10-CM | POA: Diagnosis not present

## 2014-06-22 DIAGNOSIS — Z6832 Body mass index (BMI) 32.0-32.9, adult: Secondary | ICD-10-CM | POA: Diagnosis not present

## 2014-06-22 DIAGNOSIS — R635 Abnormal weight gain: Secondary | ICD-10-CM | POA: Diagnosis not present

## 2014-06-22 DIAGNOSIS — N951 Menopausal and female climacteric states: Secondary | ICD-10-CM | POA: Diagnosis not present

## 2014-06-22 MED ORDER — OMEPRAZOLE 40 MG PO CPDR: 40.0000 mg | DELAYED_RELEASE_CAPSULE | Freq: Every day | ORAL | Status: DC

## 2014-06-22 NOTE — Telephone Encounter (Signed)
Rx called in as directed.   

## 2014-06-22 NOTE — Addendum Note (Signed)
Addended by: Royann Shivers A on: 06/22/2014 12:55 PM   Modules accepted: Orders

## 2014-06-22 NOTE — Addendum Note (Signed)
Addended by: Royann Shivers A on: 06/22/2014 12:59 PM   Modules accepted: Orders

## 2014-06-22 NOTE — Telephone Encounter (Signed)
EScript error -plz phone in.

## 2014-06-23 ENCOUNTER — Other Ambulatory Visit: Payer: Self-pay | Admitting: *Deleted

## 2014-06-23 NOTE — Telephone Encounter (Signed)
Patient was given a written prescription for Mobic 15 mg, one by mouth a day, quantity 30, by Marcy Siren so she could get prescription immediately.  Other prescription was sent to [facility].

## 2014-06-27 DIAGNOSIS — E538 Deficiency of other specified B group vitamins: Secondary | ICD-10-CM | POA: Diagnosis not present

## 2014-06-27 DIAGNOSIS — E8881 Metabolic syndrome: Secondary | ICD-10-CM | POA: Diagnosis not present

## 2014-06-27 DIAGNOSIS — E039 Hypothyroidism, unspecified: Secondary | ICD-10-CM | POA: Diagnosis not present

## 2014-06-27 DIAGNOSIS — Z6832 Body mass index (BMI) 32.0-32.9, adult: Secondary | ICD-10-CM | POA: Diagnosis not present

## 2014-06-27 DIAGNOSIS — N958 Other specified menopausal and perimenopausal disorders: Secondary | ICD-10-CM | POA: Diagnosis not present

## 2014-07-01 DIAGNOSIS — Z23 Encounter for immunization: Secondary | ICD-10-CM | POA: Diagnosis not present

## 2014-07-08 DIAGNOSIS — Z6832 Body mass index (BMI) 32.0-32.9, adult: Secondary | ICD-10-CM | POA: Diagnosis not present

## 2014-07-08 DIAGNOSIS — E538 Deficiency of other specified B group vitamins: Secondary | ICD-10-CM | POA: Diagnosis not present

## 2014-07-08 DIAGNOSIS — E8881 Metabolic syndrome: Secondary | ICD-10-CM | POA: Diagnosis not present

## 2014-07-08 DIAGNOSIS — E039 Hypothyroidism, unspecified: Secondary | ICD-10-CM | POA: Diagnosis not present

## 2014-07-18 DIAGNOSIS — Z6832 Body mass index (BMI) 32.0-32.9, adult: Secondary | ICD-10-CM | POA: Diagnosis not present

## 2014-07-18 DIAGNOSIS — E538 Deficiency of other specified B group vitamins: Secondary | ICD-10-CM | POA: Diagnosis not present

## 2014-07-18 DIAGNOSIS — E8881 Metabolic syndrome: Secondary | ICD-10-CM | POA: Diagnosis not present

## 2014-07-18 DIAGNOSIS — E039 Hypothyroidism, unspecified: Secondary | ICD-10-CM | POA: Diagnosis not present

## 2014-08-02 DIAGNOSIS — N958 Other specified menopausal and perimenopausal disorders: Secondary | ICD-10-CM | POA: Diagnosis not present

## 2014-08-02 DIAGNOSIS — E538 Deficiency of other specified B group vitamins: Secondary | ICD-10-CM | POA: Diagnosis not present

## 2014-08-02 DIAGNOSIS — E8881 Metabolic syndrome: Secondary | ICD-10-CM | POA: Diagnosis not present

## 2014-08-02 DIAGNOSIS — N951 Menopausal and female climacteric states: Secondary | ICD-10-CM | POA: Diagnosis not present

## 2014-08-02 DIAGNOSIS — E039 Hypothyroidism, unspecified: Secondary | ICD-10-CM | POA: Diagnosis not present

## 2014-08-02 DIAGNOSIS — Z6832 Body mass index (BMI) 32.0-32.9, adult: Secondary | ICD-10-CM | POA: Diagnosis not present

## 2014-08-16 DIAGNOSIS — E039 Hypothyroidism, unspecified: Secondary | ICD-10-CM | POA: Diagnosis not present

## 2014-08-16 DIAGNOSIS — N958 Other specified menopausal and perimenopausal disorders: Secondary | ICD-10-CM | POA: Diagnosis not present

## 2014-08-16 DIAGNOSIS — E8881 Metabolic syndrome: Secondary | ICD-10-CM | POA: Diagnosis not present

## 2014-08-16 DIAGNOSIS — Z6832 Body mass index (BMI) 32.0-32.9, adult: Secondary | ICD-10-CM | POA: Diagnosis not present

## 2014-08-16 DIAGNOSIS — E538 Deficiency of other specified B group vitamins: Secondary | ICD-10-CM | POA: Diagnosis not present

## 2014-08-22 ENCOUNTER — Ambulatory Visit: Payer: Medicare Other | Admitting: Obstetrics & Gynecology

## 2014-08-30 DIAGNOSIS — N958 Other specified menopausal and perimenopausal disorders: Secondary | ICD-10-CM | POA: Diagnosis not present

## 2014-08-30 DIAGNOSIS — E538 Deficiency of other specified B group vitamins: Secondary | ICD-10-CM | POA: Diagnosis not present

## 2014-08-30 DIAGNOSIS — Z6832 Body mass index (BMI) 32.0-32.9, adult: Secondary | ICD-10-CM | POA: Diagnosis not present

## 2014-08-30 DIAGNOSIS — E8881 Metabolic syndrome: Secondary | ICD-10-CM | POA: Diagnosis not present

## 2014-08-30 DIAGNOSIS — E039 Hypothyroidism, unspecified: Secondary | ICD-10-CM | POA: Diagnosis not present

## 2014-09-06 ENCOUNTER — Encounter: Payer: Self-pay | Admitting: Obstetrics & Gynecology

## 2014-09-13 DIAGNOSIS — M5136 Other intervertebral disc degeneration, lumbar region: Secondary | ICD-10-CM | POA: Diagnosis not present

## 2014-09-15 DIAGNOSIS — N958 Other specified menopausal and perimenopausal disorders: Secondary | ICD-10-CM | POA: Diagnosis not present

## 2014-09-15 DIAGNOSIS — E538 Deficiency of other specified B group vitamins: Secondary | ICD-10-CM | POA: Diagnosis not present

## 2014-09-15 DIAGNOSIS — Z6832 Body mass index (BMI) 32.0-32.9, adult: Secondary | ICD-10-CM | POA: Diagnosis not present

## 2014-09-15 DIAGNOSIS — E039 Hypothyroidism, unspecified: Secondary | ICD-10-CM | POA: Diagnosis not present

## 2014-09-15 DIAGNOSIS — E8881 Metabolic syndrome: Secondary | ICD-10-CM | POA: Diagnosis not present

## 2014-09-26 ENCOUNTER — Encounter: Payer: Self-pay | Admitting: Family Medicine

## 2014-10-11 ENCOUNTER — Other Ambulatory Visit: Payer: Self-pay

## 2014-10-17 ENCOUNTER — Other Ambulatory Visit: Payer: Self-pay | Admitting: Family Medicine

## 2014-10-17 DIAGNOSIS — L304 Erythema intertrigo: Secondary | ICD-10-CM | POA: Diagnosis not present

## 2014-10-17 DIAGNOSIS — L301 Dyshidrosis [pompholyx]: Secondary | ICD-10-CM | POA: Diagnosis not present

## 2014-10-17 DIAGNOSIS — L700 Acne vulgaris: Secondary | ICD-10-CM | POA: Diagnosis not present

## 2014-10-17 DIAGNOSIS — L821 Other seborrheic keratosis: Secondary | ICD-10-CM | POA: Diagnosis not present

## 2014-10-17 DIAGNOSIS — L298 Other pruritus: Secondary | ICD-10-CM | POA: Diagnosis not present

## 2014-10-17 DIAGNOSIS — L738 Other specified follicular disorders: Secondary | ICD-10-CM | POA: Diagnosis not present

## 2014-11-07 DIAGNOSIS — L304 Erythema intertrigo: Secondary | ICD-10-CM | POA: Diagnosis not present

## 2014-11-07 DIAGNOSIS — L301 Dyshidrosis [pompholyx]: Secondary | ICD-10-CM | POA: Diagnosis not present

## 2014-11-07 DIAGNOSIS — L700 Acne vulgaris: Secondary | ICD-10-CM | POA: Diagnosis not present

## 2014-11-21 DIAGNOSIS — E538 Deficiency of other specified B group vitamins: Secondary | ICD-10-CM | POA: Diagnosis not present

## 2014-11-21 DIAGNOSIS — E8881 Metabolic syndrome: Secondary | ICD-10-CM | POA: Diagnosis not present

## 2014-11-21 DIAGNOSIS — E039 Hypothyroidism, unspecified: Secondary | ICD-10-CM | POA: Diagnosis not present

## 2014-11-21 DIAGNOSIS — Z6832 Body mass index (BMI) 32.0-32.9, adult: Secondary | ICD-10-CM | POA: Diagnosis not present

## 2014-11-25 ENCOUNTER — Other Ambulatory Visit: Payer: Self-pay | Admitting: Family Medicine

## 2014-11-29 DIAGNOSIS — Z6832 Body mass index (BMI) 32.0-32.9, adult: Secondary | ICD-10-CM | POA: Diagnosis not present

## 2014-11-29 DIAGNOSIS — E538 Deficiency of other specified B group vitamins: Secondary | ICD-10-CM | POA: Diagnosis not present

## 2014-11-29 DIAGNOSIS — N958 Other specified menopausal and perimenopausal disorders: Secondary | ICD-10-CM | POA: Diagnosis not present

## 2014-11-29 DIAGNOSIS — E039 Hypothyroidism, unspecified: Secondary | ICD-10-CM | POA: Diagnosis not present

## 2014-11-29 DIAGNOSIS — E8881 Metabolic syndrome: Secondary | ICD-10-CM | POA: Diagnosis not present

## 2014-11-29 LAB — COMPREHENSIVE METABOLIC PANEL
ALT: 8
AST: 14 U/L
Alkaline Phosphatase: 68 U/L
BILIRUBIN: 0.6
CREATININE: 0.99
Glucose: 98
Potassium: 4.3 mmol/L
Sodium: 138

## 2014-11-29 LAB — CBC
HGB: 13.7 g/dL
WBC: 5.5
platelet count: 251

## 2014-11-29 LAB — TSH: TSH: 1.92

## 2014-12-19 DIAGNOSIS — E039 Hypothyroidism, unspecified: Secondary | ICD-10-CM | POA: Diagnosis not present

## 2014-12-19 DIAGNOSIS — E8881 Metabolic syndrome: Secondary | ICD-10-CM | POA: Diagnosis not present

## 2014-12-19 DIAGNOSIS — Z6832 Body mass index (BMI) 32.0-32.9, adult: Secondary | ICD-10-CM | POA: Diagnosis not present

## 2014-12-19 DIAGNOSIS — N958 Other specified menopausal and perimenopausal disorders: Secondary | ICD-10-CM | POA: Diagnosis not present

## 2014-12-19 DIAGNOSIS — E538 Deficiency of other specified B group vitamins: Secondary | ICD-10-CM | POA: Diagnosis not present

## 2015-01-05 ENCOUNTER — Encounter: Payer: Self-pay | Admitting: *Deleted

## 2015-01-07 ENCOUNTER — Other Ambulatory Visit: Payer: Self-pay

## 2015-02-07 ENCOUNTER — Other Ambulatory Visit: Payer: Self-pay | Admitting: Family Medicine

## 2015-02-23 ENCOUNTER — Other Ambulatory Visit: Payer: Self-pay | Admitting: Family Medicine

## 2015-05-08 ENCOUNTER — Other Ambulatory Visit: Payer: Self-pay | Admitting: Family Medicine

## 2015-05-11 ENCOUNTER — Other Ambulatory Visit: Payer: Self-pay | Admitting: Family Medicine

## 2015-06-08 DIAGNOSIS — Z23 Encounter for immunization: Secondary | ICD-10-CM | POA: Diagnosis not present

## 2015-08-01 ENCOUNTER — Other Ambulatory Visit: Payer: Self-pay

## 2015-08-01 DIAGNOSIS — Z1231 Encounter for screening mammogram for malignant neoplasm of breast: Secondary | ICD-10-CM

## 2015-08-05 ENCOUNTER — Other Ambulatory Visit: Payer: Self-pay | Admitting: Family Medicine

## 2015-08-07 ENCOUNTER — Other Ambulatory Visit: Payer: Self-pay | Admitting: Family Medicine

## 2015-08-22 ENCOUNTER — Other Ambulatory Visit: Payer: Self-pay | Admitting: Family Medicine

## 2015-09-06 ENCOUNTER — Ambulatory Visit
Admission: RE | Admit: 2015-09-06 | Discharge: 2015-09-06 | Disposition: A | Discharge status: Home / Self Care | Payer: Medicare Other | Source: Ambulatory Visit

## 2015-09-06 DIAGNOSIS — Z1231 Encounter for screening mammogram for malignant neoplasm of breast: Secondary | ICD-10-CM | POA: Diagnosis not present

## 2015-09-08 ENCOUNTER — Encounter: Payer: Self-pay | Admitting: *Deleted

## 2015-09-18 ENCOUNTER — Other Ambulatory Visit: Payer: Self-pay

## 2015-09-18 MED ORDER — OMEPRAZOLE 40 MG PO CPDR: 40.0000 mg | DELAYED_RELEASE_CAPSULE | Freq: Every day | ORAL | Status: DC

## 2015-09-18 NOTE — Telephone Encounter (Signed)
Pt no longer gets from mail order request # [id number] omeprazole sent to [facility] [place] until CPX on 09/29/15. Done.

## 2015-09-20 ENCOUNTER — Ambulatory Visit (INDEPENDENT_AMBULATORY_CARE_PROVIDER_SITE_OTHER): Payer: Medicare Other | Admitting: Primary Care

## 2015-09-20 ENCOUNTER — Encounter: Payer: Self-pay | Admitting: Primary Care

## 2015-09-20 VITALS — BP 130/76 | HR 70 | Temp 98.7°F | Ht 64.0 in | Wt 176.0 lb

## 2015-09-20 DIAGNOSIS — R05 Cough: Secondary | ICD-10-CM

## 2015-09-20 DIAGNOSIS — R059 Cough, unspecified: Secondary | ICD-10-CM

## 2015-09-20 MED ORDER — HYDROCODONE-HOMATROPINE 5-1.5 MG/5ML PO SYRP: 5.0000 mL | ORAL_SOLUTION | Freq: Every evening | ORAL | Status: DC | PRN

## 2015-09-20 MED ORDER — AZITHROMYCIN 250 MG PO TABS: ORAL_TABLET | ORAL | Status: DC

## 2015-09-20 NOTE — Progress Notes (Signed)
Pre visit review using our clinic review tool, if applicable. No additional management support is needed unless otherwise documented below in the visit note. 

## 2015-09-20 NOTE — Patient Instructions (Signed)
Start Azithromycin antibiotics. Take 2 tablets by mouth today, then 1 tablet daily for 4 additional days.  You may take the Hycodan cough suppressant at bedtime as needed for cough and rest. Caution this medication contains codeine and will make you feel drowsy.  Try taking Robitussin DM for cough and congestion.  Increase consumption of fluids and rest. Please call me if no improvement in 3-4 days.  It was a pleasure to see you today!

## 2015-09-20 NOTE — Progress Notes (Signed)
Subjective:    Patient ID: Brittany [name], female    DOB: 1956/08/19, 60 y.o.   MRN: [id number]  HPI  Brittany [name] is a 60 year old female who presents today with a chief complaint of cough. She also reports nasal congestion, fevers, chills, ear pain. Her fever reached 101 last night. Her cough is non productive. Her symptoms have been present for about 7-8 days. She's been taking vitamin C, zinc, flonase, and mucinex, tylenol, and afrin nasal spray with temporary improvement. Her nephew was recently ill with the same symptoms several weeks ago, and has recently been visiting her ill husband in the hospital. Overall she's feeling worse.     Review of Systems  Constitutional: Positive for fever and chills.  HENT: Positive for congestion, sinus pressure and sore throat.   Respiratory: Positive for cough and shortness of breath. Negative for wheezing.   Cardiovascular: Negative for chest pain.       Past Medical History  Diagnosis Date  . Arthritis     neck pain and numbness left digits Vertell Limber)  . Depression     failed paxil, lexapro, cymbalta, zoloft, pristiq  . GERD (gastroesophageal reflux disease)   . Seasonal allergies   . HTN (hypertension)   . Migraines     occasional, stress related  . OSA (obstructive sleep apnea)     mild, no CPAP  . Hypothyroid   . Colon polyps     last colonoscopy 2006? rpt due  . History of diverticulitis of colon     s/p resection of large intestine, rpt itis 02/2012  . Postmenopausal     HRT compound, previously premarin  . Fatty liver 02/2012    on CT scan 02/2012  . Diverticulitis   . Vertebral compression fracture (HCC) 07/2012    L1 25% Vertell Limber) released without intervention  . Chronic lower back pain     persistent after L1 cmp fx, with multilevel mild lumbar DDD - good resolution after L L5/S1 ESI ([facility])    Social History   Social History  . Marital Status: Married    Spouse Name: N/A  . Number of Children: 2  . Years of Education:  HS   Occupational History  .     Social History Main Topics  . Smoking status: Never Smoker   . Smokeless tobacco: Never Used  . Alcohol Use: Yes     Comment: rare  . Drug Use: No  . Sexual Activity: Not on file   Other Topics Concern  . Not on file   Social History Narrative   Caffeine: 2-3 cups/day (1-2 coffee)   Lives with husband and 13yo son, 4 dogs   Activity: no scheduled   Diet: lots of water, fruits/vegetable    Past Surgical History  Procedure Laterality Date  . Tonsillectomy  1964  . Anterior fusion cervical spine  01/1999    cervical HNP  . Knee surgery  1995, 2011    torn menisci (MRI 2010 L)  . Total abdominal hysterectomy w/ bilateral salpingoophorectomy  06/2009    vag bleeding  . Colon resection  2008    2 ft removed, diverticulitis  . US echocardiography  08/2010    normal, EF 63%, mild valvular issues  . Spirometry  2011    normal  . Abdominal hysterectomy    . Colonoscopy  03/2012    partial colectomy, scattered diverticulae [name], rpt 10 yrs  . Esophagogastroduodenoscopy  03/2012    mod gastritis, benign polyps,  H pylori neg  . Esi Left 06/2013, 12/2013, 09/2014    L5/S1 with good resolution of pain  . Foot surgery Left 01/2014    big toe fusion    Family History  Problem Relation Age of Onset  . Coronary artery disease Mother 98    CABG and valve replacement  . Arthritis Mother   . Valvular heart disease Mother   . Coronary artery disease Father 37    CAD/MI  . Hypertension Father   . Heart disease Father   . Diabetes Sister   . Hypertension Sister   . Hypertension Brother   . Cancer Neg Hx     Allergies  Allergen Reactions  . Cymbalta [Duloxetine Hcl] Other (See Comments)    suicidality  . Percocet [Oxycodone-Acetaminophen] Other (See Comments)    Nauseated, dizzy    Current Outpatient Prescriptions on File Prior to Visit  Medication Sig Dispense Refill  . fish oil-omega-3 fatty acids 1000 MG capsule Take 1 g by mouth  daily.    . fluticasone (FLONASE) 50 MCG/ACT nasal spray Place 2 sprays into both nostrils 2 (two) times daily. In each nostril 16 g 1  . ibuprofen (ADVIL,MOTRIN) 200 MG tablet Take 600 mg by mouth as needed.     Marland Kitchen levothyroxine (SYNTHROID, LEVOTHROID) 112 MCG tablet TAKE 1 TABLET DAILY (MUST HAVE PHYSICAL FOR FURTHER REFILLS) 90 tablet 0  . losartan (COZAAR) 50 MG tablet TAKE 1 TABLET DAILY (MUST HAVE PHYSICAL FOR FURTHER REFILLS) 90 tablet 0  . Multiple Minerals-Vitamins (CITRACAL PLUS PO) Take 1 tablet by mouth 2 (two) times daily. Total of 1600mg  calcium and 2000 IU vit D    . Multiple Vitamins-Minerals (ALIVE WOMENS ENERGY PO) Take 1 tablet by mouth daily.      Marland Kitchen omeprazole (PRILOSEC) 40 MG capsule Take 1 capsule (40 mg total) by mouth daily. 90 capsule 1   No current facility-administered medications on file prior to visit.    BP 130/76 mmHg  Pulse 70  Temp(Src) 98.7 F (37.1 C) (Oral)  Ht 5\' 4"  (1.626 m)  Wt 176 lb (79.833 kg)  BMI 30.20 kg/m2  SpO2 98%    Objective:   Physical Exam  Constitutional: She appears well-nourished.  HENT:  Right Ear: Tympanic membrane and ear canal normal.  Left Ear: Tympanic membrane and ear canal normal.  Nose: Right sinus exhibits no maxillary sinus tenderness and no frontal sinus tenderness. Left sinus exhibits no maxillary sinus tenderness and no frontal sinus tenderness.  Mouth/Throat: Oropharynx is clear and moist.  Eyes: Conjunctivae are normal.  Neck: Neck supple.  Cardiovascular: Normal rate and regular rhythm.   Pulmonary/Chest: Effort normal and breath sounds normal.  Lymphadenopathy:    She has cervical adenopathy.  Skin: Skin is warm and dry.          Assessment & Plan:  URI:  Cough, congestion, sinus pressure, fevers x 8 days. Overall worse with temporary improvement with OTC's. Exam mostly unremarkable as this has not yet reached to lower respiratory system. Due to presentation, recent hospital exposure, and  duration of symptoms, will treat with antibiotics. Zpak and Hycodan RX's provided today. Robitussin DM for day cough. Fluids, rest. Return precautions provided.

## 2015-09-25 ENCOUNTER — Other Ambulatory Visit: Payer: Self-pay | Admitting: Family Medicine

## 2015-09-25 ENCOUNTER — Other Ambulatory Visit (INDEPENDENT_AMBULATORY_CARE_PROVIDER_SITE_OTHER): Payer: Medicare Other

## 2015-09-25 DIAGNOSIS — E785 Hyperlipidemia, unspecified: Secondary | ICD-10-CM

## 2015-09-25 DIAGNOSIS — E039 Hypothyroidism, unspecified: Secondary | ICD-10-CM | POA: Diagnosis not present

## 2015-09-25 DIAGNOSIS — I10 Essential (primary) hypertension: Secondary | ICD-10-CM | POA: Diagnosis not present

## 2015-09-25 DIAGNOSIS — E669 Obesity, unspecified: Secondary | ICD-10-CM

## 2015-09-25 LAB — BASIC METABOLIC PANEL
BUN: 16 mg/dL (ref 6–23)
CALCIUM: 8.6 mg/dL (ref 8.4–10.5)
CO2: 24 mEq/L (ref 19–32)
CREATININE: 0.94 mg/dL (ref 0.40–1.20)
Chloride: 104 mEq/L (ref 96–112)
GFR: 64.44 mL/min (ref >60.00)
Glucose, Bld: 82 mg/dL (ref 70–99)
Potassium: 4.3 mEq/L (ref 3.5–5.1)
SODIUM: 141 meq/L (ref 135–145)

## 2015-09-25 LAB — LIPID PANEL
CHOL/HDL RATIO: 4
Cholesterol: 192 mg/dL (ref 0–200)
HDL: 45.2 mg/dL (ref >39.00)
LDL CALC: 127 mg/dL — AB (ref 0–99)
NonHDL: 147.23
Triglycerides: 102 mg/dL (ref 0.0–149.0)
VLDL: 20.4 mg/dL (ref 0.0–40.0)

## 2015-09-25 LAB — TSH: TSH: 1.91 u[IU]/mL (ref 0.35–4.50)

## 2015-09-29 ENCOUNTER — Ambulatory Visit (INDEPENDENT_AMBULATORY_CARE_PROVIDER_SITE_OTHER): Payer: Medicare Other | Admitting: Family Medicine

## 2015-09-29 ENCOUNTER — Encounter: Payer: Self-pay | Admitting: Family Medicine

## 2015-09-29 VITALS — BP 130/70 | HR 88 | Temp 98.3°F | Wt 177.2 lb

## 2015-09-29 DIAGNOSIS — K219 Gastro-esophageal reflux disease without esophagitis: Secondary | ICD-10-CM | POA: Diagnosis not present

## 2015-09-29 DIAGNOSIS — E785 Hyperlipidemia, unspecified: Secondary | ICD-10-CM | POA: Diagnosis not present

## 2015-09-29 DIAGNOSIS — Z0000 Encounter for general adult medical examination without abnormal findings: Secondary | ICD-10-CM

## 2015-09-29 DIAGNOSIS — Z7189 Other specified counseling: Secondary | ICD-10-CM | POA: Insufficient documentation

## 2015-09-29 DIAGNOSIS — E669 Obesity, unspecified: Secondary | ICD-10-CM

## 2015-09-29 DIAGNOSIS — I10 Essential (primary) hypertension: Secondary | ICD-10-CM

## 2015-09-29 DIAGNOSIS — F329 Major depressive disorder, single episode, unspecified: Secondary | ICD-10-CM

## 2015-09-29 DIAGNOSIS — E039 Hypothyroidism, unspecified: Secondary | ICD-10-CM

## 2015-09-29 DIAGNOSIS — J019 Acute sinusitis, unspecified: Secondary | ICD-10-CM | POA: Diagnosis not present

## 2015-09-29 DIAGNOSIS — J0191 Acute recurrent sinusitis, unspecified: Secondary | ICD-10-CM | POA: Insufficient documentation

## 2015-09-29 DIAGNOSIS — F32A Depression, unspecified: Secondary | ICD-10-CM

## 2015-09-29 LAB — MICROALBUMIN / CREATININE URINE RATIO
CREATININE, U: 149 mg/dL
Microalb Creat Ratio: 0.5 mg/g (ref 0.0–30.0)
Microalb, Ur: <0.7 mg/dL (ref 0.0–1.9)

## 2015-09-29 MED ORDER — LOSARTAN POTASSIUM 50 MG PO TABS: 50.0000 mg | ORAL_TABLET | Freq: Every day | ORAL | Status: DC

## 2015-09-29 MED ORDER — LEVOTHYROXINE SODIUM 112 MCG PO TABS: ORAL_TABLET | ORAL | Status: DC

## 2015-09-29 MED ORDER — AMOXICILLIN-POT CLAVULANATE 875-125 MG PO TABS: 1.0000 | ORAL_TABLET | Freq: Two times a day (BID) | ORAL | Status: AC

## 2015-09-29 MED ORDER — OMEPRAZOLE 40 MG PO CPDR: 40.0000 mg | DELAYED_RELEASE_CAPSULE | Freq: Every day | ORAL | Status: DC

## 2015-09-29 MED ORDER — METOPROLOL TARTRATE 25 MG PO TABS: 25.0000 mg | ORAL_TABLET | Freq: Every day | ORAL | Status: DC

## 2015-09-29 NOTE — Progress Notes (Signed)
Pre visit review using our clinic review tool, if applicable. No additional management support is needed unless otherwise documented below in the visit note. 

## 2015-09-29 NOTE — Assessment & Plan Note (Signed)
Chronic, stable. Continue current levothyroxine dose.  

## 2015-09-29 NOTE — Assessment & Plan Note (Signed)
Chronic, stable. Continue losartan 50mg  daily. Pt requests to start beta blocker given extensive family history of CAD/MI. Reasonable - start metoprolol 25mg  daily.

## 2015-09-29 NOTE — Assessment & Plan Note (Signed)
Stable on omeprazole 40mg daily - continue. 

## 2015-09-29 NOTE — Assessment & Plan Note (Signed)
Persistent symptoms despite treatment with zpack 1.5 wks ago.  Discussed long acting nature of azithromycin. Evidence sinus congestion on exam today - suggested start ibuprofen 400mg  with meals and if no improvement may fill augmentin Rx printed today.

## 2015-09-29 NOTE — Assessment & Plan Note (Addendum)
Chronic, stable. Discussed LDL and goal <100 given fmhx - pt hesitant to start statin, will work on healthy diet changes.

## 2015-09-29 NOTE — Assessment & Plan Note (Signed)

## 2015-09-29 NOTE — Patient Instructions (Addendum)
Call your insurance about the shingles shot to see if it is covered or how much it would cost and where is cheaper (here or pharmacy).  If you want to receive here, call for nurse visit.  Let us know if you'd like to received. Look into setting up advanced directive.  Start aspirin 78m daily. Start metoprolol 229monce daily.  Return as needed or in 1 year for next physical.  Health Maintenance, Female Adopting a healthy lifestyle and getting preventive care can go a long way to promote health and wellness. Talk with your health care provider about what schedule of regular examinations is right for you. This is a good chance for you to check in with your provider about disease prevention and staying healthy. In between checkups, there are plenty of things you can do on your own. Experts have done a lot of research about which lifestyle changes and preventive measures are most likely to keep you healthy. Ask your health care provider for more information. WEIGHT AND DIET  Eat a healthy diet  Be sure to include plenty of vegetables, fruits, low-fat dairy products, and lean protein.  Do not eat a lot of foods high in solid fats, added sugars, or salt.  Get regular exercise. This is one of the most important things you can do for your health.  Most adults should exercise for at least 150 minutes each week. The exercise should increase your heart rate and make you sweat (moderate-intensity exercise).  Most adults should also do strengthening exercises at least twice a week. This is in addition to the moderate-intensity exercise.  Maintain a healthy weight  Body mass index (BMI) is a measurement that can be used to identify possible weight problems. It estimates body fat based on height and weight. Your health care provider can help determine your BMI and help you achieve or maintain a healthy weight.  For females 2036ears of age and older:   A BMI below 18.5 is considered underweight.  A  BMI of 18.5 to 24.9 is normal.  A BMI of 25 to 29.9 is considered overweight.  A BMI of 30 and above is considered obese.  Watch levels of cholesterol and blood lipids  You should start having your blood tested for lipids and cholesterol at 2017ears of age, then have this test every 5 years.  You may need to have your cholesterol levels checked more often if:  Your lipid or cholesterol levels are high.  You are older than 5050ears of age.  You are at high risk for heart disease.  CANCER SCREENING   Lung Cancer  Lung cancer screening is recommended for adults [id number] old who are at high risk for lung cancer because of a history of smoking.  A yearly low-dose CT scan of the lungs is recommended for people who:  Currently smoke.  Have quit within the past 15 years.  Have at least a 30-pack-year history of smoking. A pack year is smoking an average of one pack of cigarettes a day for 1 year.  Yearly screening should continue until it has been 15 years since you quit.  Yearly screening should stop if you develop a health problem that would prevent you from having lung cancer treatment.  Breast Cancer  Practice breast self-awareness. This means understanding how your breasts normally appear and feel.  It also means doing regular breast self-exams. Let your health care provider know about any changes, no matter how small.  If you are in your 20s or 30s, you should have a clinical breast exam (CBE) by a health care provider every 1-3 years as part of a regular health exam.  If you are 24 or older, have a CBE every year. Also consider having a breast X-ray (mammogram) every year.  If you have a family history of breast cancer, talk to your health care provider about genetic screening.  If you are at high risk for breast cancer, talk to your health care provider about having an MRI and a mammogram every year.  Breast cancer gene (BRCA) assessment is recommended for women  who have family members with BRCA-related cancers. BRCA-related cancers include:  Breast.  Ovarian.  Tubal.  Peritoneal cancers.  Results of the assessment will determine the need for genetic counseling and BRCA1 and BRCA2 testing. Cervical Cancer Your health care provider may recommend that you be screened regularly for cancer of the pelvic organs (ovaries, uterus, and vagina). This screening involves a pelvic examination, including checking for microscopic changes to the surface of your cervix (Pap test). You may be encouraged to have this screening done every 3 years, beginning at age 56.  For women ages 56-65, health care providers may recommend pelvic exams and Pap testing every 3 years, or they may recommend the Pap and pelvic exam, combined with testing for human papilloma virus (HPV), every 5 years. Some types of HPV increase your risk of cervical cancer. Testing for HPV may also be done on women of any age with unclear Pap test results.  Other health care providers may not recommend any screening for nonpregnant women who are considered low risk for pelvic cancer and who do not have symptoms. Ask your health care provider if a screening pelvic exam is right for you.  If you have had past treatment for cervical cancer or a condition that could lead to cancer, you need Pap tests and screening for cancer for at least 20 years after your treatment. If Pap tests have been discontinued, your risk factors (such as having a new sexual partner) need to be reassessed to determine if screening should resume. Some women have medical problems that increase the chance of getting cervical cancer. In these cases, your health care provider may recommend more frequent screening and Pap tests. Colorectal Cancer  This type of cancer can be detected and often prevented.  Routine colorectal cancer screening usually begins at 61 years of age and continues through 61 years of age.  Your health care  provider may recommend screening at an earlier age if you have risk factors for colon cancer.  Your health care provider may also recommend using home test kits to check for hidden blood in the stool.  A small camera at the end of a tube can be used to examine your colon directly (sigmoidoscopy or colonoscopy). This is done to check for the earliest forms of colorectal cancer.  Routine screening usually begins at age 10.  Direct examination of the colon should be repeated every 5-10 years through 61 years of age. However, you may need to be screened more often if early forms of precancerous polyps or small growths are found. Skin Cancer  Check your skin from head to toe regularly.  Tell your health care provider about any new moles or changes in moles, especially if there is a change in a mole's shape or color.  Also tell your health care provider if you have a mole that is larger than the  size of a pencil eraser.  Always use sunscreen. Apply sunscreen liberally and repeatedly throughout the day.  Protect yourself by wearing long sleeves, pants, a wide-brimmed hat, and sunglasses whenever you are outside. HEART DISEASE, DIABETES, AND HIGH BLOOD PRESSURE   High blood pressure causes heart disease and increases the risk of stroke. High blood pressure is more likely to develop in:  People who have blood pressure in the high end of the normal range (130-139/85-89 mm Hg).  People who are overweight or obese.  People who are African American.  If you are 41-55 years of age, have your blood pressure checked every 3-5 years. If you are 86 years of age or older, have your blood pressure checked every year. You should have your blood pressure measured twice--once when you are at a hospital or clinic, and once when you are not at a hospital or clinic. Record the average of the two measurements. To check your blood pressure when you are not at a hospital or clinic, you can use:  An automated  blood pressure machine at a pharmacy.  A home blood pressure monitor.  If you are between 68 years and 32 years old, ask your health care provider if you should take aspirin to prevent strokes.  Have regular diabetes screenings. This involves taking a blood sample to check your fasting blood sugar level.  If you are at a normal weight and have a low risk for diabetes, have this test once every three years after 61 years of age.  If you are overweight and have a high risk for diabetes, consider being tested at a younger age or more often. PREVENTING INFECTION  Hepatitis B  If you have a higher risk for hepatitis B, you should be screened for this virus. You are considered at high risk for hepatitis B if:  You were born in a country where hepatitis B is common. Ask your health care provider which countries are considered high risk.  Your parents were born in a high-risk country, and you have not been immunized against hepatitis B (hepatitis B vaccine).  You have HIV or AIDS.  You use needles to inject street drugs.  You live with someone who has hepatitis B.  You have had sex with someone who has hepatitis B.  You get hemodialysis treatment.  You take certain medicines for conditions, including cancer, organ transplantation, and autoimmune conditions. Hepatitis C  Blood testing is recommended for:  Everyone born from [birth year] through 1966.  Anyone with known risk factors for hepatitis C. Sexually transmitted infections (STIs)  You should be screened for sexually transmitted infections (STIs) including gonorrhea and chlamydia if:  You are sexually active and are younger than 61 years of age.  You are older than 61 years of age and your health care provider tells you that you are at risk for this type of infection.  Your sexual activity has changed since you were last screened and you are at an increased risk for chlamydia or gonorrhea. Ask your health care provider if you are  at risk.  If you do not have HIV, but are at risk, it may be recommended that you take a prescription medicine daily to prevent HIV infection. This is called pre-exposure prophylaxis (PrEP). You are considered at risk if:  You are sexually active and do not regularly use condoms or know the HIV status of your partner(s).  You take drugs by injection.  You are sexually active with a partner who  has HIV. Talk with your health care provider about whether you are at high risk of being infected with HIV. If you choose to begin PrEP, you should first be tested for HIV. You should then be tested every 3 months for as long as you are taking PrEP.  PREGNANCY   If you are premenopausal and you may become pregnant, ask your health care provider about preconception counseling.  If you may become pregnant, take 400 to 800 micrograms (mcg) of folic acid every day.  If you want to prevent pregnancy, talk to your health care provider about birth control (contraception). OSTEOPOROSIS AND MENOPAUSE   Osteoporosis is a disease in which the bones lose minerals and strength with aging. This can result in serious bone fractures. Your risk for osteoporosis can be identified using a bone density scan.  If you are 30 years of age or older, or if you are at risk for osteoporosis and fractures, ask your health care provider if you should be screened.  Ask your health care provider whether you should take a calcium or vitamin D supplement to lower your risk for osteoporosis.  Menopause may have certain physical symptoms and risks.  Hormone replacement therapy may reduce some of these symptoms and risks. Talk to your health care provider about whether hormone replacement therapy is right for you.  HOME CARE INSTRUCTIONS   Schedule regular health, dental, and eye exams.  Stay current with your immunizations.   Do not use any tobacco products including cigarettes, chewing tobacco, or electronic  cigarettes.  If you are pregnant, do not drink alcohol.  If you are breastfeeding, limit how much and how often you drink alcohol.  Limit alcohol intake to no more than 1 drink per day for nonpregnant women. One drink equals 12 ounces of beer, 5 ounces of wine, or 1 ounces of hard liquor.  Do not use street drugs.  Do not share needles.  Ask your health care provider for help if you need support or information about quitting drugs.  Tell your health care provider if you often feel depressed.  Tell your health care provider if you have ever been abused or do not feel safe at home.   This information is not intended to replace advice given to you by your health care provider. Make sure you discuss any questions you have with your health care provider.   Document Released: 03/11/2011 Document Revised: 09/16/2014 Document Reviewed: 07/28/2013 Elsevier Interactive Patient Education [employer].

## 2015-09-29 NOTE — Assessment & Plan Note (Addendum)
Stable off wellbutrin. 

## 2015-09-29 NOTE — Progress Notes (Signed)
BP 130/70 mmHg  Pulse 88  Temp(Src) 98.3 F (36.8 C) (Oral)  Wt 177 lb 4 oz (80.4 kg)   CC: medicare wellness visit  Subjective:    Patient ID: Brittany [name], female    DOB: 1956-03-18, 60 y.o.   MRN: [id number]  HPI: Brittany [name] is a 60 y.o. female presenting on 09/29/2015 for Annual Exam   Stressful last year - husband hospitalized with acute cholecystitis with sepsis. Drain placed, discharged home, just had surgery this month. Slowly recovering.  Son recently got married.   Recently seen with URI, treated with hycodan and zpack. Persistent cough and malaise and congestion. Persistent head congestion and dyspnea, otherwise chest congestion has improved.   Passes hearing screen.  Vision screen with eye doctor. Denies depression/falls/anhedonia.  Preventative: COLONOSCOPY Date: 03/2012 partial colectomy, scattered diverticulae Brittany [name]), rpt 10 yrs ESOPHAGOGASTRODUODENOSCOPY Date: 03/2012 mod gastritis, benign polyps, H pylori neg Well woman - last when done hysterectomy (benign reason). Doesn't need rpt pap. Ovaries removed.  Mammogram 08/2015 WNL Flu shot - yearly Tdap - 09/2011 zostavax - not covered by medicare Advanced directives: does not have done. Has handout at home. Thinks would want sister Brittany [name] to be HCPOA. Seat belt use discussed Sunscreen use discussed. No changing moles on skin.  Caffeine: 2-3 cups/day (1-2 coffee)  Lives with husband and 69yo son, 4 dogs  Activity: no regular exercise  Diet: good water, fruits/vegetable daily   Relevant past medical, surgical, family and social history reviewed and updated as indicated. Interim medical history since our last visit reviewed. Allergies and medications reviewed and updated. Current Outpatient Prescriptions on File Prior to Visit  Medication Sig  . fish oil-omega-3 fatty acids 1000 MG capsule Take 1 g by mouth daily.  Marland Kitchen ibuprofen (ADVIL,MOTRIN) 200 MG tablet Take 600 mg by mouth as needed.   . Multiple  Minerals-Vitamins (CITRACAL PLUS PO) Take 1 tablet by mouth 2 (two) times daily. Total of 1600mg  calcium and 2000 IU vit D  . Multiple Vitamins-Minerals (ALIVE WOMENS ENERGY PO) Take 1 tablet by mouth daily.     No current facility-administered medications on file prior to visit.    Review of Systems Per HPI unless specifically indicated in ROS section     Objective:    BP 130/70 mmHg  Pulse 88  Temp(Src) 98.3 F (36.8 C) (Oral)  Wt 177 lb 4 oz (80.4 kg)  Wt Readings from Last 3 Encounters:  09/29/15 177 lb 4 oz (80.4 kg)  09/20/15 176 lb (79.833 kg)  03/08/14 185 lb 8 oz (84.142 kg)   Body mass index is 30.41 kg/(m^2).  Physical Exam  Constitutional: She is oriented to person, place, and time. She appears well-developed and well-nourished. No distress.  HENT:  Head: Normocephalic and atraumatic.  Right Ear: Hearing, tympanic membrane, external ear and ear canal normal.  Left Ear: Hearing, tympanic membrane, external ear and ear canal normal.  Nose: Mucosal edema and rhinorrhea present. Right sinus exhibits no maxillary sinus tenderness and no frontal sinus tenderness. Left sinus exhibits no maxillary sinus tenderness and no frontal sinus tenderness.  Mouth/Throat: Uvula is midline, oropharynx is clear and moist and mucous membranes are normal. No oropharyngeal exudate, posterior oropharyngeal edema, posterior oropharyngeal erythema or tonsillar abscesses.  Cerumen covering R canal  Eyes: Conjunctivae and EOM are normal. Pupils are equal, round, and reactive to light. No scleral icterus.  Neck: Normal range of motion. Neck supple. No thyromegaly present.  Cardiovascular: Normal rate, regular rhythm, normal heart sounds and  intact distal pulses.   No murmur heard. Pulses:      Radial pulses are 2+ on the right side, and 2+ on the left side.  Pulmonary/Chest: Effort normal and breath sounds normal. No respiratory distress. She has no wheezes. She has no rales.  Abdominal: Soft.  Bowel sounds are normal. She exhibits no distension and no mass. There is no tenderness. There is no rebound and no guarding.  Musculoskeletal: Normal range of motion. She exhibits no edema.  Lymphadenopathy:    She has no cervical adenopathy.  Neurological: She is alert and oriented to person, place, and time.  CN grossly intact, station and gait intact  Skin: Skin is warm and dry. No rash noted.  Psychiatric: She has a normal mood and affect. Her behavior is normal. Judgment and thought content normal.  Nursing note and vitals reviewed.  Results for orders placed or performed in visit on 09/25/15  Lipid panel  Result Value Ref Range   Cholesterol 192 0 - 200 mg/dL   Triglycerides 102.0 0.0 - 149.0 mg/dL   HDL 45.20 >39.00 mg/dL   VLDL 20.4 0.0 - 40.0 mg/dL   LDL Cholesterol 127 (H) 0 - 99 mg/dL   Total CHOL/HDL Ratio 4    NonHDL 147.23   TSH  Result Value Ref Range   TSH 1.91 0.35 - 4.50 uIU/mL  Basic metabolic panel  Result Value Ref Range   Sodium 141 135 - 145 mEq/L   Potassium 4.3 3.5 - 5.1 mEq/L   Chloride 104 96 - 112 mEq/L   CO2 24 19 - 32 mEq/L   Glucose, Bld 82 70 - 99 mg/dL   BUN 16 6 - 23 mg/dL   Creatinine, Ser 0.94 0.40 - 1.20 mg/dL   Calcium 8.6 8.4 - 10.5 mg/dL   GFR 64.44 >60.00 mL/min      Assessment & Plan:   Problem List Items Addressed This Visit    Obesity, Class I, BMI 30-34.9    Discussed healthy lifestyle to affect sustainable weight loss      Medicare annual wellness visit, subsequent - Primary    I have personally reviewed the Medicare Annual Wellness questionnaire and have noted 1. The patient's medical and social history 2. Their use of alcohol, tobacco or illicit drugs 3. Their current medications and supplements 4. The patient's functional ability including ADL's, fall risks, home safety risks and hearing or visual impairment. Cognitive function has been assessed and addressed as indicated.  5. Diet and physical activity 6. Evidence  for depression or mood disorders The patients weight, height, BMI have been recorded in the chart. I have made referrals, counseling and provided education to the patient based on review of the above and I have provided the pt with a written personalized care plan for preventive services. Provider list updated.. See scanned questionairre as needed for further documentation. Reviewed preventative protocols and updated unless pt declined.       Hypothyroidism    Chronic, stable. Continue current levothyroxine dose      Relevant Medications   metoprolol tartrate (LOPRESSOR) 25 MG tablet   levothyroxine (SYNTHROID, LEVOTHROID) 112 MCG tablet   HTN (hypertension)    Chronic, stable. Continue losartan 50mg  daily. Pt requests to start beta blocker given extensive family history of CAD/MI. Reasonable - start metoprolol 25mg  daily.      Relevant Medications   aspirin (ASPIRIN EC) 81 MG EC tablet   metoprolol tartrate (LOPRESSOR) 25 MG tablet   losartan (COZAAR) 50 MG  tablet   HLD (hyperlipidemia)    Chronic, stable. Discussed LDL and goal <100 given fmhx - pt hesitant to start statin, will work on healthy diet changes.       Relevant Medications   aspirin (ASPIRIN EC) 81 MG EC tablet   metoprolol tartrate (LOPRESSOR) 25 MG tablet   losartan (COZAAR) 50 MG tablet   GERD (gastroesophageal reflux disease)    Stable on omeprazole 40mg  daily - continue      Relevant Medications   omeprazole (PRILOSEC) 40 MG capsule   Depression    Stable off wellbutrin.      Advanced care planning/counseling discussion    Advanced directives: does not have done. Has handout at home. Thinks would want sister Brittany [name] to be HCPOA.      Acute sinusitis    Persistent symptoms despite treatment with zpack 1.5 wks ago.  Discussed long acting nature of azithromycin. Evidence sinus congestion on exam today - suggested start ibuprofen 400mg  with meals and if no improvement may fill augmentin Rx printed today.       Relevant Medications   amoxicillin-clavulanate (AUGMENTIN) 875-125 MG tablet       Follow up plan: Return in about 1 year (around 09/28/2016), or as needed, for annual exam, prior fasting for blood work.

## 2015-09-29 NOTE — Assessment & Plan Note (Signed)
Advanced directives: does not have done. Has handout at home. Thinks would want sister [name] to be HCPOA. 

## 2015-09-29 NOTE — Addendum Note (Signed)
Addended by: Ellamae Sia on: 09/29/2015 12:52 PM   Modules accepted: Orders

## 2015-09-29 NOTE — Assessment & Plan Note (Signed)
Discussed healthy lifestyle to affect sustainable weight loss

## 2015-11-06 DIAGNOSIS — H04123 Dry eye syndrome of bilateral lacrimal glands: Secondary | ICD-10-CM | POA: Diagnosis not present

## 2015-11-06 DIAGNOSIS — H40033 Anatomical narrow angle, bilateral: Secondary | ICD-10-CM | POA: Diagnosis not present

## 2016-01-26 ENCOUNTER — Encounter: Payer: Self-pay | Admitting: Obstetrics & Gynecology

## 2016-01-26 ENCOUNTER — Ambulatory Visit (INDEPENDENT_AMBULATORY_CARE_PROVIDER_SITE_OTHER): Payer: Medicare Other | Admitting: Obstetrics & Gynecology

## 2016-01-26 VITALS — BP 136/80 | HR 76 | Resp 18 | Ht 64.0 in | Wt 185.0 lb

## 2016-01-26 DIAGNOSIS — N951 Menopausal and female climacteric states: Secondary | ICD-10-CM

## 2016-01-26 DIAGNOSIS — R5382 Chronic fatigue, unspecified: Secondary | ICD-10-CM | POA: Diagnosis not present

## 2016-01-26 MED ORDER — ESTRADIOL 1 MG PO TABS: 1.0000 mg | ORAL_TABLET | Freq: Every day | ORAL | Status: DC

## 2016-01-26 NOTE — Progress Notes (Signed)
Pt here today c/o surgical menopausal symptoms which include hot flashes, mood swings, and vaginal dryness.

## 2016-01-26 NOTE — Progress Notes (Signed)
   Subjective:    Patient ID: Brittany [name], female    DOB: 05-Dec-1955, 60 y.o.   MRN: [id number]  HPI 60 yo MW P2 (77 and 58 yo kids, through marriage has great grandkids) here today with the issue of hot flashes, night sweats, crying for good reason (for about a year), vaginal dryness. She went though menopause around 60 yo. She started on HRT (prempro) and this helped with some of her symptoms, but not the moodiness. She then stopped that when her neighbor got breast cancer. She then went nuts, then started at [facility] in [place], gettting estrogen pellets. They were wonderful but cost $200 per 3 months.   Review of Systems Lots of stress, many deaths in the last year. TSH normal 1/17 She has tried SSRIs in the past and bupropion when she was in [place]    Objective:   Physical Exam WNWHWFNAD Breathing, conversing, and ambulating normally        Assessment & Plan:   Menopausal symptoms Possible depression Fatigue- check Vit D

## 2016-01-27 LAB — VITAMIN D 25 HYDROXY (VIT D DEFICIENCY, FRACTURES): VIT D 25 HYDROXY: 36 ng/mL (ref 30–100)

## 2016-02-19 ENCOUNTER — Ambulatory Visit: Payer: Medicare Other | Admitting: Obstetrics & Gynecology

## 2016-02-26 ENCOUNTER — Ambulatory Visit (INDEPENDENT_AMBULATORY_CARE_PROVIDER_SITE_OTHER): Payer: Medicare Other | Admitting: Obstetrics & Gynecology

## 2016-02-26 ENCOUNTER — Encounter: Payer: Self-pay | Admitting: Obstetrics & Gynecology

## 2016-02-26 VITALS — BP 115/74 | HR 64 | Resp 16 | Ht 64.0 in | Wt 186.0 lb

## 2016-02-26 DIAGNOSIS — F329 Major depressive disorder, single episode, unspecified: Secondary | ICD-10-CM

## 2016-02-26 DIAGNOSIS — N951 Menopausal and female climacteric states: Secondary | ICD-10-CM | POA: Diagnosis not present

## 2016-02-26 DIAGNOSIS — F32A Depression, unspecified: Secondary | ICD-10-CM

## 2016-02-26 MED ORDER — TRAZODONE HCL 100 MG PO TABS: 100.0000 mg | ORAL_TABLET | Freq: Every day | ORAL | Status: DC

## 2016-02-26 NOTE — Progress Notes (Signed)
   Subjective:    Patient ID: Brittany [name], female    DOB: 1955/12/22, 60 y.o.   MRN: [id number]  HPI 60 yo MW P2 here for follow up after starting estradiol 1 mg and testosterone ointment QOD for menopausal symptoms and decreased libido. In the past she did very well with estrogen pellets but found the cost to be prohibitive. She is having trouble sleeping, still feeling depressed.  The test has not improved her libido. Her husband feels that the decreased libido is due to the depression.  Review of Systems     Objective:   Physical Exam WNWHWFNAD Teary at times Conversing, ambulating and breathing normally        Assessment & Plan:  Depression with difficulty sleeping- trial of trazadone 100 mg qhs Menopausal symptoms- I have offered to try other estrogen formulations versus just going back to [facility] in [place] for the estrogen pellets I have suggested a counselor as she is under a great deal of stress RTC 2 months/prn sooner

## 2016-02-26 NOTE — Progress Notes (Signed)
Pt here to follow up on hormone therapy. She states she has had no improvement. She still c/o feeling "weepy, moody, no sex drive, and hot flashes"

## 2016-02-27 DIAGNOSIS — N951 Menopausal and female climacteric states: Secondary | ICD-10-CM | POA: Diagnosis not present

## 2016-02-27 DIAGNOSIS — R635 Abnormal weight gain: Secondary | ICD-10-CM | POA: Diagnosis not present

## 2016-02-28 LAB — COMPLETE METABOLIC PANEL WITH GFR
ALK PHOS: 56 U/L
ALT: 7
AST: 16 U/L
BILIRUBIN, TOTAL: 0.2
CREATININE: 0.99
Glucose: 98
Potassium: 4.2 mmol/L
Sodium: 142

## 2016-02-28 LAB — ESTRADIOL: Estradiol: 72.9

## 2016-02-28 LAB — CBC
HEMOGLOBIN: 13 g/dL
WBC: 7.4
platelet count: 257

## 2016-02-28 LAB — THYROID PANEL
T4,FREE (DIRECT): 1.05
TSH: 6.92
Triiodothyronine, Free, Serum: 2.4

## 2016-02-28 LAB — TESTOSTERONE: TESTOSTERONE: 20

## 2016-02-28 LAB — FOLLICLE STIMULATING HORMONE: FSH: 83.8

## 2016-03-04 DIAGNOSIS — Z6832 Body mass index (BMI) 32.0-32.9, adult: Secondary | ICD-10-CM | POA: Diagnosis not present

## 2016-03-04 DIAGNOSIS — E8881 Metabolic syndrome: Secondary | ICD-10-CM | POA: Diagnosis not present

## 2016-03-04 DIAGNOSIS — N951 Menopausal and female climacteric states: Secondary | ICD-10-CM | POA: Diagnosis not present

## 2016-03-04 DIAGNOSIS — E538 Deficiency of other specified B group vitamins: Secondary | ICD-10-CM | POA: Diagnosis not present

## 2016-03-04 DIAGNOSIS — E039 Hypothyroidism, unspecified: Secondary | ICD-10-CM | POA: Diagnosis not present

## 2016-03-08 ENCOUNTER — Ambulatory Visit (INDEPENDENT_AMBULATORY_CARE_PROVIDER_SITE_OTHER): Payer: Medicare Other | Admitting: Family Medicine

## 2016-03-08 ENCOUNTER — Encounter: Payer: Self-pay | Admitting: Family Medicine

## 2016-03-08 VITALS — BP 94/68 | HR 58 | Temp 98.0°F | Wt 187.0 lb

## 2016-03-08 DIAGNOSIS — E039 Hypothyroidism, unspecified: Secondary | ICD-10-CM | POA: Diagnosis not present

## 2016-03-08 MED ORDER — LEVOTHYROXINE SODIUM 125 MCG PO TABS: 125.0000 ug | ORAL_TABLET | Freq: Every day | ORAL | Status: DC

## 2016-03-08 NOTE — Progress Notes (Signed)
BP 94/68 mmHg  Pulse 58  Temp(Src) 98 F (36.7 C) (Oral)  Wt 187 lb (84.823 kg)  SpO2 94%   CC: "thyroid is off"  Subjective:    Patient ID: Brittany [name], female    DOB: 1956/04/01, 60 y.o.   MRN: [id number]  HPI: Brittany [name] is a 60 y.o. female presenting on 03/08/2016 for Hypothyroidism   Brings labs from [place] ordered by Dr Justin Mend - to start hormone pellets inserted subQ buttock Q3 months - very effective last year.  Estrogen/testosterone prescribed by Dr Hulan Fray did not help.   WNL CMP, CBC, TSH high at 6.9 (reference range 0.45-4.5), normal free T3 and free T4. Sent back to [place].   Her hypothyroidism is managed on levothyroxine 112 mcg daily. She is taking thyroid medicine with PPI, blood pressure meds, and b12.   Generalized malaise and low energy. Endorses sudden weight gain. Skin stays dry.  No cold intolerance, hair changes, constipation.   Relevant past medical, surgical, family and social history reviewed and updated as indicated. Interim medical history since our last visit reviewed. Allergies and medications reviewed and updated. Current Outpatient Prescriptions on File Prior to Visit  Medication Sig  . aspirin (ASPIRIN EC) 81 MG EC tablet Take 81 mg by mouth daily. Swallow whole.  . Cyanocobalamin (VITAMIN B 12 PO) Take by mouth.  . fish oil-omega-3 fatty acids 1000 MG capsule Take 1 g by mouth daily.  Marland Kitchen ibuprofen (ADVIL,MOTRIN) 200 MG tablet Take 600 mg by mouth as needed.   Marland Kitchen losartan (COZAAR) 50 MG tablet Take 1 tablet (50 mg total) by mouth daily.  . metoprolol tartrate (LOPRESSOR) 25 MG tablet Take 1 tablet (25 mg total) by mouth daily.  . Multiple Vitamins-Minerals (ALIVE WOMENS ENERGY PO) Take 1 tablet by mouth daily.    Marland Kitchen omeprazole (PRILOSEC) 40 MG capsule Take 1 capsule (40 mg total) by mouth daily.  . traZODone (DESYREL) 100 MG tablet Take 1 tablet (100 mg total) by mouth at bedtime.  . Turmeric 500 MG TABS Take 1 tablet by mouth daily.   No  current facility-administered medications on file prior to visit.    Review of Systems Per HPI unless specifically indicated in ROS section     Objective:    BP 94/68 mmHg  Pulse 58  Temp(Src) 98 F (36.7 C) (Oral)  Wt 187 lb (84.823 kg)  SpO2 94%  Wt Readings from Last 3 Encounters:  03/08/16 187 lb (84.823 kg)  02/26/16 186 lb (84.369 kg)  01/26/16 185 lb (83.915 kg)    Physical Exam  Constitutional: She appears well-developed and well-nourished. No distress.  Neck: No thyromegaly present.  Nursing note and vitals reviewed.  Results for orders placed or performed in visit on 01/26/16  Vitamin D (25 hydroxy)  Result Value Ref Range   Vit D, 25-Hydroxy 36 30 - 100 ng/mL   Lab Results  Component Value Date   TSH 1.91 09/25/2015       Assessment & Plan:   Problem List Items Addressed This Visit    Hypothyroidism - Primary    TSH elevated on recent labwork - and pt endorses some hypothyroid symptoms. Will increase levothyroxine to 175mcg daily. Reviewed correct administration of medication. Return in 6 wks for rpt TFTs.      Relevant Medications   levothyroxine (SYNTHROID, LEVOTHROID) 125 MCG tablet   Other Relevant Orders   TSH   T3   T4, free       Follow  up plan: Return if symptoms worsen or fail to improve.  Ria Bush, MD

## 2016-03-08 NOTE — Assessment & Plan Note (Signed)
TSH elevated on recent labwork - and pt endorses some hypothyroid symptoms. Will increase levothyroxine to 131mcg daily. Reviewed correct administration of medication. Return in 6 wks for rpt TFTs.

## 2016-03-08 NOTE — Patient Instructions (Addendum)
Increase levothyroxine to 115mcg daily - new dose is at pharmacy. Check thyroid functions 4-6 weeks after change, if needed may return here for that. Otherwise bring [place] copy of labs.   Levothyroxine tablets What is this medicine? LEVOTHYROXINE (lee voe thye ROX een) is a thyroid hormone. This medicine can improve symptoms of thyroid deficiency such as slow speech, lack of energy, weight gain, hair loss, dry skin, and feeling cold. It also helps to treat goiter (an enlarged thyroid gland). It is also used to treat some kinds of thyroid cancer along with surgery and other medicines. This medicine may be used for other purposes; ask your health care provider or pharmacist if you have questions. What should I tell my health care provider before I take this medicine? They need to know if you have any of these conditions: -angina -blood clotting problems -diabetes -dieting or on a weight loss program -fertility problems -heart disease -high levels of thyroid hormone -pituitary gland problem -previous heart attack -an unusual or allergic reaction to levothyroxine, thyroid hormones, other medicines, foods, dyes, or preservatives -pregnant or trying to get pregnant -breast-feeding How should I use this medicine? Take this medicine by mouth with plenty of water. It is best to take on an empty stomach, at least 30 minutes before or 2 hours after food. Follow the directions on the prescription label. Take at the same time each day. Do not take your medicine more often than directed. Contact your pediatrician regarding the use of this medicine in children. While this drug may be prescribed for children and infants as young as a few days of age for selected conditions, precautions do apply. For infants, you may crush the tablet and place in a small amount of (5-10 ml or 1 to 2 teaspoonfuls) of water, breast milk, or non-soy based infant formula. Do not mix with soy-based infant formula. Give as  directed. Overdosage: If you think you have taken too much of this medicine contact a poison control center or emergency room at once. NOTE: This medicine is only for you. Do not share this medicine with others. What if I miss a dose? If you miss a dose, take it as soon as you can. If it is almost time for your next dose, take only that dose. Do not take double or extra doses. What may interact with this medicine? -amiodarone -antacids -anti-thyroid medicines -calcium supplements -carbamazepine -cholestyramine -colestipol -digoxin -female hormones, including contraceptive or birth control pills -iron supplements -ketamine -liquid nutrition products like Ensure -medicines for colds and breathing difficulties -medicines for diabetes -medicines for mental depression -medicines or herbals used to decrease weight or appetite -phenobarbital or other barbiturate medications -phenytoin -prednisone or other corticosteroids -rifabutin -rifampin -soy isoflavones -sucralfate -theophylline -warfarin This list may not describe all possible interactions. Give your health care provider a list of all the medicines, herbs, non-prescription drugs, or dietary supplements you use. Also tell them if you smoke, drink alcohol, or use illegal drugs. Some items may interact with your medicine. What should I watch for while using this medicine? Be sure to take this medicine with plenty of fluids. Some tablets may cause choking, gagging, or difficulty swallowing from the tablet getting stuck in your throat. Most of these problems disappear if the medicine is taken with the right amount of water or other fluids. Do not switch brands of this medicine unless your health care professional agrees with the change. Ask questions if you are uncertain. You will need regular exams and occasional  blood tests to check the response to treatment. If you are receiving this medicine for an underactive thyroid, it may be  several weeks before you notice an improvement. Check with your doctor or health care professional if your symptoms do not improve. It may be necessary for you to take this medicine for the rest of your life. Do not stop using this medicine unless your doctor or health care professional advises you to. This medicine can affect blood sugar levels. If you have diabetes, check your blood sugar as directed. You may lose some of your hair when you first start treatment. With time, this usually corrects itself. If you are going to have surgery, tell your doctor or health care professional that you are taking this medicine. What side effects may I notice from receiving this medicine? Side effects that you should report to your doctor or health care professional as soon as possible: -allergic reactions like skin rash, itching or hives, swelling of the face, lips, or tongue -chest pain -excessive sweating or intolerance to heat -fast or irregular heartbeat -nervousness -skin rash or hives -swelling of ankles, feet, or legs -tremors Side effects that usually do not require medical attention (report to your doctor or health care professional if they continue or are bothersome): -changes in appetite -changes in menstrual periods -diarrhea -hair loss -headache -trouble sleeping -weight loss This list may not describe all possible side effects. Call your doctor for medical advice about side effects. You may report side effects to FDA at 1-800-FDA-1088. Where should I keep my medicine? Keep out of the reach of children. Store at room temperature between 15 and 30 degrees C (59 and 86 degrees F). Protect from light and moisture. Keep container tightly closed. Throw away any unused medicine after the expiration date. NOTE: This sheet is a summary. It may not cover all possible information. If you have questions about this medicine, talk to your doctor, pharmacist, or health care provider.    2016,  Elsevier/Gold Standard. (2008-12-02 14:28:07)

## 2016-03-08 NOTE — Progress Notes (Signed)
Pre visit review using our clinic review tool, if applicable. No additional management support is needed unless otherwise documented below in the visit note. 

## 2016-03-13 ENCOUNTER — Encounter: Payer: Self-pay | Admitting: *Deleted

## 2016-03-27 DIAGNOSIS — N958 Other specified menopausal and perimenopausal disorders: Secondary | ICD-10-CM | POA: Diagnosis not present

## 2016-04-04 DIAGNOSIS — N951 Menopausal and female climacteric states: Secondary | ICD-10-CM | POA: Diagnosis not present

## 2016-04-05 LAB — TESTOSTERONE: TESTOSTERONE: 293

## 2016-04-05 LAB — FOLLICLE STIMULATING HORMONE: FSH: 72.3

## 2016-04-05 LAB — ESTRADIOL: ESTRADIOL: 73.8

## 2016-04-11 DIAGNOSIS — E538 Deficiency of other specified B group vitamins: Secondary | ICD-10-CM | POA: Diagnosis not present

## 2016-04-11 DIAGNOSIS — N958 Other specified menopausal and perimenopausal disorders: Secondary | ICD-10-CM | POA: Diagnosis not present

## 2016-04-11 DIAGNOSIS — Z6832 Body mass index (BMI) 32.0-32.9, adult: Secondary | ICD-10-CM | POA: Diagnosis not present

## 2016-04-22 ENCOUNTER — Other Ambulatory Visit (INDEPENDENT_AMBULATORY_CARE_PROVIDER_SITE_OTHER): Payer: Medicare Other

## 2016-04-22 DIAGNOSIS — E039 Hypothyroidism, unspecified: Secondary | ICD-10-CM | POA: Diagnosis not present

## 2016-04-22 LAB — TSH: TSH: 0.22 u[IU]/mL — ABNORMAL LOW (ref 0.35–4.50)

## 2016-04-22 LAB — T4, FREE: Free T4: 1.1 ng/dL (ref 0.60–1.60)

## 2016-04-23 LAB — T3: T3, Total: 111 ng/dL (ref 76–181)

## 2016-04-27 ENCOUNTER — Other Ambulatory Visit: Payer: Self-pay | Admitting: Family Medicine

## 2016-04-27 DIAGNOSIS — E039 Hypothyroidism, unspecified: Secondary | ICD-10-CM

## 2016-05-05 ENCOUNTER — Other Ambulatory Visit: Payer: Self-pay | Admitting: Family Medicine

## 2016-05-05 DIAGNOSIS — E039 Hypothyroidism, unspecified: Secondary | ICD-10-CM

## 2016-05-05 MED ORDER — LEVOTHYROXINE SODIUM 112 MCG PO TABS: 112.0000 ug | ORAL_TABLET | Freq: Every day | ORAL | 1 refills | Status: DC

## 2016-06-04 ENCOUNTER — Encounter: Payer: Self-pay | Admitting: *Deleted

## 2016-06-04 DIAGNOSIS — N951 Menopausal and female climacteric states: Secondary | ICD-10-CM | POA: Diagnosis not present

## 2016-06-07 DIAGNOSIS — H40033 Anatomical narrow angle, bilateral: Secondary | ICD-10-CM | POA: Diagnosis not present

## 2016-06-07 DIAGNOSIS — H2513 Age-related nuclear cataract, bilateral: Secondary | ICD-10-CM | POA: Diagnosis not present

## 2016-06-07 DIAGNOSIS — E538 Deficiency of other specified B group vitamins: Secondary | ICD-10-CM | POA: Diagnosis not present

## 2016-06-07 DIAGNOSIS — N958 Other specified menopausal and perimenopausal disorders: Secondary | ICD-10-CM | POA: Diagnosis not present

## 2016-06-07 DIAGNOSIS — E039 Hypothyroidism, unspecified: Secondary | ICD-10-CM | POA: Diagnosis not present

## 2016-06-07 DIAGNOSIS — N951 Menopausal and female climacteric states: Secondary | ICD-10-CM | POA: Diagnosis not present

## 2016-06-07 DIAGNOSIS — E8881 Metabolic syndrome: Secondary | ICD-10-CM | POA: Diagnosis not present

## 2016-06-07 DIAGNOSIS — Z6832 Body mass index (BMI) 32.0-32.9, adult: Secondary | ICD-10-CM | POA: Diagnosis not present

## 2016-06-12 DIAGNOSIS — Z23 Encounter for immunization: Secondary | ICD-10-CM | POA: Diagnosis not present

## 2016-07-10 ENCOUNTER — Ambulatory Visit (INDEPENDENT_AMBULATORY_CARE_PROVIDER_SITE_OTHER): Payer: Medicare Other | Admitting: Cardiology

## 2016-07-10 ENCOUNTER — Encounter: Payer: Self-pay | Admitting: Cardiology

## 2016-07-10 VITALS — BP 132/70 | HR 60 | Ht 64.0 in | Wt 188.4 lb

## 2016-07-10 DIAGNOSIS — R002 Palpitations: Secondary | ICD-10-CM | POA: Insufficient documentation

## 2016-07-10 DIAGNOSIS — G4733 Obstructive sleep apnea (adult) (pediatric): Secondary | ICD-10-CM

## 2016-07-10 DIAGNOSIS — R0609 Other forms of dyspnea: Secondary | ICD-10-CM | POA: Insufficient documentation

## 2016-07-10 DIAGNOSIS — G479 Sleep disorder, unspecified: Secondary | ICD-10-CM | POA: Insufficient documentation

## 2016-07-10 DIAGNOSIS — R5383 Other fatigue: Secondary | ICD-10-CM | POA: Diagnosis not present

## 2016-07-10 DIAGNOSIS — E785 Hyperlipidemia, unspecified: Secondary | ICD-10-CM

## 2016-07-10 DIAGNOSIS — E669 Obesity, unspecified: Secondary | ICD-10-CM

## 2016-07-10 DIAGNOSIS — I10 Essential (primary) hypertension: Secondary | ICD-10-CM

## 2016-07-10 NOTE — Patient Instructions (Signed)
Medication Instructions:  Continue metoprolol as discussed  Labwork: None   Testing/Procedures: Your physician has recommended that you have a cardiopulmonary stress test (CPX). CPX testing is a non-invasive measurement of heart and lung function. It replaces a traditional treadmill stress test. This type of test provides a tremendous amount of information that relates not only to your present condition but also for future outcomes. This test combines measurements of you ventilation, respiratory gas exchange in the lungs, electrocardiogram (EKG), blood pressure and physical response before, during, and following an exercise protocol.  Your physician has recommended that you wear an event monitor for 2 weeks, take Metoprolol in the morning and then take in the evening on the second week of monitoring.  [facility] [place] OFFICE. Event monitors are medical devices that record the heart's electrical activity. Doctors most often [place] these monitors to diagnose arrhythmias. Arrhythmias are problems with the speed or rhythm of the heartbeat. The monitor is a small, portable device. You can wear one while you do your normal daily activities. This is usually used to diagnose what is causing palpitations/syncope (passing out).  Your physician has recommended that you have a sleep study. This test records several body functions during sleep, including: brain activity, eye movement, oxygen and carbon dioxide blood levels, heart rate and rhythm, breathing rate and rhythm, the flow of air through your mouth and nose, snoring, body muscle movements, and chest and belly movement.   Follow-Up: Your physician recommends that you schedule a follow-up appointment in: [place]     If you need a refill on your cardiac medications before your next appointment, please call your pharmacy.

## 2016-07-10 NOTE — Assessment & Plan Note (Signed)
Is difficult tell if her daytime fatigue is related to his sleep pattern disturbance or OSA. But her Epworth score is 20. Recommendation is to continue to use trazodone that was prescribed for her as that seemed to help some. We will also order a polysomnogram to exclude OSA.  She may benefit from sleep medicine evaluation.

## 2016-07-10 NOTE — Progress Notes (Addendum)
PCP: Ria Bush, MD  Clinic Note: Chief Complaint  Patient presents with  . New Patient (Initial Visit)    palpitations. patient can feel her heart " pounding' at night when she lays down.    HPI: Christany Beller is a 60 y.o. female with a PMH below who presents today for Several complaints including palpitations, poor sleep, exertional dyspnea. He has a history of hypertension, hypothyroidism and probably has some dysthymia/anxiety symptoms. She also has chronic back pain and receives intermittent back injections. For her hypertension, she takes losartan and once daily metoprolol in the morning.  Allena Shrode is the wife of one of my patients, who has self-referred herself for persistent symptoms.  Recent Hospitalizations: None  Studies Reviewed: None  Interval History: [name] presents today with several complaints. We however she denies any resting or exertional chest tightness or pressure. Denies any PND, orthopnea or edema. She denies any TIA or amaurosis fugax symptoms, but does note some near syncope and dizziness that is relatively rare. He very much enjoys walking and not for routine exercise, but as a way of exercising. She likes to walk places instead of driving, when asked to walk around her neighborhood and while shopping.  Complaints are:  Palpitations/fluttering: These occur mostly at night. She feels a pounding hard beating sensation in her chest that is not necessarily fast, but just very hard and forceful. She can't really tell if anything particularly makes it worse, but she does it occurs may be every other night or so. She mostly notes she is lying down trying to rest. But she also indicates that when she is lying down she is always having her mind racing and taking things that need to be done. Occasionally night she'll wake up after right down things so that she remember them, otherwise she will not be able to go back to sleep  Exertional dyspnea: She says that  she gets short of breath. She notes that simply just walking around the house or walking from the car to the household short of breath on occasion. Walking up a hill or up stairs will often make her short of breath. She has noticed maybe a little last several months that this is gotten worse, and she is not able to do the same walking that she was able to do. She is contributing to her weight, but is trying to walk to lose weight.  Poor sleep: She notes that she has a hard time going to sleep and then when she falls asleep if she does wake up she cannot get back to sleep. She feels tired all the time, indicating that she would fall asleep at the drop of a hat if something didn't keep her going through the day. She was given trazodone to help her sleep which does help him, but she is reluctant to take it everyday because of his antidepressant medicine.  Epworth Sleepiness Scale: Situation   Chance of Dozing/Sleeping (0 = never , 1 = slight chance , 2 = moderate chance , 3 = high chance )   sitting and reading 3   watching TV 3   sitting inactive in a public place 2   being a passenger in a motor vehicle for an hour or more 3   lying down in the afternoon 3   sitting and talking to someone 0   sitting quietly after lunch (no alcohol) 3   while stopped for a few minutes in traffic as the driver 3  Total Score  20**!   No claudication.  ROS: A comprehensive was performed. Review of Systems  Constitutional: Positive for malaise/fatigue (Feels tired all the time). Negative for chills, fever and weight loss.  HENT: Negative for congestion and nosebleeds.   Eyes: Negative for blurred vision.  Respiratory: Positive for shortness of breath (With exertion). Negative for cough and wheezing.   Cardiovascular: Positive for palpitations. Negative for chest pain, orthopnea, claudication, leg swelling and PND.  Gastrointestinal: Negative for blood in stool, constipation, heartburn and melena.    Genitourinary: Negative for hematuria.  Musculoskeletal: Negative for falls, joint pain and myalgias.  Neurological: Positive for dizziness and headaches (Occasional). Negative for tingling and loss of consciousness.  Endo/Heme/Allergies: Negative for environmental allergies.  Psychiatric/Behavioral: Negative for depression (She denies having a depressed mood, but does describe some dysthymia symptoms), memory loss and substance abuse. The patient is nervous/anxious (She is always worried. Always keeping an eye out for her husband. Was always this way with her children as well. She tends to pick her fingernails.) and has insomnia.   All other systems reviewed and are negative.   Past Medical History:  Diagnosis Date  . Arthritis    neck pain and numbness left digits Vertell Limber)  . Chronic lower back pain    persistent after L1 cmp fx, with multilevel mild lumbar DDD - good resolution after L L5/S1 ESI ([facility])  . Colon polyps    last colonoscopy 2006? rpt due  . Diverticulitis   . Fatty liver 02/2012   on CT scan 02/2012  . GERD (gastroesophageal reflux disease)   . History of diverticulitis of colon    s/p resection of large intestine, rpt itis 02/2012  . HTN (hypertension)   . Hypothyroid   . Migraines    occasional, stress related  . OSA (obstructive sleep apnea)    mild, no CPAP  . Postmenopausal    HRT compound, previously premarin  . Seasonal allergies   . Vertebral compression fracture ([place]) 07/2012   L1 25% Vertell Limber) released without intervention    Past Surgical History:  Procedure Laterality Date  . ANTERIOR FUSION CERVICAL SPINE  01/1999   cervical HNP  . COLON RESECTION  2008   2 ft removed, diverticulitis  . COLONOSCOPY  03/2012   partial colectomy, scattered diverticulae [name], rpt 10 yrs  . ESI Left 06/2013, 12/2013, 09/2014   L5/S1 with good resolution of pain  . ESOPHAGOGASTRODUODENOSCOPY  03/2012   mod gastritis, benign polyps, H pylori neg  . FOOT SURGERY  Left 01/2014   big toe fusion  . KNEE SURGERY  1995, 2011   torn menisci (MRI 2010 L)  . SPIROMETRY  2011   normal  . TONSILLECTOMY  1964  . TOTAL ABDOMINAL HYSTERECTOMY W/ BILATERAL SALPINGOOPHORECTOMY  06/2009   vag bleeding  . US ECHOCARDIOGRAPHY  08/2010   normal, EF 63%, mild valvular issues     Prior to Admission medications   Medication Sig Start Date End Date Taking? Authorizing Provider  aspirin (ASPIRIN EC) 81 MG EC tablet Take 81 mg by mouth daily. Swallow whole.   Yes Historical Provider, MD  Cyanocobalamin (VITAMIN B 12 PO) Take by mouth.   Yes Historical Provider, MD  fish oil-omega-3 fatty acids 1000 MG capsule Take 1 g by mouth daily.   Yes Historical Provider, MD  ibuprofen (ADVIL,MOTRIN) 200 MG tablet Take 600 mg by mouth as needed.    Yes Historical Provider, MD  levothyroxine (SYNTHROID, LEVOTHROID) 112 MCG tablet Take  1 tablet (112 mcg total) by mouth daily before breakfast. 05/05/16  Yes Ria Bush, MD  losartan (COZAAR) 50 MG tablet Take 1 tablet (50 mg total) by mouth daily. 09/29/15  Yes Ria Bush, MD  metoprolol tartrate (LOPRESSOR) 25 MG tablet Take 1 tablet (25 mg total) by mouth daily. 09/29/15  Yes Ria Bush, MD  Multiple Vitamins-Minerals (ALIVE WOMENS ENERGY PO) Take 1 tablet by mouth daily.     Yes Historical Provider, MD  omeprazole (PRILOSEC) 40 MG capsule Take 1 capsule (40 mg total) by mouth daily. 09/29/15  Yes Ria Bush, MD  traZODone (DESYREL) 100 MG tablet Take 1 tablet (100 mg total) by mouth at bedtime. 02/26/16  Yes Myra Marijo Sanes, MD  Turmeric 500 MG TABS Take 1 tablet by mouth daily.   Yes Historical Provider, MD   Allergies  Allergen Reactions  . Cymbalta [Duloxetine Hcl] Other (See Comments)    suicidality  . Percocet [Oxycodone-Acetaminophen] Other (See Comments)    Nauseated, dizzy     Social History   Social History  . Marital status: Married    Spouse name: N/A  . Number of children: 2  . Years of  education: HS   Occupational History  .  Not Employed   Social History Main Topics  . Smoking status: Never Smoker  . Smokeless tobacco: Never Used  . Alcohol use Yes     Comment: rare  . Drug use: No  . Sexual activity: Yes    Birth control/ protection: Surgical   Other Topics Concern  . None   Social History Narrative   Caffeine: 2-3 cups/day (1-2 coffee); occasional beer   Lives with husband and 8yo son, 4 dogs   Activity: no scheduled - enjoys walking, but just usually run around doing shopping etc. Does not do routine exercise.   Diet: lots of water, fruits/vegetable   Family History  Problem Relation Age of Onset  . Coronary artery disease Mother 58    CABG and valve replacement  . Arthritis Mother   . Valvular heart disease Mother     Had valve replacement  . Coronary artery disease Father 52    CAD/MI  . Hypertension Father   . Heart disease Father   . Diabetes Sister   . Hypertension Sister   . Hypertension Brother   . Coronary artery disease Brother     Stent  . Coronary artery disease Brother     Stents  . Hypertension Brother   . Hypertension Sister   . Mitral valve prolapse Sister   . Hypertension Sister   . Thyroid disease Sister   . Cancer Neg Hx      Wt Readings from Last 3 Encounters:  07/10/16 85.5 kg (188 lb 6.4 oz)  03/08/16 84.8 kg (187 lb)  02/26/16 84.4 kg (186 lb)    PHYSICAL EXAM BP 132/70   Pulse 60   Ht 5\' 4"  (1.626 m)   Wt 85.5 kg (188 lb 6.4 oz)   BMI 32.34 kg/m  General appearance: alert, cooperative, appears stated age, no distress and Mildly obese HEENT: [place]/AT, EOMI, MMM, anicteric sclera Neck: no adenopathy, no carotid bruit and no JVD Lungs: clear to auscultation bilaterally, normal percussion bilaterally and non-labored Heart: regular rate and rhythm, S1& S2 normal, no murmur, click, rub or gallop; nondisplaced PMI Abdomen: soft, non-tender; bowel sounds normal; no masses,  no organomegaly; No HJR Extremities:  extremities normal, atraumatic, no cyanosis, and edema - trace Pulses: 2+ and symmetric; No  femoral bruit Skin: mobility and turgor normal and no evidence of bleeding or bruising  Neurologic: Mental status: Alert, oriented, thought content appropriate; Normal Mood & Affect & signs of anxiety Cranial nerves: normal (II-XII grossly intact)    Adult ECG Report  Rate: 60 ;  Rhythm: normal sinus rhythm; Normal axis, durations, & intervals  Narrative Interpretation: Normal EKG   Other studies Reviewed: Additional studies/ records that were reviewed today include:  Recent Labs:   Lab Results  Component Value Date   CHOL 192 09/25/2015   HDL 45.20 09/25/2015   LDLCALC 127 (H) 09/25/2015   LDLDIRECT 154.0 08/14/2012   TRIG 102.0 09/25/2015   CHOLHDL 4 09/25/2015    ASSESSMENT / PLAN: Problem List Items Addressed This Visit    Palpitations    All of these occur in the evening for the most part. This is probably when her morning dose of beta blocker is wearing off. Quite likely related to anxiety and stress. I think if she actually would allow herself to have her dysthymia and anxiety symptoms treated with an antidepressant, this may help. We will change her beta blocker dose time to the evening, but will initially evaluated with a 2 week event monitor. First she will be taking it as currently taking, and then switch after one week to p.m. dosing. We will then determine a, what the cause of palpitations and is any change with adjusting her beta blocker dosing time.      Relevant Orders   EKG 12-Lead   Cardiopulmonary exercise test   Split night study   Cardiac event monitor   OSA (obstructive sleep apnea)    Reportedly described as having mild OSA. But now with her Epworth score of 20, I think we need to reassess with a polysomnogram.      Relevant Orders   Cardiopulmonary exercise test   Split night study   Cardiac event monitor   Obesity, Class I, BMI 30-34.9 (Chronic)    The  patient understands the need to lose weight with diet and exercise. We have discussed specific strategies for this.      Hyperlipidemia with target LDL less than 100 (Chronic)    She has recently started on statin, based on her LDL from January. Has not had another evaluation since that I can see. She is due for a recheck soon.  Would consider statin holiday if cannot figure out another reason for fatigue.      Fatigue due to sleep pattern disturbance (Chronic)    Is difficult tell if her daytime fatigue is related to his sleep pattern disturbance or OSA. But her Epworth score is 20. Recommendation is to continue to use trazodone that was prescribed for her as that seemed to help some. We will also order a polysomnogram to exclude OSA.  She may benefit from sleep medicine evaluation.      Relevant Orders   Cardiopulmonary exercise test   Split night study   Cardiac event monitor   Essential hypertension (Chronic)    Blood pressure is well-controlled. However metoprolol tartrate is a BID medicine that she is only taking it once a day. I am concerned about some of the fatigue potentially be related to beta blocker, but she is also having evening palpitations.  Plan for now is to continue as is, but we'll make adjustments following her event monitor.  Initial plan will be to switch the metoprolol to evening which will hopefully help with the ninth of palpitations and insomnia.  We'll next tried to convert to metoprolol 12.5 twice a day versus Toprol 25 mg..      Relevant Orders   EKG 12-Lead   Cardiopulmonary exercise test   Split night study   Cardiac event monitor   Dyspnea on exertion - Primary    Several months of now worsening exertional dyspnea. This may just be related to her fatigue, but in the setting of rest also having some heart palpitation issues, this is reasonable to evaluate him for potential cardiac ischemia. Plan: Cardiopulmonary Exercise Test - this will give  physiologic dated help interpret whether or not we would then benefit from echocardiogram versus Myoview etc. Can also potentially determine if this chronotropic incompetence related to her using a beta blocker.      Relevant Orders   Cardiopulmonary exercise test   Split night study   Cardiac event monitor    Other Visit Diagnoses   None.     Current medicines are reviewed at length with the patient today. (+/- concerns) n/a The following changes have been made: See below  Patient Instructions  Medication Instructions:  Continue metoprolol as discussed  Labwork: None   Testing/Procedures: Your physician has recommended that you have a cardiopulmonary stress test (CPX). CPX testing is a non-invasive measurement of heart and lung function. It replaces a traditional treadmill stress test. This type of test provides a tremendous amount of information that relates not only to your present condition but also for future outcomes. This test combines measurements of you ventilation, respiratory gas exchange in the lungs, electrocardiogram (EKG), blood pressure and physical response before, during, and following an exercise protocol.  Your physician has recommended that you wear an event monitor for 2 weeks, take Metoprolol in the morning and then take in the evening on the second week of monitoring.  AT [place] OFFICE. Event monitors are medical devices that record the heart's electrical activity. Doctors most often [place] these monitors to diagnose arrhythmias. Arrhythmias are problems with the speed or rhythm of the heartbeat. The monitor is a small, portable device. You can wear one while you do your normal daily activities. This is usually used to diagnose what is causing palpitations/syncope (passing out).  Your physician has recommended that you have a sleep study. This test records several body functions during sleep, including: brain activity, eye movement, oxygen and carbon  dioxide blood levels, heart rate and rhythm, breathing rate and rhythm, the flow of air through your mouth and nose, snoring, body muscle movements, and chest and belly movement.   Follow-Up: Your physician recommends that you schedule a follow-up appointment in: [place]     If you need a refill on your cardiac medications before your next appointment, please call your pharmacy.   Studies Ordered:   Orders Placed This Encounter  Procedures  . Cardiopulmonary exercise test  . Cardiac event monitor  . EKG 12-Lead  . Split night study      Glenetta Hew, M.D., M.S. Interventional Cardiologist   Pager # [phone] Phone # [phone] [address]. [place], [place] [address]

## 2016-07-10 NOTE — Assessment & Plan Note (Signed)
The patient understands the need to lose weight with diet and exercise. We have discussed specific strategies for this.  

## 2016-07-10 NOTE — Assessment & Plan Note (Signed)
She has recently started on statin, based on her LDL from January. Has not had another evaluation since that I can see. She is due for a recheck soon.  Would consider statin holiday if cannot figure out another reason for fatigue.

## 2016-07-10 NOTE — Assessment & Plan Note (Signed)
Several months of now worsening exertional dyspnea. This may just be related to her fatigue, but in the setting of rest also having some heart palpitation issues, this is reasonable to evaluate him for potential cardiac ischemia. Plan: Cardiopulmonary Exercise Test - this will give physiologic dated help interpret whether or not we would then benefit from echocardiogram versus Myoview etc. Can also potentially determine if this chronotropic incompetence related to her using a beta blocker.

## 2016-07-10 NOTE — Assessment & Plan Note (Signed)
All of these occur in the evening for the most part. This is probably when her morning dose of beta blocker is wearing off. Quite likely related to anxiety and stress. I think if she actually would allow herself to have her dysthymia and anxiety symptoms treated with an antidepressant, this may help. We will change her beta blocker dose time to the evening, but will initially evaluated with a 2 week event monitor. First she will be taking it as currently taking, and then switch after one week to p.m. dosing. We will then determine a, what the cause of palpitations and is any change with adjusting her beta blocker dosing time.

## 2016-07-10 NOTE — Assessment & Plan Note (Signed)
Reportedly described as having mild OSA. But now with her Epworth score of 20, I think we need to reassess with a polysomnogram.

## 2016-07-10 NOTE — Assessment & Plan Note (Signed)
Blood pressure is well-controlled. However metoprolol tartrate is a BID medicine that she is only taking it once a day. I am concerned about some of the fatigue potentially be related to beta blocker, but she is also having evening palpitations.  Plan for now is to continue as is, but we'll make adjustments following her event monitor.  Initial plan will be to switch the metoprolol to evening which will hopefully help with the ninth of palpitations and insomnia. We'll next tried to convert to metoprolol 12.5 twice a day versus Toprol 25 mg.Marland Kitchen

## 2016-07-17 ENCOUNTER — Other Ambulatory Visit: Payer: Self-pay | Admitting: Cardiology

## 2016-07-17 ENCOUNTER — Ambulatory Visit (INDEPENDENT_AMBULATORY_CARE_PROVIDER_SITE_OTHER): Payer: Medicare Other

## 2016-07-17 DIAGNOSIS — R002 Palpitations: Secondary | ICD-10-CM | POA: Diagnosis not present

## 2016-07-17 DIAGNOSIS — R0609 Other forms of dyspnea: Secondary | ICD-10-CM

## 2016-07-17 DIAGNOSIS — I10 Essential (primary) hypertension: Secondary | ICD-10-CM

## 2016-07-17 DIAGNOSIS — G4733 Obstructive sleep apnea (adult) (pediatric): Secondary | ICD-10-CM

## 2016-07-17 DIAGNOSIS — R5383 Other fatigue: Secondary | ICD-10-CM

## 2016-07-17 DIAGNOSIS — G479 Sleep disorder, unspecified: Secondary | ICD-10-CM

## 2016-07-29 ENCOUNTER — Other Ambulatory Visit: Payer: Self-pay | Admitting: Family Medicine

## 2016-07-29 DIAGNOSIS — Z1231 Encounter for screening mammogram for malignant neoplasm of breast: Secondary | ICD-10-CM

## 2016-07-30 ENCOUNTER — Encounter (HOSPITAL_COMMUNITY): Payer: Self-pay | Admitting: *Deleted

## 2016-07-30 ENCOUNTER — Ambulatory Visit (HOSPITAL_COMMUNITY): Payer: Medicare Other | Attending: Cardiology

## 2016-07-30 DIAGNOSIS — I10 Essential (primary) hypertension: Secondary | ICD-10-CM

## 2016-07-30 DIAGNOSIS — R0609 Other forms of dyspnea: Secondary | ICD-10-CM | POA: Diagnosis not present

## 2016-07-30 DIAGNOSIS — R5383 Other fatigue: Secondary | ICD-10-CM

## 2016-07-30 DIAGNOSIS — G479 Sleep disorder, unspecified: Secondary | ICD-10-CM

## 2016-07-30 DIAGNOSIS — R002 Palpitations: Secondary | ICD-10-CM

## 2016-07-30 DIAGNOSIS — G4733 Obstructive sleep apnea (adult) (pediatric): Secondary | ICD-10-CM

## 2016-07-31 ENCOUNTER — Encounter: Payer: Self-pay | Admitting: Family Medicine

## 2016-07-31 ENCOUNTER — Ambulatory Visit (INDEPENDENT_AMBULATORY_CARE_PROVIDER_SITE_OTHER): Payer: Medicare Other | Admitting: Family Medicine

## 2016-07-31 ENCOUNTER — Telehealth: Payer: Self-pay

## 2016-07-31 VITALS — BP 130/78 | HR 62 | Temp 98.5°F | Wt 188.0 lb

## 2016-07-31 DIAGNOSIS — R102 Pelvic and perineal pain unspecified side: Secondary | ICD-10-CM | POA: Insufficient documentation

## 2016-07-31 DIAGNOSIS — M546 Pain in thoracic spine: Secondary | ICD-10-CM | POA: Diagnosis not present

## 2016-07-31 DIAGNOSIS — R1011 Right upper quadrant pain: Secondary | ICD-10-CM

## 2016-07-31 DIAGNOSIS — R0609 Other forms of dyspnea: Secondary | ICD-10-CM | POA: Diagnosis not present

## 2016-07-31 LAB — CBC WITH DIFFERENTIAL/PLATELET
BASOS PCT: 0 %
Basophils Absolute: 0 cells/uL (ref 0–200)
EOS ABS: 182 {cells}/uL (ref 15–500)
Eosinophils Relative: 2 %
HEMATOCRIT: 40.8 % (ref 35.0–45.0)
HEMOGLOBIN: 13.7 g/dL (ref 11.7–15.5)
LYMPHS ABS: 2821 {cells}/uL (ref 850–3900)
Lymphocytes Relative: 31 %
MCH: 30.3 pg (ref 27.0–33.0)
MCHC: 33.6 g/dL (ref 32.0–36.0)
MCV: 90.3 fL (ref 80.0–100.0)
MONO ABS: 546 {cells}/uL (ref 200–950)
MPV: 10.7 fL (ref 7.5–12.5)
Monocytes Relative: 6 %
Neutro Abs: 5551 cells/uL (ref 1500–7800)
Neutrophils Relative %: 61 %
Platelets: 257 10*3/uL (ref 140–400)
RBC: 4.52 MIL/uL (ref 3.80–5.10)
RDW: 14 % (ref 11.0–15.0)
WBC: 9.1 10*3/uL (ref 3.8–10.8)

## 2016-07-31 MED ORDER — CYCLOBENZAPRINE HCL 10 MG PO TABS: 5.0000 mg | ORAL_TABLET | Freq: Two times a day (BID) | ORAL | 0 refills | Status: DC | PRN

## 2016-07-31 MED ORDER — HYDROCODONE-ACETAMINOPHEN 5-325 MG PO TABS: 1.0000 | ORAL_TABLET | Freq: Three times a day (TID) | ORAL | 0 refills | Status: DC | PRN

## 2016-07-31 MED ORDER — KETOROLAC TROMETHAMINE 30 MG/ML IJ SOLN
30.0000 mg | Freq: Once | INTRAMUSCULAR | Status: AC
  Administered 2016-07-31: 30 mg via INTRAMUSCULAR

## 2016-07-31 NOTE — Progress Notes (Signed)
Pre visit review using our clinic review tool, if applicable. No additional management support is needed unless otherwise documented below in the visit note. 

## 2016-07-31 NOTE — Progress Notes (Addendum)
BP 130/78 (BP Location: Right Arm, Patient Position: Sitting)   Pulse 62   Temp 98.5 F (36.9 C) (Oral)   Wt 188 lb (85.3 kg)   SpO2 98%   BMI 32.27 kg/m    CC: chest pain Subjective:    Patient ID: Brittany [name], female    DOB: 12/11/1955, 60 y.o.   MRN: [id number]  HPI: Brittany [name] is a 61 y.o. female presenting on 07/31/2016 for Right chest pain (Constant pain radiating around to back, resulting in dyspnea.  sx's X 2 weeks)   2 wk h/o pain below R breast with radiation to mid back, worse with deep breath. Describes constant sharp stabbing pain. Several sick contacts recently. She did have URI sxs prior to pain starting - congestion, rhinorrhea, watery eyes, sneezing and cough. She treated this with pseudophed with improvement of URI sxs. Treating chest pain with motrin, icy hot, heating pad. Worse pain with rolling over in bed. No fevers, wheezing. Not relieved with leaning forward. Pain may have started after moving heavy box of records from closet.   Currently being evaluated by cardiology with stress test yesterday - reviewed without acute findings, holter monitor recently completed for palpitations.   ESS = 20 with cardiology. Sleep study pending.   Relevant past medical, surgical, family and social history reviewed and updated as indicated. Interim medical history since our last visit reviewed. Allergies and medications reviewed and updated. Current Outpatient Prescriptions on File Prior to Visit  Medication Sig  . aspirin (ASPIRIN EC) 81 MG EC tablet Take 81 mg by mouth daily. Swallow whole.  . Cyanocobalamin (VITAMIN B 12 PO) Take by mouth.  . fish oil-omega-3 fatty acids 1000 MG capsule Take 1 g by mouth daily.  Marland Kitchen ibuprofen (ADVIL,MOTRIN) 200 MG tablet Take 600 mg by mouth as needed.   Marland Kitchen levothyroxine (SYNTHROID, LEVOTHROID) 112 MCG tablet Take 1 tablet (112 mcg total) by mouth daily before breakfast.  . losartan (COZAAR) 50 MG tablet Take 1 tablet (50 mg total) by  mouth daily.  . metoprolol tartrate (LOPRESSOR) 25 MG tablet Take 1 tablet (25 mg total) by mouth daily.  . Multiple Vitamins-Minerals (ALIVE WOMENS ENERGY PO) Take 1 tablet by mouth daily.    Marland Kitchen omeprazole (PRILOSEC) 40 MG capsule Take 1 capsule (40 mg total) by mouth daily.  . traZODone (DESYREL) 100 MG tablet Take 1 tablet (100 mg total) by mouth at bedtime.  . Turmeric 500 MG TABS Take 1 tablet by mouth daily.   No current facility-administered medications on file prior to visit.     Review of Systems Per HPI unless specifically indicated in ROS section     Objective:    BP 130/78 (BP Location: Right Arm, Patient Position: Sitting)   Pulse 62   Temp 98.5 F (36.9 C) (Oral)   Wt 188 lb (85.3 kg)   SpO2 98%   BMI 32.27 kg/m   Wt Readings from Last 3 Encounters:  07/31/16 188 lb (85.3 kg)  07/10/16 188 lb 6.4 oz (85.5 kg)  03/08/16 187 lb (84.8 kg)    Physical Exam  Constitutional: She appears well-developed and well-nourished. She appears distressed.  HENT:  Mouth/Throat: Oropharynx is clear and moist. No oropharyngeal exudate.  Cardiovascular: Normal rate, regular rhythm, normal heart sounds and intact distal pulses.   No murmur heard. Pulmonary/Chest: Effort normal and breath sounds normal. No respiratory distress. She has no wheezes. She has no rales. She exhibits tenderness.  Pleuritic chest pain  Abdominal: Soft. Normal  appearance and bowel sounds are normal. She exhibits no distension and no mass. There is no hepatosplenomegaly. There is tenderness (mild) in the right upper quadrant. There is no rigidity, no rebound, no guarding, no CVA tenderness and negative Murphy's sign.  Tenderness radiates inferior to R scapula  Musculoskeletal: She exhibits no edema.  Skin: Skin is warm and dry. No rash noted.  Psychiatric: She has a normal mood and affect.  Nursing note and vitals reviewed.  Results for orders placed or performed in visit on 07/31/16  Comprehensive  metabolic panel  Result Value Ref Range   Sodium 139 135 - 146 mmol/L   Potassium 4.4 3.5 - 5.3 mmol/L   Chloride 104 98 - 110 mmol/L   CO2 26 20 - 31 mmol/L   Glucose, Bld 85 65 - 99 mg/dL   BUN 15 7 - 25 mg/dL   Creat 1.11 (H) 0.50 - 0.99 mg/dL   Total Bilirubin 0.4 0.2 - 1.2 mg/dL   Alkaline Phosphatase 49 33 - 130 U/L   AST 17 10 - 35 U/L   ALT 8 6 - 29 U/L   Total Protein 6.9 6.1 - 8.1 g/dL   Albumin 4.4 3.6 - 5.1 g/dL   Calcium 9.0 8.6 - 10.4 mg/dL  CBC with Differential/Platelet  Result Value Ref Range   WBC 9.1 3.8 - 10.8 K/uL   RBC 4.52 3.80 - 5.10 MIL/uL   Hemoglobin 13.7 11.7 - 15.5 g/dL   HCT 40.8 35.0 - 45.0 %   MCV 90.3 80.0 - 100.0 fL   MCH 30.3 27.0 - 33.0 pg   MCHC 33.6 32.0 - 36.0 g/dL   RDW 14.0 11.0 - 15.0 %   Platelets 257 140 - 400 K/uL   MPV 10.7 7.5 - 12.5 fL   Neutro Abs 5,551 1,500 - 7,800 cells/uL   Lymphs Abs 2,821 850 - 3,900 cells/uL   Monocytes Absolute 546 200 - 950 cells/uL   Eosinophils Absolute 182 15 - 500 cells/uL   Basophils Absolute 0 0 - 200 cells/uL   Neutrophils Relative % 61 %   Lymphocytes Relative 31 %   Monocytes Relative 6 %   Eosinophils Relative 2 %   Basophils Relative 0 %   Smear Review Criteria for review not met   Lipase  Result Value Ref Range   Lipase 25 7 - 60 U/L      Assessment & Plan:   Problem List Items Addressed This Visit    Acute right-sided thoracic back pain - Primary    Reproducible pain to palpation points to MSK cause like rib strain and pulled chest wall muscle, less likely post-viral pleurisy.  Check labs to r/o intraabdominal pathology (CMP, CBC, lipase).  Treat with toradol 34m IM shot today, continue NSAID, add flexeril and vicodin prn breakthrough pain.  Recent reassuring cardiac evaluation.       Relevant Medications   HYDROcodone-acetaminophen (NORCO/VICODIN) 5-325 MG tablet   cyclobenzaprine (FLEXERIL) 10 MG tablet   ketorolac (TORADOL) 30 MG/ML injection 30 mg (Completed)   RUQ  abdominal pain    Pain localizes more to ribcage than liver/gallbladder. Anticipate MSK cause.       Relevant Medications   ketorolac (TORADOL) 30 MG/ML injection 30 mg (Completed)   Other Relevant Orders   Comprehensive metabolic panel (Completed)   CBC with Differential/Platelet (Completed)   Lipase (Completed)       Follow up plan: No Follow-up on file.  JRia Bush MD

## 2016-07-31 NOTE — Patient Instructions (Addendum)
I think this is a muscle strain Labs to help rule out liver/gallbladder. Treat with muscle relaxant, continue ibuprofen, continue ice or heating pad (whichever soothes better), may use vicodin 5/325mg  for breakthrough pain.  If worsening over long weekend, seek care at ER.

## 2016-07-31 NOTE — Telephone Encounter (Signed)
Seen today. 

## 2016-07-31 NOTE — Telephone Encounter (Signed)
Pt walked in; for 2 weeks pt has had pain under rt breast that goes around to mid back; pain level now is 7-9. Hurts all the time but worse when takes deep breath or moves. No distress breathing.  Non prod cough and sneezing started one week ago. No fever or wheezing. No injury. Years ago had lower back pain and thought pulled muscle but when had checked out pt had fx back. Dr Darnell Level said would add pt to his schedule. FYI to Dr Darnell Level.

## 2016-08-01 LAB — COMPREHENSIVE METABOLIC PANEL
ALT: 8 U/L (ref 6–29)
AST: 17 U/L (ref 10–35)
Albumin: 4.4 g/dL (ref 3.6–5.1)
Alkaline Phosphatase: 49 U/L (ref 33–130)
BUN: 15 mg/dL (ref 7–25)
CHLORIDE: 104 mmol/L (ref 98–110)
CO2: 26 mmol/L (ref 20–31)
CREATININE: 1.11 mg/dL — AB (ref 0.50–0.99)
Calcium: 9 mg/dL (ref 8.6–10.4)
Glucose, Bld: 85 mg/dL (ref 65–99)
POTASSIUM: 4.4 mmol/L (ref 3.5–5.3)
SODIUM: 139 mmol/L (ref 135–146)
TOTAL PROTEIN: 6.9 g/dL (ref 6.1–8.1)
Total Bilirubin: 0.4 mg/dL (ref 0.2–1.2)

## 2016-08-01 LAB — LIPASE: Lipase: 25 U/L (ref 7–60)

## 2016-08-03 DIAGNOSIS — R1011 Right upper quadrant pain: Secondary | ICD-10-CM | POA: Insufficient documentation

## 2016-08-03 DIAGNOSIS — M546 Pain in thoracic spine: Secondary | ICD-10-CM | POA: Insufficient documentation

## 2016-08-03 NOTE — Assessment & Plan Note (Addendum)
Reproducible pain to palpation points to MSK cause like rib strain and pulled chest wall muscle, less likely post-viral pleurisy.  Check labs to r/o intraabdominal pathology (CMP, CBC, lipase).  Treat with toradol 30mg  IM shot today, continue NSAID, add flexeril and vicodin prn breakthrough pain.  Recent reassuring cardiac evaluation.

## 2016-08-03 NOTE — Assessment & Plan Note (Signed)
Pain localizes more to ribcage than liver/gallbladder. Anticipate MSK cause.

## 2016-08-06 ENCOUNTER — Other Ambulatory Visit (INDEPENDENT_AMBULATORY_CARE_PROVIDER_SITE_OTHER): Payer: Medicare Other

## 2016-08-06 DIAGNOSIS — E039 Hypothyroidism, unspecified: Secondary | ICD-10-CM | POA: Diagnosis not present

## 2016-08-06 LAB — TSH: TSH: 0.94 u[IU]/mL (ref 0.35–4.50)

## 2016-08-06 LAB — T4, FREE: FREE T4: 1.08 ng/dL (ref 0.60–1.60)

## 2016-08-07 ENCOUNTER — Ambulatory Visit (INDEPENDENT_AMBULATORY_CARE_PROVIDER_SITE_OTHER): Payer: Medicare Other | Admitting: Cardiology

## 2016-08-07 ENCOUNTER — Encounter: Payer: Self-pay | Admitting: Cardiology

## 2016-08-07 VITALS — BP 118/75 | HR 78 | Ht 64.0 in | Wt 188.0 lb

## 2016-08-07 DIAGNOSIS — R5383 Other fatigue: Secondary | ICD-10-CM | POA: Diagnosis not present

## 2016-08-07 DIAGNOSIS — R0609 Other forms of dyspnea: Secondary | ICD-10-CM

## 2016-08-07 DIAGNOSIS — G479 Sleep disorder, unspecified: Secondary | ICD-10-CM

## 2016-08-07 DIAGNOSIS — R002 Palpitations: Secondary | ICD-10-CM | POA: Diagnosis not present

## 2016-08-07 DIAGNOSIS — I10 Essential (primary) hypertension: Secondary | ICD-10-CM | POA: Diagnosis not present

## 2016-08-07 LAB — T3: T3 TOTAL: 100 ng/dL (ref 76–181)

## 2016-08-07 NOTE — Progress Notes (Signed)
PCP: Ria Bush, MD  Clinic Note: Chief Complaint  Patient presents with  . Follow-up  . Shortness of Breath    when walking.   . Back Pain    HPI: Brittany [name] is a 60 y.o. female with a PMH below who presents today for1 month follow-up after testing for palpitations and exertional dyspnea.  [name] is the wife of one of my patients, who has self-referred herself for persistent symptoms. I saw her back on November 1. She noted several complaints including palpitations, poor sleep, exertional dyspnea. He has a history of hypertension, hypothyroidism and probably has some dysthymia/anxiety symptoms. She also has chronic back pain and receives intermittent back injections. For her hypertension, she takes losartan and once daily metoprolol in the morning.  Recent Hospitalizations: None  Studies Reviewed:    CardioPulmonary Exercise Test: Considered to be somewhat submaximal, but showed excellent exercise capacity. Primary limitation was related to the patient's obesity and related restrictive lung physiology from obesity. She did have a hypertensive response to exercise with mildly elevated DE/PCO2 slope possibly suggesting diastolic dysfunction. Recommendation was weight loss and blood pressure control.  Event monitor: Mostly sinus rhythm with some sinus tachycardia. No real PACs and PVCs noted but just to very short 10 and 6 beat runs of PAT.  Interval History: [name] presents today To discuss the results of her tests. We reviewed the party pulmonary test and monitor She still has the fatigue and dyspnea symptoms, but it is actually feeling better and was happy to hear the results of her tests. She is slowly try to get into exercise, but still was somewhat dyspneic response. She has not had any PND orthopnea or any edema. She was happy to hear the results were her monitor and indicated that her palpitation sensation at night notably improved when she switched taking the beta  blocker to nighttime as post morning. She's not had any dizziness, lightheadedness or syncope/near syncope. No TIA or amaurosis fugax. She still has some fatigue issues but has been sleeping a lot better since she took the beta blocker at night. Despite having exertional shortness of breath, she is not noticing any resting or exertional chest tightness or pressure. No PND orthopnea or edema. No claudication.  ROS: A comprehensive was performed. Review of Systems  Constitutional: Positive for malaise/fatigue (Feels tired all the time). Negative for chills, fever and weight loss.  HENT: Negative for congestion and nosebleeds.   Eyes: Negative for blurred vision.  Respiratory: Positive for shortness of breath (With exertion). Negative for cough and wheezing.   Cardiovascular: Positive for palpitations. Negative for chest pain, orthopnea, claudication, leg swelling and PND.  Gastrointestinal: Negative for blood in stool, constipation, heartburn and melena.  Genitourinary: Negative for hematuria.  Musculoskeletal: Negative for falls, joint pain and myalgias.  Neurological: Positive for dizziness and headaches (Occasional). Negative for tingling and loss of consciousness.  Endo/Heme/Allergies: Negative for environmental allergies.  Psychiatric/Behavioral: Negative for depression (She denies having a depressed mood, but does describe some dysthymia symptoms), memory loss and substance abuse. The patient is nervous/anxious (She is always worried. Always keeping an eye out for her husband. Was always this way with her children as well. She tends to pick her fingernails.) and has insomnia.   All other systems reviewed and are negative.   Past Medical History:  Diagnosis Date  . Arthritis    neck pain and numbness left digits Vertell Limber)  . Chronic lower back pain    persistent after L1 cmp  fx, with multilevel mild lumbar DDD - good resolution after L L5/S1 ESI ([place])  . Colon polyps    last  colonoscopy 2006? rpt due  . Diverticulitis   . Fatty liver 02/2012   on CT scan 02/2012  . GERD (gastroesophageal reflux disease)   . History of diverticulitis of colon    s/p resection of large intestine, rpt itis 02/2012  . HTN (hypertension)   . Hypothyroid   . Migraines    occasional, stress related  . OSA (obstructive sleep apnea)    mild, no CPAP  . Postmenopausal    HRT compound, previously premarin  . Seasonal allergies   . Vertebral compression fracture ([place]) 07/2012   L1 25% Vertell Limber) released without intervention    Past Surgical History:  Procedure Laterality Date  . ANTERIOR FUSION CERVICAL SPINE  01/1999   cervical HNP  . COLON RESECTION  2008   2 ft removed, diverticulitis  . COLONOSCOPY  03/2012   partial colectomy, scattered diverticulae [name], rpt 10 yrs  . ESI Left 06/2013, 12/2013, 09/2014   L5/S1 with good resolution of pain  . ESOPHAGOGASTRODUODENOSCOPY  03/2012   mod gastritis, benign polyps, H pylori neg  . FOOT SURGERY Left 01/2014   big toe fusion  . KNEE SURGERY  1995, 2011   torn menisci (MRI 2010 L)  . SPIROMETRY  2011   normal  . TONSILLECTOMY  1964  . TOTAL ABDOMINAL HYSTERECTOMY W/ BILATERAL SALPINGOOPHORECTOMY  06/2009   vag bleeding  . US ECHOCARDIOGRAPHY  08/2010   normal, EF 63%, mild valvular issues     Prior to Admission medications   Medication Sig Start Date End Date Taking? Authorizing Provider  aspirin (ASPIRIN EC) 81 MG EC tablet Take 81 mg by mouth daily. Swallow whole.   Yes Historical Provider, MD  Cyanocobalamin (VITAMIN B 12 PO) Take by mouth.   Yes Historical Provider, MD  fish oil-omega-3 fatty acids 1000 MG capsule Take 1 g by mouth daily.   Yes Historical Provider, MD  ibuprofen (ADVIL,MOTRIN) 200 MG tablet Take 600 mg by mouth as needed.    Yes Historical Provider, MD  levothyroxine (SYNTHROID, LEVOTHROID) 112 MCG tablet Take 1 tablet (112 mcg total) by mouth daily before breakfast. 05/05/16  Yes Ria Bush, MD    losartan (COZAAR) 50 MG tablet Take 1 tablet (50 mg total) by mouth daily. 09/29/15  Yes Ria Bush, MD  metoprolol tartrate (LOPRESSOR) 25 MG tablet Take 1 tablet (25 mg total) by mouth daily. 09/29/15  Yes Ria Bush, MD  Multiple Vitamins-Minerals (ALIVE WOMENS ENERGY PO) Take 1 tablet by mouth daily.     Yes Historical Provider, MD  omeprazole (PRILOSEC) 40 MG capsule Take 1 capsule (40 mg total) by mouth daily. 09/29/15  Yes Ria Bush, MD  traZODone (DESYREL) 100 MG tablet Take 1 tablet (100 mg total) by mouth at bedtime. 02/26/16  Yes Myra Marijo Sanes, MD  Turmeric 500 MG TABS Take 1 tablet by mouth daily.   Yes Historical Provider, MD   Allergies  Allergen Reactions  . Cymbalta [Duloxetine Hcl] Other (See Comments)    suicidality  . Percocet [Oxycodone-Acetaminophen] Other (See Comments)    Nauseated, dizzy     Social History   Social History  . Marital status: Married    Spouse name: N/A  . Number of children: 2  . Years of education: HS   Occupational History  .  Not Employed   Social History Main Topics  . Smoking status:  Never Smoker  . Smokeless tobacco: Never Used  . Alcohol use Yes     Comment: rare  . Drug use: No  . Sexual activity: Yes    Birth control/ protection: Surgical   Other Topics Concern  . None   Social History Narrative   Caffeine: 2-3 cups/day (1-2 coffee); occasional beer   Lives with husband and 22yo son, 4 dogs   Activity: no scheduled - enjoys walking, but just usually run around doing shopping etc. Does not do routine exercise.   Diet: lots of water, fruits/vegetable   Family History  Problem Relation Age of Onset  . Coronary artery disease Mother 78    CABG and valve replacement  . Arthritis Mother   . Valvular heart disease Mother     Had valve replacement  . Coronary artery disease Father 54    CAD/MI  . Hypertension Father   . Heart disease Father   . Diabetes Sister   . Hypertension Sister   . Hypertension  Brother   . Coronary artery disease Brother     Stent  . Coronary artery disease Brother     Stents  . Hypertension Brother   . Hypertension Sister   . Mitral valve prolapse Sister   . Hypertension Sister   . Thyroid disease Sister   . Cancer Neg Hx      Wt Readings from Last 3 Encounters:  08/07/16 85.3 kg (188 lb)  07/31/16 85.3 kg (188 lb)  07/10/16 85.5 kg (188 lb 6.4 oz)    PHYSICAL EXAM BP 118/75   Pulse 78   Ht 5\' 4"  (1.626 m)   Wt 85.3 kg (188 lb)   BMI 32.27 kg/m  General appearance: alert, cooperative, appears stated age, no distress and Mildly obese HEENT: [place]/AT, EOMI, MMM, anicteric sclera Neck: no adenopathy, no carotid bruit and no JVD Lungs: clear to auscultation bilaterally, normal percussion bilaterally and non-labored Heart: regular rate and rhythm, S1& S2 normal, no murmur, click, rub or gallop; nondisplaced PMI Abdomen: soft, non-tender; bowel sounds normal; no masses,  no organomegaly; No HJR Extremities: extremities normal, atraumatic, no cyanosis, and edema - trace Pulses: 2+ and symmetric; No femoral bruit Skin: mobility and turgor normal and no evidence of bleeding or bruising  Neurologic: Mental status: Alert, oriented, thought content appropriate; Normal Mood & Affect & signs of anxiety    Adult ECG Report Not checked   Other studies Reviewed: Additional studies/ records that were reviewed today include:  Recent Labs:  No recent labs  ASSESSMENT / PLAN: Problem List Items Addressed This Visit    Essential hypertension (Chronic)    Well controlled - doing well with Toprol at night. On stable dose of losartan.      Palpitations (Chronic)    2 fleeting episodes of PAT noted otherwise no arrhythmias or PACs PVCs. Her symptoms notably improved when she switched taking Toprol to the evening. We will could discontinue with this current treatment.      Fatigue due to sleep pattern disturbance (Chronic)    Polysomnogram ordered based upon  Epworth Score of 20. Pending.  May benefit from Sleep Medicine evaluation.      Dyspnea on exertion (Chronic)    She was very relieved to you the results of her CPET test -- happy to hear LOW RISK for CAD result. Recommendation is weight loss & exercise. -- discussed dietary modification & exercise programs.  Hypertensive response to exercise often is indicative of deconditioning - most likely the  current situation.         Current medicines are reviewed at length with the patient today. (+/- concerns) n/a The following changes have been made: See below  Patient Instructions  Continue with taking beta blocker( METOPROLOL TARTRATE) at night time   Your physician discussed the importance of regular exercise and recommended that you start or continue a regular exercise program for good health.   GOOD NEWS WITH TEST.   Your physician wants you to follow-up in: [place]- 30 MIN You will receive a reminder letter in the mail two months in advance. If you don't receive a letter, please call our office to schedule the follow-up appointment.   If you need a refill on your cardiac medications before your next appointment, please call your pharmacy.   Studies Ordered:   No orders of the defined types were placed in this encounter.     Glenetta Hew, M.D., M.S. Interventional Cardiologist   Pager # [phone] Phone # [phone] [address] [place], [place] [address]  Event Monitor Report Summary: The patient's monitoring period was 07/17/2016 - 07/30/2016. Baseline sample showed Sinus Rhythm with a heart rate of 60.6 bpm. There were 0 critical, 0 serious, and 12 stable events that occurred. The report analysis of the critical, serious, stable and manually triggered events are listed below. Manually Detected Events: 2 Stable: Sinus Rhythm . Chest Pain or Pressure 4 Stable: Sinus Rhythm . Flutter or Skipped Beats 1 Stable: Sinus Rhythm .  Rapid or Fast Heartbeat 1 Stable: Sinus Rhythm w/Atrial Run . Flutter or Skipped Beats 2 Stable: Sinus Rhythm . Shortness of Breath 1 Stable: Sinus Tachycardia, Sinus Rhythm . Shortness of Breath Automatically Detected Events: 1 Stable: Sinus Rhythm End of summarized findings. Physician Comments: Summarized Findings Copyright

## 2016-08-07 NOTE — Patient Instructions (Addendum)
Continue with taking beta blocker( METOPROLOL TARTRATE) at night time   Your physician discussed the importance of regular exercise and recommended that you start or continue a regular exercise program for good health.   GOOD NEWS WITH TEST.   Your physician wants you to follow-up in: [place]- 30 MIN You will receive a reminder letter in the mail two months in advance. If you don't receive a letter, please call our office to schedule the follow-up appointment.   If you need a refill on your cardiac medications before your next appointment, please call your pharmacy.

## 2016-08-09 ENCOUNTER — Encounter: Payer: Self-pay | Admitting: Family Medicine

## 2016-08-09 ENCOUNTER — Encounter: Payer: Self-pay | Admitting: Cardiology

## 2016-08-09 ENCOUNTER — Ambulatory Visit (INDEPENDENT_AMBULATORY_CARE_PROVIDER_SITE_OTHER): Payer: Medicare Other | Admitting: Family Medicine

## 2016-08-09 VITALS — BP 124/70 | HR 77 | Temp 98.2°F | Wt 186.5 lb

## 2016-08-09 DIAGNOSIS — J069 Acute upper respiratory infection, unspecified: Secondary | ICD-10-CM | POA: Diagnosis not present

## 2016-08-09 MED ORDER — GUAIFENESIN-CODEINE 100-10 MG/5ML PO SYRP: 5.0000 mL | ORAL_SOLUTION | Freq: Three times a day (TID) | ORAL | 0 refills | Status: DC | PRN

## 2016-08-09 NOTE — Progress Notes (Signed)
Subjective:    Patient ID: Brittany [name], female    DOB: 08-01-1956, 60 y.o.   MRN: [id number]  HPI This is a 60 yo female who presents today with cough and nasal congestion for 4 days. Started with sneezing, stuffy nose. Ears with pressure and sore throat. No relief with sudafed, Mucinex D, Nyquil. Nasal drainage started clear then had gotten green and brown. No facial pain, has headache at top of head. Has lost voice. No wheezing, some SOB with activity. Cough more wet with lying down. No history of seasonal allergies. No fever.   Past Medical History:  Diagnosis Date  . Arthritis    neck pain and numbness left digits Vertell Limber)  . Chronic lower back pain    persistent after L1 cmp fx, with multilevel mild lumbar DDD - good resolution after L L5/S1 ESI ([facility])  . Colon polyps    last colonoscopy 2006? rpt due  . Diverticulitis   . Fatty liver 02/2012   on CT scan 02/2012  . GERD (gastroesophageal reflux disease)   . History of diverticulitis of colon    s/p resection of large intestine, rpt itis 02/2012  . HTN (hypertension)   . Hypothyroid   . Migraines    occasional, stress related  . OSA (obstructive sleep apnea)    mild, no CPAP  . Postmenopausal    HRT compound, previously premarin  . Seasonal allergies   . Vertebral compression fracture ([place]) 07/2012   L1 25% Vertell Limber) released without intervention   Past Surgical History:  Procedure Laterality Date  . ANTERIOR FUSION CERVICAL SPINE  01/1999   cervical HNP  . COLON RESECTION  2008   2 ft removed, diverticulitis  . COLONOSCOPY  03/2012   partial colectomy, scattered diverticulae [name], rpt 10 yrs  . ESI Left 06/2013, 12/2013, 09/2014   L5/S1 with good resolution of pain  . ESOPHAGOGASTRODUODENOSCOPY  03/2012   mod gastritis, benign polyps, H pylori neg  . FOOT SURGERY Left 01/2014   big toe fusion  . KNEE SURGERY  1995, 2011   torn menisci (MRI 2010 L)  . SPIROMETRY  2011   normal  . TONSILLECTOMY  1964  . TOTAL  ABDOMINAL HYSTERECTOMY W/ BILATERAL SALPINGOOPHORECTOMY  06/2009   vag bleeding  . US ECHOCARDIOGRAPHY  08/2010   normal, EF 63%, mild valvular issues   Family History  Problem Relation Age of Onset  . Coronary artery disease Mother 23    CABG and valve replacement  . Arthritis Mother   . Valvular heart disease Mother     Had valve replacement  . Coronary artery disease Father 65    CAD/MI  . Hypertension Father   . Heart disease Father   . Diabetes Sister   . Hypertension Sister   . Hypertension Brother   . Coronary artery disease Brother     Stent  . Coronary artery disease Brother     Stents  . Hypertension Brother   . Hypertension Sister   . Mitral valve prolapse Sister   . Hypertension Sister   . Thyroid disease Sister   . Cancer Neg Hx    Social History  Substance Use Topics  . Smoking status: Never Smoker  . Smokeless tobacco: Never Used  . Alcohol use Yes     Comment: rare      Review of Systems Per HPI    Objective:   Physical Exam  Constitutional: She is oriented to person, place, and time. She appears well-developed  and well-nourished.  HENT:  Head: Normocephalic and atraumatic.  Right Ear: Tympanic membrane, external ear and ear canal normal.  Left Ear: Tympanic membrane, external ear and ear canal normal.  Nose: Mucosal edema and rhinorrhea present. Right sinus exhibits no maxillary sinus tenderness and no frontal sinus tenderness. Left sinus exhibits no maxillary sinus tenderness and no frontal sinus tenderness.  Mouth/Throat: Uvula is midline, oropharynx is clear and moist and mucous membranes are normal.  Eyes: Conjunctivae are normal.  Neck: Normal range of motion. Neck supple.  Cardiovascular: Normal rate, regular rhythm and normal heart sounds.   Pulmonary/Chest: Effort normal and breath sounds normal.  Lymphadenopathy:    She has no cervical adenopathy.  Neurological: She is alert and oriented to person, place, and time.  Skin: Skin is  warm and dry.  Psychiatric: She has a normal mood and affect. Her behavior is normal. Judgment and thought content normal.  Vitals reviewed.     BP 124/70   Pulse 77   Temp 98.2 F (36.8 C) (Oral)   Wt 186 lb 8 oz (84.6 kg)   SpO2 97%   BMI 32.01 kg/m  Wt Readings from Last 3 Encounters:  08/09/16 186 lb 8 oz (84.6 kg)  08/07/16 188 lb (85.3 kg)  07/31/16 188 lb (85.3 kg)       Assessment & Plan:  1. Upper respiratory infection with cough and congestion - Provided written and verbal information regarding diagnosis and treatment. - advised her to take either Mucinex D or Sudafed as both contain pseudoephedrine, can add saline nasal spray, Afrin for maximum of 4 day, adequate hydration and rest - RTC precautions reviewed  - guaiFENesin-codeine (CHERATUSSIN AC) 100-10 MG/5ML syrup; Take 5 mLs by mouth 3 (three) times daily as needed for cough.  Dispense: 120 mL; Refill: 0   Clarene Reamer, FNP-BC  [facility], [facility]  08/09/2016 2:29 PM

## 2016-08-09 NOTE — Patient Instructions (Addendum)
For nasal congestion you can use Afrin nasal spray for 4 days max, Sudafed, saline nasal spray- 4-6 times per day (generic is fine for all). Can add an otc antihistamine like Zyrtec. Drink enough fluids to make your urine light yellow. For fever/chill/muscle aches you can take over the counter acetaminophen or ibuprofen.  Please come back in if you are not better in 5-7 days or if you develop wheezing, shortness of breath or persistent vomiting.   Upper Respiratory Infection, Adult Most upper respiratory infections (URIs) are a viral infection of the air passages leading to the lungs. A URI affects the nose, throat, and upper air passages. The most common type of URI is nasopharyngitis and is typically referred to as "the common cold." URIs run their course and usually go away on their own. Most of the time, a URI does not require medical attention, but sometimes a bacterial infection in the upper airways can follow a viral infection. This is called a secondary infection. Sinus and middle ear infections are common types of secondary upper respiratory infections. Bacterial pneumonia can also complicate a URI. A URI can worsen asthma and chronic obstructive pulmonary disease (COPD). Sometimes, these complications can require emergency medical care and may be life threatening. What are the causes? Almost all URIs are caused by viruses. A virus is a type of germ and can spread from one person to another. What increases the risk? You may be at risk for a URI if:  You smoke.  You have chronic heart or lung disease.  You have a weakened defense (immune) system.  You are very young or very old.  You have nasal allergies or asthma.  You work in crowded or poorly ventilated areas.  You work in health care facilities or schools. What are the signs or symptoms? Symptoms typically develop 2-3 days after you come in contact with a cold virus. Most viral URIs last 7-10 days. However, viral URIs from  the influenza virus (flu virus) can last 14-18 days and are typically more severe. Symptoms may include:  Runny or stuffy (congested) nose.  Sneezing.  Cough.  Sore throat.  Headache.  Fatigue.  Fever.  Loss of appetite.  Pain in your forehead, behind your eyes, and over your cheekbones (sinus pain).  Muscle aches. How is this diagnosed? Your health care provider may diagnose a URI by:  Physical exam.  Tests to check that your symptoms are not due to another condition such as:  Strep throat.  Sinusitis.  Pneumonia.  Asthma. How is this treated? A URI goes away on its own with time. It cannot be cured with medicines, but medicines may be prescribed or recommended to relieve symptoms. Medicines may help:  Reduce your fever.  Reduce your cough.  Relieve nasal congestion. Follow these instructions at home:  Take medicines only as directed by your health care provider.  Gargle warm saltwater or take cough drops to comfort your throat as directed by your health care provider.  Use a warm mist humidifier or inhale steam from a shower to increase air moisture. This may make it easier to breathe.  Drink enough fluid to keep your urine clear or pale yellow.  Eat soups and other clear broths and maintain good nutrition.  Rest as needed.  Return to work when your temperature has returned to normal or as your health care provider advises. You may need to stay home longer to avoid infecting others. You can also use a face mask and careful  hand washing to prevent spread of the virus.  Increase the usage of your inhaler if you have asthma.  Do not use any tobacco products, including cigarettes, chewing tobacco, or electronic cigarettes. If you need help quitting, ask your health care provider. How is this prevented? The best way to protect yourself from getting a cold is to practice good hygiene.  Avoid oral or hand contact with people with cold symptoms.  Wash your  hands often if contact occurs. There is no clear evidence that vitamin C, vitamin E, echinacea, or exercise reduces the chance of developing a cold. However, it is always recommended to get plenty of rest, exercise, and practice good nutrition. Contact a health care provider if:  You are getting worse rather than better.  Your symptoms are not controlled by medicine.  You have chills.  You have worsening shortness of breath.  You have brown or red mucus.  You have yellow or brown nasal discharge.  You have pain in your face, especially when you bend forward.  You have a fever.  You have swollen neck glands.  You have pain while swallowing.  You have white areas in the back of your throat. Get help right away if:  You have severe or persistent:  Headache.  Ear pain.  Sinus pain.  Chest pain.  You have chronic lung disease and any of the following:  Wheezing.  Prolonged cough.  Coughing up blood.  A change in your usual mucus.  You have a stiff neck.  You have changes in your:  Vision.  Hearing.  Thinking.  Mood. This information is not intended to replace advice given to you by your health care provider. Make sure you discuss any questions you have with your health care provider. Document Released: 02/19/2001 Document Revised: 04/28/2016 Document Reviewed: 12/01/2013 Elsevier Interactive Patient Education  2017 [employer].

## 2016-08-09 NOTE — Assessment & Plan Note (Signed)
She was very relieved to you the results of her CPET test -- happy to hear LOW RISK for CAD result. Recommendation is weight loss & exercise. -- discussed dietary modification & exercise programs.  Hypertensive response to exercise often is indicative of deconditioning - most likely the current situation.

## 2016-08-09 NOTE — Assessment & Plan Note (Signed)
Well controlled - doing well with Toprol at night. On stable dose of losartan.

## 2016-08-09 NOTE — Assessment & Plan Note (Addendum)
2 fleeting episodes of PAT noted otherwise no arrhythmias or PACs PVCs. Her symptoms notably improved when she switched taking Toprol to the evening. We will could discontinue with this current treatment.

## 2016-08-09 NOTE — Assessment & Plan Note (Signed)
Polysomnogram ordered based upon Epworth Score of 20. Pending.  May benefit from Sleep Medicine evaluation.

## 2016-08-10 ENCOUNTER — Encounter: Payer: Self-pay | Admitting: Family Medicine

## 2016-08-28 ENCOUNTER — Ambulatory Visit (HOSPITAL_BASED_OUTPATIENT_CLINIC_OR_DEPARTMENT_OTHER): Payer: Medicare Other | Attending: Cardiology | Admitting: Cardiovascular Disease

## 2016-08-28 DIAGNOSIS — I10 Essential (primary) hypertension: Secondary | ICD-10-CM

## 2016-08-28 DIAGNOSIS — G4733 Obstructive sleep apnea (adult) (pediatric): Secondary | ICD-10-CM | POA: Diagnosis not present

## 2016-08-28 DIAGNOSIS — R0609 Other forms of dyspnea: Secondary | ICD-10-CM

## 2016-08-28 DIAGNOSIS — R5383 Other fatigue: Secondary | ICD-10-CM | POA: Insufficient documentation

## 2016-08-28 DIAGNOSIS — G479 Sleep disorder, unspecified: Secondary | ICD-10-CM

## 2016-08-28 DIAGNOSIS — R0600 Dyspnea, unspecified: Secondary | ICD-10-CM | POA: Insufficient documentation

## 2016-08-28 DIAGNOSIS — R002 Palpitations: Secondary | ICD-10-CM | POA: Diagnosis not present

## 2016-09-06 ENCOUNTER — Ambulatory Visit
Admission: RE | Admit: 2016-09-06 | Discharge: 2016-09-06 | Disposition: A | Discharge status: Home / Self Care | Payer: Medicare Other | Source: Ambulatory Visit | Attending: Family Medicine | Admitting: Family Medicine

## 2016-09-06 DIAGNOSIS — Z1231 Encounter for screening mammogram for malignant neoplasm of breast: Secondary | ICD-10-CM

## 2016-09-06 LAB — HM MAMMOGRAPHY

## 2016-09-09 DIAGNOSIS — G4733 Obstructive sleep apnea (adult) (pediatric): Secondary | ICD-10-CM

## 2016-09-09 HISTORY — DX: Obstructive sleep apnea (adult) (pediatric): G47.33

## 2016-09-11 ENCOUNTER — Encounter: Payer: Self-pay | Admitting: *Deleted

## 2016-09-14 NOTE — Procedures (Signed)
Patient Name: Brittany [name], Brittany [name] Date: 08/28/2016 Gender: Female D.O.B: [birth year] Age (years): 60 Referring Provider: Glenetta Hew Height (inches): 62 Interpreting Physician: Shelva Majestic MD, ABSM Weight (lbs): 184 RPSGT: Carolin Coy BMI: 32 MRN: [id number] Neck Size: 15.00  CLINICAL INFORMATION Sleep Study Type: NPSG  Indication for sleep study: Excessive Daytime Sleepiness, Fatigue, Hypertension, OSA, Snoring  Epworth Sleepiness Score: 19 consistent with very significant excessive daytime sleepiness.  SLEEP STUDY TECHNIQUE As per the AASM Manual for the Scoring of Sleep and Associated Events v2.3 (April 2016) with a hypopnea requiring 4% desaturations.  The channels recorded and monitored were frontal, central and occipital EEG, electrooculogram (EOG), submentalis EMG (chin), nasal and oral airflow, thoracic and abdominal wall motion, anterior tibialis EMG, snore microphone, electrocardiogram, and pulse oximetry.  MEDICATIONS Medications    aspirin (ASPIRIN EC) 81 MG EC tablet    Cyanocobalamin (VITAMIN B 12 PO)    cyclobenzaprine (FLEXERIL) 10 MG tablet    fish oil-omega-3 fatty acids 1000 MG capsule    guaiFENesin-codeine (CHERATUSSIN AC) 100-10 MG/5ML syrup    HYDROcodone-acetaminophen (NORCO/VICODIN) 5-325 MG tablet    ibuprofen (ADVIL,MOTRIN) 200 MG tablet    levothyroxine (SYNTHROID, LEVOTHROID) 112 MCG tablet    losartan (COZAAR) 50 MG tablet    metoprolol tartrate (LOPRESSOR) 25 MG tablet    Multiple Vitamins-Minerals (ALIVE WOMENS ENERGY PO)    omeprazole (PRILOSEC) 40 MG capsule    traZODone (DESYREL) 100 MG tablet     Medications self-administered by patient taken the night of the study : TRAZODONE  SLEEP ARCHITECTURE The study was initiated at 10:09:56 PM and ended at 5:14:01 AM.  Sleep onset time was 81.7 minutes and the sleep efficiency was 52.1%. The total sleep time was 221.0 minutes. Wake after sleep onset (WASO) was 121.4  minutes.  Stage REM latency was 116.0 minutes.  The patient spent 17.87% of the night in stage N1 sleep, 64.71% in stage N2 sleep, 0.00% in stage N3 and 17.42% in REM.  Alpha intrusion was absent.  Supine sleep was 0.00%.  RESPIRATORY PARAMETERS The overall apnea/hypopnea index (AHI) was 4.3 per hour and RDI was 6.0 per hour. There were 3 total apneas, including 1 obstructive, 2 central and 0 mixed apneas. There were 13 hypopneas and 6 RERAs.  The AHI during Stage REM sleep was 24.9 per hour.  AHI while supine was N/A per hour.  The mean oxygen saturation was 94.10%. The minimum SpO2 during sleep was 86.00%.  Moderate snoring was noted during this study.  CARDIAC DATA The 2 lead EKG demonstrated sinus rhythm. The mean heart rate was 59.50 beats per minute. Other EKG findings include: PVCs.  LEG MOVEMENT DATA The total PLMS were 4 with a resulting PLMS index of 1.09. Associated arousal with leg movement index was 0.3 .  IMPRESSIONS - Increased upper airway resistance/borderline sleep apnea overall (AHI = 4.3/h; RDI 6.9/h); however, there was moderate sleep apnea during REM sleep. (AHI 24.9/h).  Supine sleep did not occur during this study. - No significant central sleep apnea occurred during this study (CAI = 0.5/h). - Mild oxygen desaturation to a nadir of 86.00%. - The arousal index was abnormal. - The patient snored with Moderate snoring volume. - EKG findings include PVCs. - Clinically significant periodic limb movements did not occur during sleep. No significant associated arousals.  DIAGNOSIS - obstructive sleep apnea  RECOMMENDATIONS - The patient has borderline sleep apnea overall but moderate sleep apnea with REM sleep. Since supine sleep was not present,  the overall AHI may be underestimated if supine sleep had occurred. She has significant daytime sleepiness with an Epworth score of 19.  Patient can initially consider alternatives to CPAP such as customized oral  appliance and evaluation for moderate snoring, but  patient will benefit from CPAP therapy and a CPAP trial can be scheduled for further evaluation and treatment.  - Avoid alcohol, sedatives and other CNS depressants that may worsen sleep apnea and disrupt normal sleep architecture. - Sleep hygiene should be reviewed to assess factors that may improve sleep quality. - Weight management (BMI 32) and regular exercise should be initiated or continued if appropriate.  [Electronically signed] 09/14/2016 01:10 PM  Shelva Majestic MD, [facility], [place], American Board of Sleep Medicine  NPI: [id number] [place] PH: [phone]   FX: [phone] [place]

## 2016-09-18 ENCOUNTER — Encounter: Payer: Self-pay | Admitting: Family Medicine

## 2016-09-23 ENCOUNTER — Encounter: Payer: Self-pay | Admitting: Family Medicine

## 2016-09-24 ENCOUNTER — Encounter (HOSPITAL_COMMUNITY): Payer: Self-pay | Admitting: Emergency Medicine

## 2016-09-24 ENCOUNTER — Ambulatory Visit (HOSPITAL_COMMUNITY)
Admission: EM | Admit: 2016-09-24 | Discharge: 2016-09-24 | Disposition: A | Discharge status: Home / Self Care | Payer: Medicare Other | Attending: Family Medicine | Admitting: Family Medicine

## 2016-09-24 ENCOUNTER — Ambulatory Visit (INDEPENDENT_AMBULATORY_CARE_PROVIDER_SITE_OTHER): Payer: Medicare Other

## 2016-09-24 DIAGNOSIS — J00 Acute nasopharyngitis [common cold]: Secondary | ICD-10-CM

## 2016-09-24 DIAGNOSIS — R0989 Other specified symptoms and signs involving the circulatory and respiratory systems: Secondary | ICD-10-CM | POA: Diagnosis not present

## 2016-09-24 MED ORDER — IPRATROPIUM BROMIDE 0.06 % NA SOLN: 2.0000 | Freq: Four times a day (QID) | NASAL | 1 refills | Status: DC

## 2016-09-24 NOTE — Discharge Instructions (Signed)
Use humidifier and netti pot rinse as much as possible,use medicine as prescribed, see your doctor as needed.

## 2016-09-24 NOTE — ED Provider Notes (Signed)
[place]    CSN: [id number] Arrival date & time: 09/24/16  1159     History   Chief Complaint Chief Complaint  Patient presents with  . Facial Pain    HPI Brittany [name] is a 61 y.o. female.   The history is provided by the patient and the spouse.  URI  Presenting symptoms: congestion, ear pain, facial pain, fever and rhinorrhea   Presenting symptoms: no sore throat   Severity:  Moderate Onset quality:  Gradual Duration:  1 month Progression:  Worsening Chronicity:  New Ineffective treatments:  OTC medications Associated symptoms: sinus pain     Past Medical History:  Diagnosis Date  . Arthritis    neck pain and numbness left digits Vertell Limber)  . Chronic lower back pain    persistent after L1 cmp fx, with multilevel mild lumbar DDD - good resolution after L L5/S1 ESI ([place])  . Colon polyps    last colonoscopy 2006? rpt due  . Diverticulitis   . Fatty liver 02/2012   on CT scan 02/2012  . GERD (gastroesophageal reflux disease)   . History of diverticulitis of colon    s/p resection of large intestine, rpt itis 02/2012  . HTN (hypertension)   . Hypothyroid   . Migraines    occasional, stress related  . OSA (obstructive sleep apnea) 09/2016   mild to moderate, consider CPAP  . Postmenopausal    HRT compound, previously premarin  . Seasonal allergies   . Vertebral compression fracture ([place]) 07/2012   L1 25% Vertell Limber) released without intervention    Patient Active Problem List   Diagnosis Date Noted  . Acute right-sided thoracic back pain 08/03/2016  . RUQ abdominal pain 08/03/2016  . Palpitations 07/10/2016  . Fatigue due to sleep pattern disturbance 07/10/2016  . Dyspnea on exertion 07/10/2016  . Advanced care planning/counseling discussion 09/29/2015  . Intertrigo 03/08/2014  . Insomnia 03/08/2014  . Obesity, Class I, BMI 30-34.9 07/21/2013  . Sacroiliitis ([place]) 04/26/2013  . Compression fracture of L1 lumbar vertebra ([place]) 08/11/2012  .  Diverticulitis 03/02/2012  . Colon polyp 09/26/2011  . Medicare annual wellness visit, subsequent 09/26/2011  . Hearing loss 09/26/2011  . Left knee pain 09/16/2011  . Cervical spondylosis with myelopathy 09/16/2011  . Hyperlipidemia with target LDL less than 100 06/03/2011  . Hot flashes 04/29/2011  . Depression   . GERD (gastroesophageal reflux disease)   . Essential hypertension   . Migraines   . OSA (obstructive sleep apnea)   . Hypothyroidism     Past Surgical History:  Procedure Laterality Date  . ANTERIOR FUSION CERVICAL SPINE  01/1999   cervical HNP  . COLON RESECTION  2008   2 ft removed, diverticulitis  . COLONOSCOPY  03/2012   partial colectomy, scattered diverticulae [name], rpt 10 yrs  . ESI Left 06/2013, 12/2013, 09/2014   L5/S1 with good resolution of pain  . ESOPHAGOGASTRODUODENOSCOPY  03/2012   mod gastritis, benign polyps, H pylori neg  . FOOT SURGERY Left 01/2014   big toe fusion  . KNEE SURGERY  1995, 2011   torn menisci (MRI 2010 L)  . SPIROMETRY  2011   normal  . TONSILLECTOMY  1964  . TOTAL ABDOMINAL HYSTERECTOMY W/ BILATERAL SALPINGOOPHORECTOMY  06/2009   vag bleeding  . US ECHOCARDIOGRAPHY  08/2010   normal, EF 63%, mild valvular issues    OB History    Gravida Para Term Preterm AB Living   2 2 2  2   SAB TAB Ectopic Multiple Live Births           2       Home Medications    Prior to Admission medications   Medication Sig Start Date End Date Taking? Authorizing Provider  aspirin (ASPIRIN EC) 81 MG EC tablet Take 81 mg by mouth daily. Swallow whole.   Yes Historical Provider, MD  Cyanocobalamin (VITAMIN B 12 PO) Take by mouth.   Yes Historical Provider, MD  fish oil-omega-3 fatty acids 1000 MG capsule Take 1 g by mouth daily.   Yes Historical Provider, MD  ibuprofen (ADVIL,MOTRIN) 200 MG tablet Take 600 mg by mouth as needed.    Yes Historical Provider, MD  levothyroxine (SYNTHROID, LEVOTHROID) 112 MCG tablet Take 1 tablet (112 mcg  total) by mouth daily before breakfast. 05/05/16  Yes Ria Bush, MD  losartan (COZAAR) 50 MG tablet Take 1 tablet (50 mg total) by mouth daily. 09/29/15  Yes Ria Bush, MD  metoprolol tartrate (LOPRESSOR) 25 MG tablet Take 1 tablet (25 mg total) by mouth daily. 09/29/15  Yes Ria Bush, MD  Multiple Vitamins-Minerals (ALIVE WOMENS ENERGY PO) Take 1 tablet by mouth daily.     Yes Historical Provider, MD  omeprazole (PRILOSEC) 40 MG capsule Take 1 capsule (40 mg total) by mouth daily. 09/29/15  Yes Ria Bush, MD  traZODone (DESYREL) 100 MG tablet Take 1 tablet (100 mg total) by mouth at bedtime. 02/26/16  Yes Emily Filbert, MD  cyclobenzaprine (FLEXERIL) 10 MG tablet  07/31/16   Historical Provider, MD  guaiFENesin-codeine (CHERATUSSIN AC) 100-10 MG/5ML syrup Take 5 mLs by mouth 3 (three) times daily as needed for cough. 08/09/16   Elby Beck, FNP  HYDROcodone-acetaminophen (NORCO/VICODIN) 5-325 MG tablet  07/31/16   Historical Provider, MD  ipratropium (ATROVENT) 0.06 % nasal spray Place 2 sprays into both nostrils 4 (four) times daily. 09/24/16   Billy Fischer, MD    Family History Family History  Problem Relation Age of Onset  . Coronary artery disease Mother 47    CABG and valve replacement  . Arthritis Mother   . Valvular heart disease Mother     Had valve replacement  . Coronary artery disease Father 71    CAD/MI  . Hypertension Father   . Heart disease Father   . Diabetes Sister   . Hypertension Sister   . Hypertension Brother   . Coronary artery disease Brother     Stent  . Coronary artery disease Brother     Stents  . Hypertension Brother   . Hypertension Sister   . Mitral valve prolapse Sister   . Hypertension Sister   . Thyroid disease Sister   . Cancer Neg Hx     Social History Social History  Substance Use Topics  . Smoking status: Never Smoker  . Smokeless tobacco: Never Used  . Alcohol use Yes     Comment: rare     Allergies     Cymbalta [duloxetine hcl] and Percocet [oxycodone-acetaminophen]   Review of Systems Review of Systems  Constitutional: Positive for fever.  HENT: Positive for congestion, ear pain, facial swelling, postnasal drip, rhinorrhea, sinus pain and sinus pressure. Negative for sore throat.   All other systems reviewed and are negative.    Physical Exam Triage Vital Signs ED Triage Vitals [09/24/16 1235]  Enc Vitals Group     BP 131/64     Pulse Rate 67     Resp  Temp 98.7 F (37.1 C)     Temp Source Oral     SpO2 100 %     Weight      Height      Head Circumference      Peak Flow      Pain Score 3     Pain Loc      Pain Edu?      Excl. in [place]?    No data found.   Updated Vital Signs BP 131/64 (BP Location: Left Arm)   Pulse 67   Temp 98.7 F (37.1 C) (Oral)   SpO2 100%   Visual Acuity Right Eye Distance:   Left Eye Distance:   Bilateral Distance:    Right Eye Near:   Left Eye Near:    Bilateral Near:     Physical Exam  Constitutional: She appears well-developed and well-nourished. No distress.  HENT:  Right Ear: External ear normal.  Left Ear: External ear normal.  Nose: Mucosal edema and rhinorrhea present. Right sinus exhibits maxillary sinus tenderness. Left sinus exhibits maxillary sinus tenderness.  Mouth/Throat: Oropharynx is clear and moist.  Neck: Normal range of motion. Neck supple.  Lymphadenopathy:    She has no cervical adenopathy.  Nursing note and vitals reviewed.    UC Treatments / Results  Labs (all labs ordered are listed, but only abnormal results are displayed) Labs Reviewed - No data to display  EKG  EKG Interpretation None       Radiology No results found. X-rays reviewed and report per radiologist.  Procedures Procedures (including critical care time)  Medications Ordered in UC Medications - No data to display   Initial Impression / Assessment and Plan / UC Course  I have reviewed the triage vital signs and  the nursing notes.  Pertinent labs & imaging results that were available during my care of the patient were reviewed by me and considered in my medical decision making (see chart for details).       Final Clinical Impressions(s) / UC Diagnoses   Final diagnoses:  Acute nasopharyngitis    New Prescriptions Discharge Medication List as of 09/24/2016  1:41 PM    START taking these medications   Details  ipratropium (ATROVENT) 0.06 % nasal spray Place 2 sprays into both nostrils 4 (four) times daily., Starting Tue 09/24/2016, Normal         Billy Fischer, MD 10/08/16 2200

## 2016-09-24 NOTE — ED Triage Notes (Signed)
Pt reports sinus congestion and pain for about a month.  This weekend she reports fever and chills.  Sunday she had a fever of 100.9 relieved by Motrin 600 mg.

## 2016-09-27 MED ORDER — AMOXICILLIN-POT CLAVULANATE 875-125 MG PO TABS: 1.0000 | ORAL_TABLET | Freq: Two times a day (BID) | ORAL | 0 refills | Status: AC

## 2016-09-27 NOTE — Addendum Note (Signed)
Addended by: Ria Bush on: 09/27/2016 01:29 PM   Modules accepted: Orders

## 2016-10-14 ENCOUNTER — Other Ambulatory Visit: Payer: Self-pay | Admitting: Family Medicine

## 2016-10-17 DIAGNOSIS — M25562 Pain in left knee: Secondary | ICD-10-CM | POA: Diagnosis not present

## 2016-10-25 DIAGNOSIS — M25562 Pain in left knee: Secondary | ICD-10-CM | POA: Diagnosis not present

## 2016-10-31 DIAGNOSIS — M1712 Unilateral primary osteoarthritis, left knee: Secondary | ICD-10-CM | POA: Diagnosis not present

## 2016-12-09 ENCOUNTER — Other Ambulatory Visit: Payer: Self-pay | Admitting: Family Medicine

## 2016-12-31 ENCOUNTER — Other Ambulatory Visit: Payer: Self-pay | Admitting: Family Medicine

## 2017-01-17 ENCOUNTER — Ambulatory Visit: Payer: Medicare Other

## 2017-01-24 ENCOUNTER — Other Ambulatory Visit: Payer: Self-pay | Admitting: Family Medicine

## 2017-01-31 ENCOUNTER — Ambulatory Visit (INDEPENDENT_AMBULATORY_CARE_PROVIDER_SITE_OTHER): Payer: Medicare Other | Admitting: Cardiology

## 2017-01-31 ENCOUNTER — Encounter: Payer: Self-pay | Admitting: Cardiology

## 2017-01-31 VITALS — BP 120/78 | HR 64 | Ht 64.0 in | Wt 179.4 lb

## 2017-01-31 DIAGNOSIS — R0609 Other forms of dyspnea: Secondary | ICD-10-CM

## 2017-01-31 DIAGNOSIS — R002 Palpitations: Secondary | ICD-10-CM

## 2017-01-31 DIAGNOSIS — E785 Hyperlipidemia, unspecified: Secondary | ICD-10-CM

## 2017-01-31 DIAGNOSIS — I10 Essential (primary) hypertension: Secondary | ICD-10-CM | POA: Diagnosis not present

## 2017-01-31 NOTE — Progress Notes (Signed)
PCP: Brittany Bush, MD  Clinic Note: Chief Complaint  Patient presents with  . Follow-up    Palpitations    HPI: Brittany [name] is a 61 y.o. female with a PMH below who presents today for six-month follow-up for palpitations and exertional dyspnea. Brittany [name] is the wife of 1 patient's with CAD. She is self-referred for persistent symptoms of palpitations and exertional dyspnea and fatigue. She has a history of hypertension and hypothyroidism as well as dysthymia/anxiety. She takes once daily metoprolol as well as losartan for hypertension and palpitations.  Brittany [name] was last seen on 08/07/2016 - follow-up from her CPT test that basically recommended weight loss blood pressure control and exercise. She had an event monitor which showed a very short burst of 6-10 beats of PAT as well as some PACs and PVCs. She was actually feeling better at that time.  Recent Hospitalizations: Nothing recently  Studies Personally Reviewed - (if available, images/films reviewed: From Epic Chart or Care Everywhere)  None  Interval History: Brittany [name] presents today overall feeling better. 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 Her fatigue is still present, but notably improved. She's lost about 7 pounds. She is able to do activities: Alone in an other yard work without any significant chest tightness or pressure. She still has some palpitations every now and then but they all lastsonly a few seconds (and never >44minute). They are happening less frequently and not at night anymore. Maybe once a week. She has some dizzy spells but usually they occur when she is first standing up from sitting or sitting up from 6. This happens maybe once or twice a month.  No chest pain or shortness of breath with rest or exertion.  No PND, orthopnea or edema.  No syncope/near syncope, or TIA/amaurosis fugax symptoms. No claudication.  ROS: A comprehensive was performed. Review of Systems    Constitutional: Positive for malaise/fatigue (But improving gradually.) and weight loss.  HENT: Negative for nosebleeds.   Respiratory: Negative for cough, shortness of breath and wheezing.   Cardiovascular:       Per history of present illness  Gastrointestinal: Positive for abdominal pain. Negative for diarrhea (Not diarrhea, but has had some loose stools with her right-sided abdominal pain).  Genitourinary: Positive for flank pain (Right sided chronic pain).  Skin: Negative.   Neurological: Negative for focal weakness.       Dizziness per history of present illness  Endo/Heme/Allergies: Negative for environmental allergies.  Psychiatric/Behavioral: The patient is nervous/anxious.   All other systems reviewed and are negative.  I have reviewed and (if needed) personally updated the patient's problem list, medications, allergies, past medical and surgical history, social and family history.   Past Medical History:  Diagnosis Date  . Arthritis    neck pain and numbness left digits Brittany [name])  . Chronic lower back pain    persistent after L1 cmp fx, with multilevel mild lumbar DDD - good resolution after L L5/S1 ESI (Brittany [name])  . Colon polyps    last colonoscopy 2006? rpt due  . Diverticulitis   . Fatty liver 02/2012   on CT scan 02/2012  . GERD (gastroesophageal reflux disease)   . History of diverticulitis of colon    s/p resection of large intestine, rpt itis 02/2012  . HTN (hypertension)   . Hypothyroid   . Migraines    occasional, stress related  . OSA (obstructive sleep apnea) 09/2016   mild to moderate, consider  CPAP  . Postmenopausal    HRT compound, previously premarin  . Seasonal allergies   . Vertebral compression fracture (Brittany [name]) 07/2012   L1 25% Brittany [name]) released without intervention    Past Surgical History:  Procedure Laterality Date  . ANTERIOR FUSION CERVICAL SPINE  01/1999   cervical HNP  . COLON RESECTION  2008   2 ft removed, diverticulitis  . COLONOSCOPY   03/2012   partial colectomy, scattered diverticulae Brittany [name]), rpt 10 yrs  . ESI Left 06/2013, 12/2013, 09/2014   L5/S1 with good resolution of pain  . ESOPHAGOGASTRODUODENOSCOPY  03/2012   mod gastritis, benign polyps, H pylori neg  . FOOT SURGERY Left 01/2014   big toe fusion  . KNEE SURGERY  1995, 2011   torn menisci (MRI 2010 L)  . SPIROMETRY  2011   normal  . TONSILLECTOMY  1964  . TOTAL ABDOMINAL HYSTERECTOMY W/ BILATERAL SALPINGOOPHORECTOMY  06/2009   vag bleeding  . US ECHOCARDIOGRAPHY  08/2010   normal, EF 63%, mild valvular issues    Current Meds  Medication Sig  . aspirin (ASPIRIN EC) 81 MG EC tablet Take 81 mg by mouth daily. Swallow whole.  . Cyanocobalamin (VITAMIN B 12 PO) Take by mouth.  . fish oil-omega-3 fatty acids 1000 MG capsule Take 1 g by mouth daily.  Marland Kitchen ibuprofen (ADVIL,MOTRIN) 200 MG tablet Take 600 mg by mouth as needed.   Marland Kitchen levothyroxine (SYNTHROID, LEVOTHROID) 112 MCG tablet TAKE ONE TABLET BY MOUTH ONCE DAILY BEFORE BREAKFAST  . losartan (COZAAR) 50 MG tablet Take 1 tablet (50 mg total) by mouth daily. Needs an annual exam  . metoprolol tartrate (LOPRESSOR) 25 MG tablet TAKE ONE TABLET BY MOUTH ONCE DAILY  . Multiple Vitamins-Minerals (ALIVE WOMENS ENERGY PO) Take 1 tablet by mouth daily.    Marland Kitchen omeprazole (PRILOSEC) 40 MG capsule TAKE ONE CAPSULE BY MOUTH ONCE DAILY  . traZODone (DESYREL) 100 MG tablet Take 1 tablet (100 mg total) by mouth at bedtime.    Allergies  Allergen Reactions  . Cymbalta [Duloxetine Hcl] Other (See Comments)    suicidality  . Percocet [Oxycodone-Acetaminophen] Other (See Comments)    Nauseated, dizzy    Social History   Social History  . Marital status: Married    Spouse name: N/A  . Number of children: 2  . Years of education: HS   Occupational History  .  Not Employed   Social History Main Topics  . Smoking status: Never Smoker  . Smokeless tobacco: Never Used  . Alcohol use Yes     Comment: rare  . Drug use:  No  . Sexual activity: Yes    Birth control/ protection: Surgical   Other Topics Concern  . None   Social History Narrative   Caffeine: 2-3 cups/day (1-2 coffee); occasional beer   Lives with husband and 76yo son, 4 dogs   Activity: no scheduled - enjoys walking, but just usually run around doing shopping etc. Does not do routine exercise.   Diet: lots of water, fruits/vegetable    family history includes Arthritis in her mother; Coronary artery disease in her brother and brother; Coronary artery disease (age of onset: 15) in her father; Coronary artery disease (age of onset: 29) in her mother; Diabetes in her sister; Heart disease in her father; Hypertension in her brother, brother, father, sister, sister, and sister; Mitral valve prolapse in her sister; Thyroid disease in her sister; Valvular heart disease in her mother.  Wt Readings from Last 3 Encounters:  01/31/17 179 lb 6.4 oz (81.4 kg)  08/28/16 184 lb (83.5 kg)  08/09/16 186 lb 8 oz (84.6 kg)    PHYSICAL EXAM BP 120/78   Pulse 64   Ht 5\' 4"  (1.626 m)   Wt 179 lb 6.4 oz (81.4 kg)   SpO2 98%   BMI 30.79 kg/m  General appearance: alert, cooperative, appears stated age, no distress. Mildly obese, but healthy-appearing HEENT: [place]/AT, EOMI, MMM, anicteric sclera Neck: no adenopathy, no carotid bruit and no JVD Lungs: clear to auscultation bilaterally, normal percussion bilaterally and non-labored Heart: regular rate and rhythm, S1 &S2 normal, no murmur, click, rub or gallop; nondisplaced PMI Abdomen: soft, non-tender; bowel sounds normal; no masses,  no organomegaly; no HJR Extremities: extremities normal, atraumatic, no cyanosis, and edema -none Pulses: 2+ and symmetric;  Neurologic: Mental status: Alert & oriented x 3, thought content appropriate; non-focal exam.  Pleasant mood & affect.    Adult ECG Report None  Other studies Reviewed: Additional studies/ records that were reviewed today include:  Recent Labs:  None  available   ASSESSMENT / PLAN: Problem List Items Addressed This Visit    Dyspnea on exertion (Chronic)    Based on her test results, probably more related to deconditioning and obesity. She is working on weight loss and I congratulated her efforts. She is still trying to push through and doing better.      Essential hypertension (Chronic)    Well-controlled on losartan with nighttime metoprolol tartrate. I would like to switch this to Toprol, but always seems to go back to being Lopressor.      Hyperlipidemia with target LDL less than 100 (Chronic)    Followed by PCP. She had been started on a statin, but is not currently on 1      Palpitations - Primary (Chronic)    Relatively stable. They're not lasting very long and not overly asymptomatic. If they pick up, I may want to definitely convert her from metoprolol tartrate to succinate. These have improved since we switched her metoprolol dosing to evening.         Current medicines are reviewed at length with the patient today. (+/- concerns) None The following changes have been made: None  Patient Instructions  NO CHANGES WITH CURRENT MEDICATIONS  KEEP WELL HYDRATED    Your physician wants you to follow-up in [place].You will receive a reminder letter in the mail two months in advance. If you don't receive a letter, please call our office to schedule the follow-up appointment.   Studies Ordered:   No orders of the defined types were placed in this encounter.     Glenetta Hew, M.D., M.S. Interventional Cardiologist   Pager # [phone] Phone # [phone] [address] [place], [place] [address]

## 2017-01-31 NOTE — Patient Instructions (Addendum)
NO CHANGES WITH CURRENT MEDICATIONS  KEEP WELL HYDRATED    Your physician wants you to follow-up in 72 MONTHS WITH DR HARDING.You will receive a reminder letter in the mail two months in advance. If you don't receive a letter, please call our office to schedule the follow-up appointment.

## 2017-02-02 ENCOUNTER — Encounter: Payer: Self-pay | Admitting: Cardiology

## 2017-02-02 NOTE — Assessment & Plan Note (Signed)
Followed by PCP. She had been started on a statin, but is not currently on 1

## 2017-02-02 NOTE — Assessment & Plan Note (Signed)
Based on her test results, probably more related to deconditioning and obesity. She is working on weight loss and I congratulated her efforts. She is still trying to push through and doing better.

## 2017-02-02 NOTE — Assessment & Plan Note (Signed)
Well-controlled on losartan with nighttime metoprolol tartrate. I would like to switch this to Toprol, but always seems to go back to being Lopressor.

## 2017-02-02 NOTE — Assessment & Plan Note (Signed)
Relatively stable. They're not lasting very long and not overly asymptomatic. If they pick up, I may want to definitely convert her from metoprolol tartrate to succinate. These have improved since we switched her metoprolol dosing to evening.

## 2017-02-06 ENCOUNTER — Other Ambulatory Visit: Payer: Self-pay | Admitting: Family Medicine

## 2017-02-06 DIAGNOSIS — E039 Hypothyroidism, unspecified: Secondary | ICD-10-CM

## 2017-02-06 DIAGNOSIS — E785 Hyperlipidemia, unspecified: Secondary | ICD-10-CM

## 2017-02-07 ENCOUNTER — Ambulatory Visit (INDEPENDENT_AMBULATORY_CARE_PROVIDER_SITE_OTHER): Payer: Medicare Other

## 2017-02-07 VITALS — BP 110/74 | HR 61 | Temp 98.6°F | Ht 63.5 in | Wt 179.8 lb

## 2017-02-07 DIAGNOSIS — Z0000 Encounter for general adult medical examination without abnormal findings: Secondary | ICD-10-CM

## 2017-02-07 DIAGNOSIS — Z114 Encounter for screening for human immunodeficiency virus [HIV]: Secondary | ICD-10-CM

## 2017-02-07 DIAGNOSIS — E039 Hypothyroidism, unspecified: Secondary | ICD-10-CM | POA: Diagnosis not present

## 2017-02-07 DIAGNOSIS — Z1159 Encounter for screening for other viral diseases: Secondary | ICD-10-CM | POA: Diagnosis not present

## 2017-02-07 DIAGNOSIS — E785 Hyperlipidemia, unspecified: Secondary | ICD-10-CM | POA: Diagnosis not present

## 2017-02-07 LAB — BASIC METABOLIC PANEL
BUN: 14 mg/dL (ref 6–23)
CALCIUM: 8.9 mg/dL (ref 8.4–10.5)
CO2: 27 mEq/L (ref 19–32)
Chloride: 105 mEq/L (ref 96–112)
Creatinine, Ser: 1.07 mg/dL (ref 0.40–1.20)
GFR: 55.24 mL/min — AB (ref >60.00)
Glucose, Bld: 85 mg/dL (ref 70–99)
POTASSIUM: 4.1 meq/L (ref 3.5–5.1)
SODIUM: 139 meq/L (ref 135–145)

## 2017-02-07 LAB — LIPID PANEL
CHOL/HDL RATIO: 4
Cholesterol: 191 mg/dL (ref 0–200)
HDL: 51.7 mg/dL (ref >39.00)
LDL Cholesterol: 118 mg/dL — ABNORMAL HIGH (ref 0–99)
NONHDL: 139.05
TRIGLYCERIDES: 104 mg/dL (ref 0.0–149.0)
VLDL: 20.8 mg/dL (ref 0.0–40.0)

## 2017-02-07 LAB — TSH: TSH: 0.91 u[IU]/mL (ref 0.35–4.50)

## 2017-02-07 NOTE — Progress Notes (Signed)
PCP notes:   Health maintenance:  HIV screening - completed Hep C screening - completed PAP smear - not indicated; hysterectomy  Abnormal screenings:   None  Patient concerns:   Labs - pt requested to have lab work for female hormones, magnesium, and Vit D.   Cerumen in ear - PCP please assess pt's ears at CPE. Pt states she hears "crackling" in right ear.   Nurse concerns:  None  Next PCP appt:   02/11/17 @ 1200

## 2017-02-07 NOTE — Progress Notes (Signed)
Subjective:   Brittany [name] is a 61 y.o. female who presents for Medicare Annual (Subsequent) preventive examination.  Review of Systems:  N/A Cardiac Risk Factors include: advanced age (>101men, >45 women);obesity (BMI >30kg/m2);dyslipidemia;hypertension     Objective:     Vitals: BP 110/74 (BP Location: Right Arm, Patient Position: Sitting, Cuff Size: Normal)   Pulse 61   Temp 98.6 F (37 C) (Oral)   Ht 5' 3.5" (1.613 m) Comment: no shoes  Wt 179 lb 12 oz (81.5 kg)   SpO2 98%   BMI 31.34 kg/m   Body mass index is 31.34 kg/m.   Tobacco History  Smoking Status  . Never Smoker  Smokeless Tobacco  . Never Used     Counseling given: No   Past Medical History:  Diagnosis Date  . Arthritis    neck pain and numbness left digits Vertell Limber)  . Chronic lower back pain    persistent after L1 cmp fx, with multilevel mild lumbar DDD - good resolution after L L5/S1 ESI ([place])  . Colon polyps    last colonoscopy 2006? rpt due  . Diverticulitis   . Fatty liver 02/2012   on CT scan 02/2012  . GERD (gastroesophageal reflux disease)   . History of diverticulitis of colon    s/p resection of large intestine, rpt itis 02/2012  . HTN (hypertension)   . Hypothyroid   . Migraines    occasional, stress related  . OSA (obstructive sleep apnea) 09/2016   mild to moderate, consider CPAP  . Postmenopausal    HRT compound, previously premarin  . Seasonal allergies   . Vertebral compression fracture ([place]) 07/2012   L1 25% Vertell Limber) released without intervention   Past Surgical History:  Procedure Laterality Date  . ANTERIOR FUSION CERVICAL SPINE  01/1999   cervical HNP  . COLON RESECTION  2008   2 ft removed, diverticulitis  . COLONOSCOPY  03/2012   partial colectomy, scattered diverticulae [name], rpt 10 yrs  . ESI Left 06/2013, 12/2013, 09/2014   L5/S1 with good resolution of pain  . ESOPHAGOGASTRODUODENOSCOPY  03/2012   mod gastritis, benign polyps, H pylori neg  . FOOT  SURGERY Left 01/2014   big toe fusion  . KNEE SURGERY  1995, 2011   torn menisci (MRI 2010 L)  . SPIROMETRY  2011   normal  . TONSILLECTOMY  1964  . TOTAL ABDOMINAL HYSTERECTOMY W/ BILATERAL SALPINGOOPHORECTOMY  06/2009   vag bleeding  . US ECHOCARDIOGRAPHY  08/2010   normal, EF 63%, mild valvular issues   Family History  Problem Relation Age of Onset  . Coronary artery disease Mother 5       CABG and valve replacement  . Arthritis Mother   . Valvular heart disease Mother        Had valve replacement  . Coronary artery disease Father 55       CAD/MI  . Hypertension Father   . Heart disease Father   . Diabetes Sister   . Hypertension Sister   . Hypertension Brother   . Coronary artery disease Brother        Stent  . Coronary artery disease Brother        Stents  . Hypertension Brother   . Hypertension Sister   . Mitral valve prolapse Sister   . Hypertension Sister   . Thyroid disease Sister   . Cancer Neg Hx    History  Sexual Activity  . Sexual activity: Yes  . Birth  control/ protection: Surgical    Outpatient Encounter Prescriptions as of 02/07/2017  Medication Sig  . aspirin (ASPIRIN EC) 81 MG EC tablet Take 81 mg by mouth daily. Swallow whole.  . fish oil-omega-3 fatty acids 1000 MG capsule Take 1 g by mouth daily.  Marland Kitchen ibuprofen (ADVIL,MOTRIN) 200 MG tablet Take 600 mg by mouth as needed.   Marland Kitchen levothyroxine (SYNTHROID, LEVOTHROID) 112 MCG tablet TAKE ONE TABLET BY MOUTH ONCE DAILY BEFORE BREAKFAST  . losartan (COZAAR) 50 MG tablet Take 1 tablet (50 mg total) by mouth daily. Needs an annual exam  . metoprolol tartrate (LOPRESSOR) 25 MG tablet TAKE ONE TABLET BY MOUTH ONCE DAILY  . Multiple Vitamins-Minerals (ALIVE WOMENS ENERGY PO) Take 1 tablet by mouth daily.    Marland Kitchen omeprazole (PRILOSEC) 40 MG capsule TAKE ONE CAPSULE BY MOUTH ONCE DAILY  . traZODone (DESYREL) 100 MG tablet Take 1 tablet (100 mg total) by mouth at bedtime.  . [DISCONTINUED] Cyanocobalamin (VITAMIN  B 12 PO) Take by mouth.   No facility-administered encounter medications on file as of 02/07/2017.     Activities of Daily Living In your present state of health, do you have any difficulty performing the following activities: 02/07/2017  Hearing? N  Vision? N  Difficulty concentrating or making decisions? N  Walking or climbing stairs? Y  Dressing or bathing? N  Doing errands, shopping? N  Preparing Food and eating ? N  Using the Toilet? N  In the past six months, have you accidently leaked urine? N  Do you have problems with loss of bowel control? N  Managing your Medications? N  Managing your Finances? N  Housekeeping or managing your Housekeeping? N  Some recent data might be hidden    Patient Care Team: Ria Bush, MD as PCP - General (Family Medicine)    Assessment:     Hearing Screening   125Hz  250Hz  500Hz  1000Hz  2000Hz  3000Hz  4000Hz  6000Hz  8000Hz   Right ear:   40 40 40  40    Left ear:   40 40 40  40    Vision Screening Comments: Last vision exam in 2017   Exercise Activities and Dietary recommendations Current Exercise Habits: The patient does not participate in regular exercise at present, Exercise limited by: None identified  Goals    . Increase physical activity          Starting 02/07/2017, I will continue to monitor intake of simple carbohydrates and make healthier eating choices in an effort to lose 25 lbs.       Fall Risk Fall Risk  02/07/2017 09/29/2015 03/08/2014  Falls in the past year? No No No   Depression Screen PHQ 2/9 Scores 02/07/2017 09/29/2015 03/08/2014  PHQ - 2 Score 0 0 0     Cognitive Function MMSE - Mini Mental State Exam 02/07/2017  Orientation to time 5  Orientation to Place 5  Registration 3  Attention/ Calculation 0  Recall 3  Language- name 2 objects 0  Language- repeat 1  Language- follow 3 step command 3  Language- read & follow direction 0  Write a sentence 0  Copy design 0  Total score 20     PLEASE NOTE: A Mini-Cog  screen was completed. Maximum score is 20. A value of 0 denotes this part of [place] MMSE was not completed or the patient failed this part of the Mini-Cog screening.   Mini-Cog Screening Orientation to Time - Max 5 pts Orientation to Place - Max 5 pts Registration -  Max 3 pts Recall - Max 3 pts Language Repeat - Max 1 pts Language Follow 3 Step Command - Max 3 pts     Immunization History  Administered Date(s) Administered  . Influenza Split 09/26/2011  . Influenza,inj,Quad PF,36+ Mos 06/12/2016  . Influenza-Unspecified 06/10/2015  . Tdap 09/26/2011  . Zoster 06/12/2016   Screening Tests Health Maintenance  Topic Date Due  . PAP SMEAR  02/07/2026 (Originally 02/29/1977)  . INFLUENZA VACCINE  04/09/2017  . MAMMOGRAM  09/06/2017  . DTaP/Tdap/Td (2 - Td) 09/25/2021  . TETANUS/TDAP  09/25/2021  . COLONOSCOPY  04/01/2022  . Hepatitis C Screening  Completed  . HIV Screening  Completed      Plan:     I have personally reviewed and addressed the Medicare Annual Wellness questionnaire and have noted the following in the patient's chart:  A. Medical and social history B. Use of alcohol, tobacco or illicit drugs  C. Current medications and supplements D. Functional ability and status E.  Nutritional status F.  Physical activity G. Advance directives H. List of other physicians I.  Hospitalizations, surgeries, and ER visits in previous 12 months J.  [place] to include hearing, vision, cognitive, depression L. Referrals and appointments - none  In addition, I have reviewed and discussed with patient certain preventive protocols, quality metrics, and best practice recommendations. A written personalized care plan for preventive services as well as general preventive health recommendations were provided to patient.  See attached scanned questionnaire for additional information.   Signed,   Lindell Noe, MHA, BS, LPN Health Coach

## 2017-02-07 NOTE — Progress Notes (Signed)
Pre visit review using our clinic review tool, if applicable. No additional management support is needed unless otherwise documented below in the visit note. 

## 2017-02-07 NOTE — Patient Instructions (Signed)
Ms. Hoston , Thank you for taking time to come for your Medicare Wellness Visit. I appreciate your ongoing commitment to your health goals. Please review the following plan we discussed and let me know if I can assist you in the future.   These are the goals we discussed: Goals    . Increase physical activity          Starting 02/07/2017, I will continue to monitor intake of simple carbohydrates and make healthier eating choices in an effort to lose 25 lbs.        This is a list of the screening recommended for you and due dates:  Health Maintenance  Topic Date Due  . Pap Smear  02/07/2026*  . Flu Shot  04/09/2017  . Mammogram  09/06/2017  . DTaP/Tdap/Td vaccine (2 - Td) 09/25/2021  . Tetanus Vaccine  09/25/2021  . Colon Cancer Screening  04/01/2022  .  Hepatitis C: One time screening is recommended by Center for Disease Control  (CDC) for  adults born from [birth year] through 1966.   Completed  . HIV Screening  Completed  *Topic was postponed. The date shown is not the original due date.   Preventive Care for Adults  A healthy lifestyle and preventive care can promote health and wellness. Preventive health guidelines for adults include the following key practices.  . A routine yearly physical is a good way to check with your health care provider about your health and preventive screening. It is a chance to share any concerns and updates on your health and to receive a thorough exam.  . Visit your dentist for a routine exam and preventive care every 6 months. Brush your teeth twice a day and floss once a day. Good oral hygiene prevents tooth decay and gum disease.  . The frequency of eye exams is based on your age, health, family medical history, use  of contact lenses, and other factors. Follow your health care provider's ecommendations for frequency of eye exams.  . Eat a healthy diet. Foods like vegetables, fruits, whole grains, low-fat dairy products, and lean protein foods contain  the nutrients you need without too many calories. Decrease your intake of foods high in solid fats, added sugars, and salt. Eat the right amount of calories for you. Get information about a proper diet from your health care provider, if necessary.  . Regular physical exercise is one of the most important things you can do for your health. Most adults should get at least 150 minutes of moderate-intensity exercise (any activity that increases your heart rate and causes you to sweat) each week. In addition, most adults need muscle-strengthening exercises on 2 or more days a week.  Silver Sneakers may be a benefit available to you. To determine eligibility, you may visit the website: www.silversneakers.com or contact program at 418-494-6395 Mon-Fri between 8AM-8PM.   . Maintain a healthy weight. The body mass index (BMI) is a screening tool to identify possible weight problems. It provides an estimate of body fat based on height and weight. Your health care provider can find your BMI and can help you achieve or maintain a healthy weight.   For adults 20 years and older: ? A BMI below 18.5 is considered underweight. ? A BMI of 18.5 to 24.9 is normal. ? A BMI of 25 to 29.9 is considered overweight. ? A BMI of 30 and above is considered obese.   . Maintain normal blood lipids and cholesterol levels by exercising and minimizing  your intake of saturated fat. Eat a balanced diet with plenty of fruit and vegetables. Blood tests for lipids and cholesterol should begin at age 17 and be repeated every 5 years. If your lipid or cholesterol levels are high, you are over 50, or you are at high risk for heart disease, you may need your cholesterol levels checked more frequently. Ongoing high lipid and cholesterol levels should be treated with medicines if diet and exercise are not working.  . If you smoke, find out from your health care provider how to quit. If you do not use tobacco, please do not start.  . If  you choose to drink alcohol, please do not consume more than 2 drinks per day. One drink is considered to be 12 ounces (355 mL) of beer, 5 ounces (148 mL) of wine, or 1.5 ounces (44 mL) of liquor.  . If you are 7-32 years old, ask your health care provider if you should take aspirin to prevent strokes.  . Use sunscreen. Apply sunscreen liberally and repeatedly throughout the day. You should seek shade when your shadow is shorter than you. Protect yourself by wearing long sleeves, pants, a wide-brimmed hat, and sunglasses year round, whenever you are outdoors.  . Once a month, do a whole body skin exam, using a mirror to look at the skin on your back. Tell your health care provider of new moles, moles that have irregular borders, moles that are larger than a pencil eraser, or moles that have changed in shape or color.

## 2017-02-08 LAB — HIV ANTIBODY (ROUTINE TESTING W REFLEX): HIV: NONREACTIVE

## 2017-02-08 LAB — HEPATITIS C ANTIBODY: HCV AB: NEGATIVE

## 2017-02-08 NOTE — Progress Notes (Signed)
I reviewed health advisor's note, was available for consultation, and agree with documentation and plan.  

## 2017-02-11 ENCOUNTER — Encounter: Payer: Self-pay | Admitting: Family Medicine

## 2017-02-11 ENCOUNTER — Ambulatory Visit (INDEPENDENT_AMBULATORY_CARE_PROVIDER_SITE_OTHER): Payer: Medicare Other | Admitting: Family Medicine

## 2017-02-11 VITALS — BP 108/62 | HR 61 | Temp 98.2°F | Ht 63.5 in | Wt 178.8 lb

## 2017-02-11 DIAGNOSIS — G4700 Insomnia, unspecified: Secondary | ICD-10-CM

## 2017-02-11 DIAGNOSIS — H539 Unspecified visual disturbance: Secondary | ICD-10-CM

## 2017-02-11 DIAGNOSIS — E039 Hypothyroidism, unspecified: Secondary | ICD-10-CM

## 2017-02-11 DIAGNOSIS — E66811 Obesity, class 1: Secondary | ICD-10-CM

## 2017-02-11 DIAGNOSIS — Z7189 Other specified counseling: Secondary | ICD-10-CM

## 2017-02-11 DIAGNOSIS — H6121 Impacted cerumen, right ear: Secondary | ICD-10-CM | POA: Insufficient documentation

## 2017-02-11 DIAGNOSIS — E669 Obesity, unspecified: Secondary | ICD-10-CM

## 2017-02-11 DIAGNOSIS — E785 Hyperlipidemia, unspecified: Secondary | ICD-10-CM

## 2017-02-11 DIAGNOSIS — I10 Essential (primary) hypertension: Secondary | ICD-10-CM | POA: Diagnosis not present

## 2017-02-11 DIAGNOSIS — S32010S Wedge compression fracture of first lumbar vertebra, sequela: Secondary | ICD-10-CM

## 2017-02-11 MED ORDER — TRAZODONE HCL 100 MG PO TABS: 100.0000 mg | ORAL_TABLET | Freq: Every day | ORAL | 3 refills | Status: DC

## 2017-02-11 NOTE — Assessment & Plan Note (Signed)
Chronic, stable continue current regimen.  

## 2017-02-11 NOTE — Assessment & Plan Note (Signed)
Fracture sustained early 07/2012. She has healed well from this. F/u DEXA showed only mild osteopenia.

## 2017-02-11 NOTE — Assessment & Plan Note (Signed)
Continue trazodone nightly.  

## 2017-02-11 NOTE — Assessment & Plan Note (Signed)
Benign limited fundoscopic exam. I did recommend she schedule expedited f/u with optometrist

## 2017-02-11 NOTE — Assessment & Plan Note (Addendum)
Not currently on statin. Strong fmhx CAD - likely goal <100. Will continue to work towards healthy diet to control cholesterol levels and declines statin at this time.  The 10-year ASCVD risk score Brittany [name]., et al., 2013) is: 3.5%   Values used to calculate the score:     Age: 61 years     Sex: Female     Is Non-Hispanic African American: No     Diabetic: No     Tobacco smoker: No     Systolic Blood Pressure: 286 mmHg     Is BP treated: Yes     HDL Cholesterol: 51.7 mg/dL     Total Cholesterol: 191 mg/dL

## 2017-02-11 NOTE — Assessment & Plan Note (Signed)
Successful irrigation performed today.

## 2017-02-11 NOTE — Progress Notes (Signed)
BP 108/62   Pulse 61   Temp 98.2 F (36.8 C) (Oral)   Ht 5' 3.5" (1.613 m)   Wt 178 lb 12 oz (81.1 kg)   SpO2 96%   BMI 31.17 kg/m    CC: CPE Subjective:    Patient ID: Brittany [name], female    DOB: Jul 15, 1956, 61 y.o.   MRN: [id number]  HPI: Brittany [name] is a 61 y.o. female presenting on 02/11/2017 for Annual Exam; Rash (left leg); and Cerumen Impaction (bilateral R>L per pt)   Saw Katha Cabal last week for medicare wellness visit. Note reviewed. She requested hormonal evaluation as well as magnesium and vit D, requested cerumen irrigation. She has been using OTC ear flush and peroxide.   Leg mole - present for 1 year. Not itchy or tender. May be getting scaly. Hasn't tried anything for this yet.   Losing weight - watching diet. She stays very active.   Notes wavy opaque orb out of R eye intermittently "like looking through water". No photopsia.   Preventative: COLONOSCOPY Date: 03/2012 partial colectomy, scattered diverticulae [name], rpt 10 yrs ESOPHAGOGASTRODUODENOSCOPY Date: 03/2012 mod gastritis, benign polyps, H pylori neg Well woman - s/p hysterectomy (benign reason). Doesn't need rpt pap. Ovaries removed.  Mammogram 08/2016 WNL DEXA 08/2012 osteopenia - and h/o L1 compression fracture.  Flu shot - yearly Tdap - 09/2011 zostavax - 06/2016 shingrix - discussed Advanced directives: does not have done. Has handout at home. Thinks would want sister [name] to be HCPOA. Seat belt use discussed Sunscreen use discussed. Wants mole on leg checked.  Non smoker Alcohol - occasional  Caffeine: 2-3 cups/day (1-2 coffee)  Lives with husband and 52yo son, 4 dogs  Activity: yardwork Diet: good water, fruits/vegetable daily   Relevant past medical, surgical, family and social history reviewed and updated as indicated. Interim medical history since our last visit reviewed. Allergies and medications reviewed and updated. Outpatient Medications Prior to Visit  Medication Sig  Dispense Refill  . aspirin (ASPIRIN EC) 81 MG EC tablet Take 81 mg by mouth daily. Swallow whole.    . fish oil-omega-3 fatty acids 1000 MG capsule Take 1 g by mouth daily.    Marland Kitchen ibuprofen (ADVIL,MOTRIN) 200 MG tablet Take 600 mg by mouth as needed.     Marland Kitchen levothyroxine (SYNTHROID, LEVOTHROID) 112 MCG tablet TAKE ONE TABLET BY MOUTH ONCE DAILY BEFORE BREAKFAST 90 tablet 1  . losartan (COZAAR) 50 MG tablet Take 1 tablet (50 mg total) by mouth daily. Needs an annual exam 90 tablet 0  . metoprolol tartrate (LOPRESSOR) 25 MG tablet TAKE ONE TABLET BY MOUTH ONCE DAILY 90 tablet 0  . Multiple Vitamins-Minerals (ALIVE WOMENS ENERGY PO) Take 1 tablet by mouth daily.      Marland Kitchen omeprazole (PRILOSEC) 40 MG capsule TAKE ONE CAPSULE BY MOUTH ONCE DAILY 90 capsule 0  . traZODone (DESYREL) 100 MG tablet Take 1 tablet (100 mg total) by mouth at bedtime. 31 tablet 12   No facility-administered medications prior to visit.      Per HPI unless specifically indicated in ROS section below Review of Systems     Objective:    BP 108/62   Pulse 61   Temp 98.2 F (36.8 C) (Oral)   Ht 5' 3.5" (1.613 m)   Wt 178 lb 12 oz (81.1 kg)   SpO2 96%   BMI 31.17 kg/m   Wt Readings from Last 3 Encounters:  02/11/17 178 lb 12 oz (81.1 kg)  02/07/17 179 lb  12 oz (81.5 kg)  01/31/17 179 lb 6.4 oz (81.4 kg)    Physical Exam  Constitutional: She is oriented to person, place, and time. She appears well-developed and well-nourished. No distress.  HENT:  Head: Normocephalic and atraumatic.  Right Ear: Hearing, external ear and ear canal normal.  Left Ear: Hearing, tympanic membrane, external ear and ear canal normal.  Nose: Nose normal.  Mouth/Throat: Uvula is midline, oropharynx is clear and moist and mucous membranes are normal. No oropharyngeal exudate, posterior oropharyngeal edema or posterior oropharyngeal erythema.  Cerumen in R ear canal, manual removal not fully successful so irrigation performed.   Eyes:  Conjunctivae and EOM are normal. Pupils are equal, round, and reactive to light. No scleral icterus.  Neck: Normal range of motion. Neck supple. No thyromegaly present.  Cardiovascular: Normal rate, regular rhythm, normal heart sounds and intact distal pulses.   No murmur heard. Pulses:      Radial pulses are 2+ on the right side, and 2+ on the left side.  Pulmonary/Chest: Effort normal and breath sounds normal. No respiratory distress. She has no wheezes. She has no rales.  Abdominal: Soft. Bowel sounds are normal. She exhibits no distension and no mass. There is no tenderness. There is no rebound and no guarding.  Musculoskeletal: Normal range of motion. She exhibits no edema.  Lymphadenopathy:    She has no cervical adenopathy.  Neurological: She is alert and oriented to person, place, and time.  CN grossly intact, station and gait intact  Skin: Skin is warm and dry. No rash noted.  SK present left lateral lower leg  Psychiatric: She has a normal mood and affect. Her behavior is normal. Judgment and thought content normal.  Nursing note and vitals reviewed.  Results for orders placed or performed in visit on 02/07/17  Lipid panel  Result Value Ref Range   Cholesterol 191 0 - 200 mg/dL   Triglycerides 104.0 0.0 - 149.0 mg/dL   HDL 51.70 >39.00 mg/dL   VLDL 20.8 0.0 - 40.0 mg/dL   LDL Cholesterol 118 (H) 0 - 99 mg/dL   Total CHOL/HDL Ratio 4    NonHDL 139.05   TSH  Result Value Ref Range   TSH 0.91 0.35 - 4.50 uIU/mL  Basic metabolic panel  Result Value Ref Range   Sodium 139 135 - 145 mEq/L   Potassium 4.1 3.5 - 5.1 mEq/L   Chloride 105 96 - 112 mEq/L   CO2 27 19 - 32 mEq/L   Glucose, Bld 85 70 - 99 mg/dL   BUN 14 6 - 23 mg/dL   Creatinine, Ser 1.07 0.40 - 1.20 mg/dL   Calcium 8.9 8.4 - 10.5 mg/dL   GFR 55.24 (L) >60.00 mL/min  HIV antibody (with reflex)  Result Value Ref Range   HIV 1&2 Ab, 4th Generation NONREACTIVE NONREACTIVE  Hepatitis C antibody  Result Value Ref  Range   HCV Ab NEGATIVE NEGATIVE      Assessment & Plan:   Problem List Items Addressed This Visit    Advanced care planning/counseling discussion    Advanced directives: does not have done. Has handout at home. Thinks would want sister [name] to be HCPOA.      Compression fracture of L1 lumbar vertebra (HCC)    Fracture sustained early 07/2012. She has healed well from this. F/u DEXA showed only mild osteopenia.       Essential hypertension (Chronic)    Chronic, stable. Continue current regimen. Reviewed metoprolol tartrate vs succinate -  she is currently on tartrate. Will continue this for now.       Hyperlipidemia with target LDL less than 100 (Chronic)    Not currently on statin. Strong fmhx CAD - likely goal <100. Will continue to work towards healthy diet to control cholesterol levels and declines statin at this time.  The 10-year ASCVD risk score Mikey Bussing DC Brooke Bonito., et al., 2013) is: 3.5%   Values used to calculate the score:     Age: 58 years     Sex: Female     Is Non-Hispanic African American: No     Diabetic: No     Tobacco smoker: No     Systolic Blood Pressure: 753 mmHg     Is BP treated: Yes     HDL Cholesterol: 51.7 mg/dL     Total Cholesterol: 191 mg/dL        Hypothyroidism    Chronic, stable continue current regimen.       Impacted cerumen of right ear - Primary    Successful irrigation performed today.       Insomnia    Continue trazodone nightly.       Obesity, Class I, BMI 30-34.9 (Chronic)    Discussed healthy diet and lifestyle changes to affect sustainable weight loss.       Vision changes    Benign limited fundoscopic exam. I did recommend she schedule expedited f/u with optometrist           Follow up plan: Return in about 1 year (around 02/11/2018) for medicare wellness visit, follow up visit.  Ria Bush, MD

## 2017-02-11 NOTE — Assessment & Plan Note (Signed)
Advanced directives: does not have done. Has handout at home. Thinks would want sister Brittany [name] to be HCPOA.

## 2017-02-11 NOTE — Patient Instructions (Addendum)
Bring me copy of your living will. Good to see you today, call us with questions. You are doing well today. Return as needed or in 1 year for next medicare wellness with Katha Cabal and f/u with me.  Schedule follow up with eye doctor.

## 2017-02-11 NOTE — Assessment & Plan Note (Signed)
Discussed healthy diet and lifestyle changes to affect sustainable weight loss  

## 2017-02-11 NOTE — Assessment & Plan Note (Addendum)
Chronic, stable. Continue current regimen. Reviewed metoprolol tartrate vs succinate - she is currently on tartrate. Will continue this for now.

## 2017-03-09 ENCOUNTER — Encounter: Payer: Self-pay | Admitting: Family Medicine

## 2017-03-11 MED ORDER — TRAZODONE HCL 100 MG PO TABS: 100.0000 mg | ORAL_TABLET | Freq: Every day | ORAL | 3 refills | Status: DC

## 2017-03-20 ENCOUNTER — Encounter: Payer: Self-pay | Admitting: Family Medicine

## 2017-03-20 ENCOUNTER — Other Ambulatory Visit: Payer: Self-pay | Admitting: Family Medicine

## 2017-03-20 MED ORDER — LOSARTAN POTASSIUM 50 MG PO TABS: 50.0000 mg | ORAL_TABLET | Freq: Every day | ORAL | 0 refills | Status: DC

## 2017-04-07 ENCOUNTER — Other Ambulatory Visit: Payer: Self-pay | Admitting: Family Medicine

## 2017-04-08 ENCOUNTER — Encounter: Payer: Self-pay | Admitting: Family Medicine

## 2017-05-14 ENCOUNTER — Other Ambulatory Visit: Payer: Self-pay | Admitting: Family Medicine

## 2017-06-03 IMAGING — DX DG SINUSES COMPLETE 3+V
3 series · 3 of 3 positions shown · non-contrast
Comparison: None

CLINICAL DATA: Sinus congestion

EXAM:
PARANASAL SINUSES - COMPLETE 3 + VIEW

[skull lat]
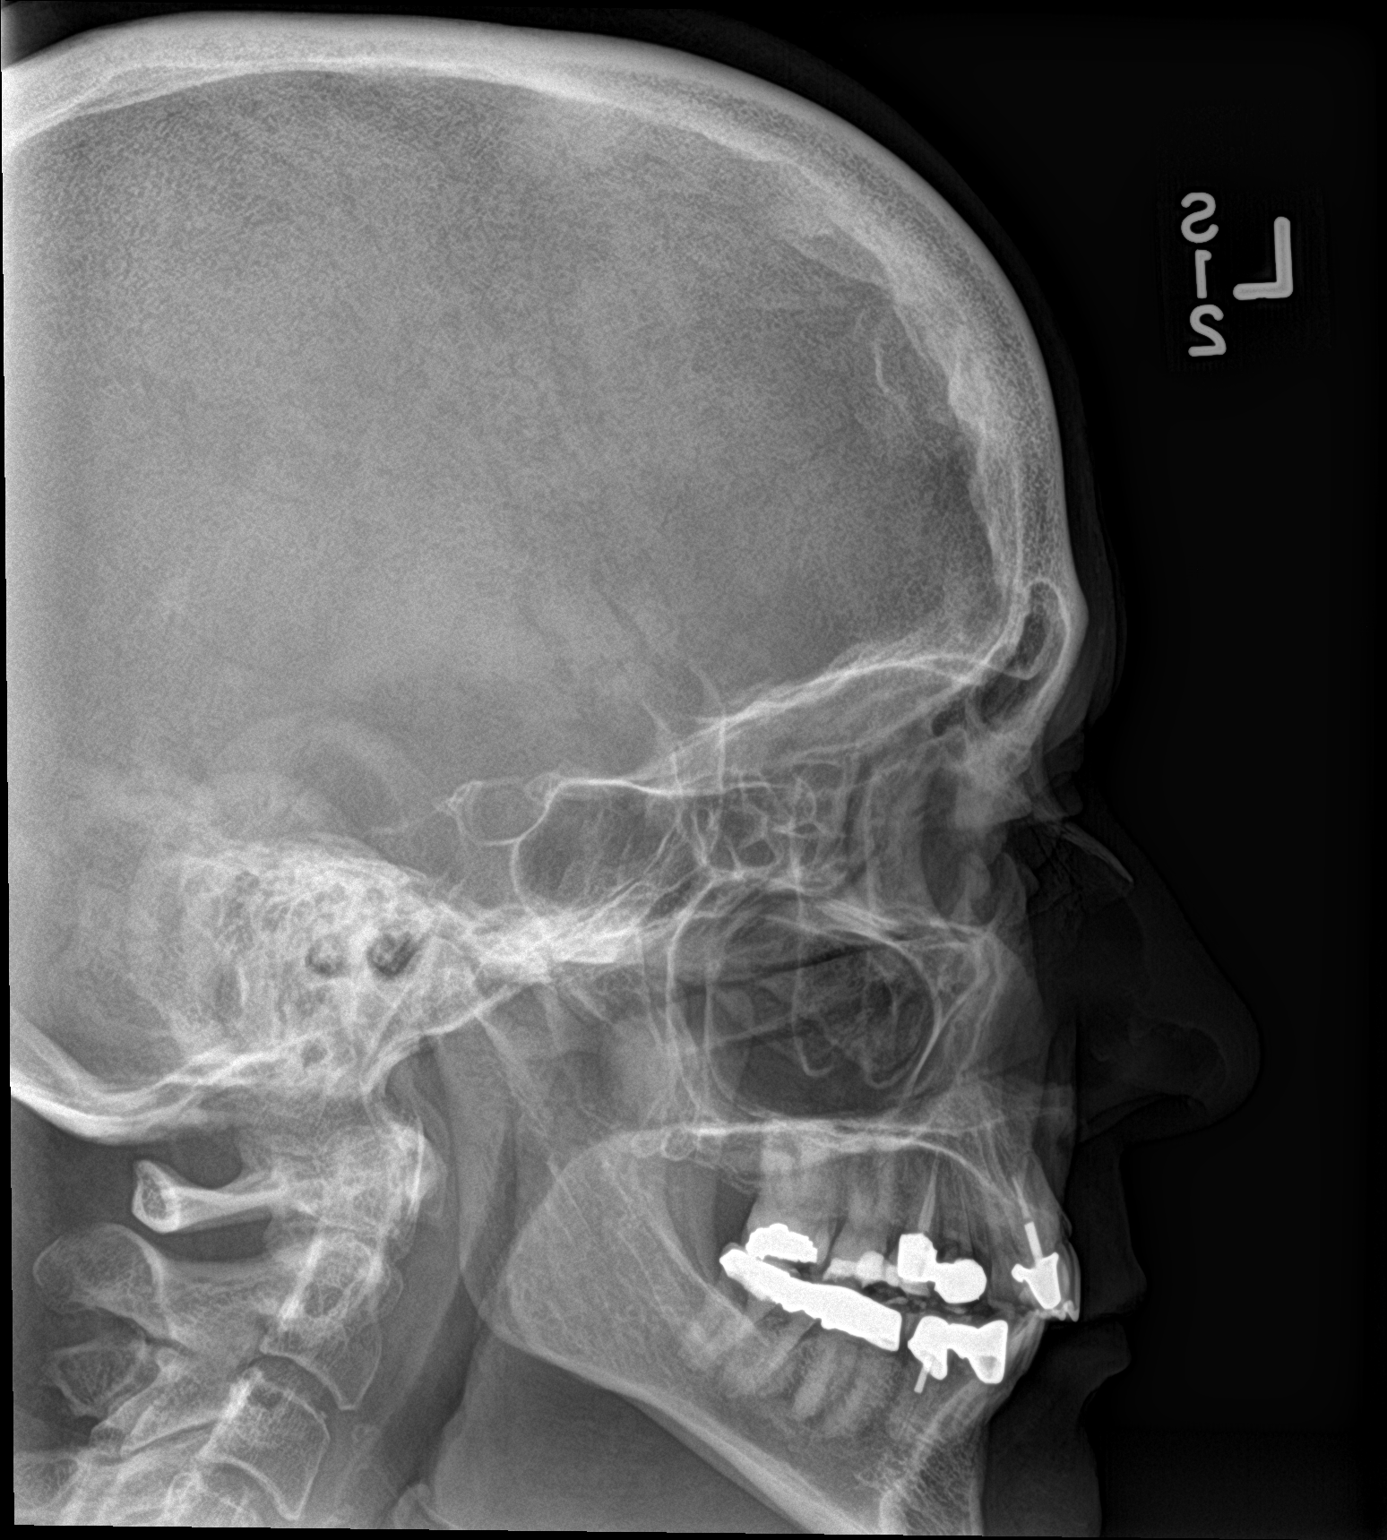

[skull calldwell]
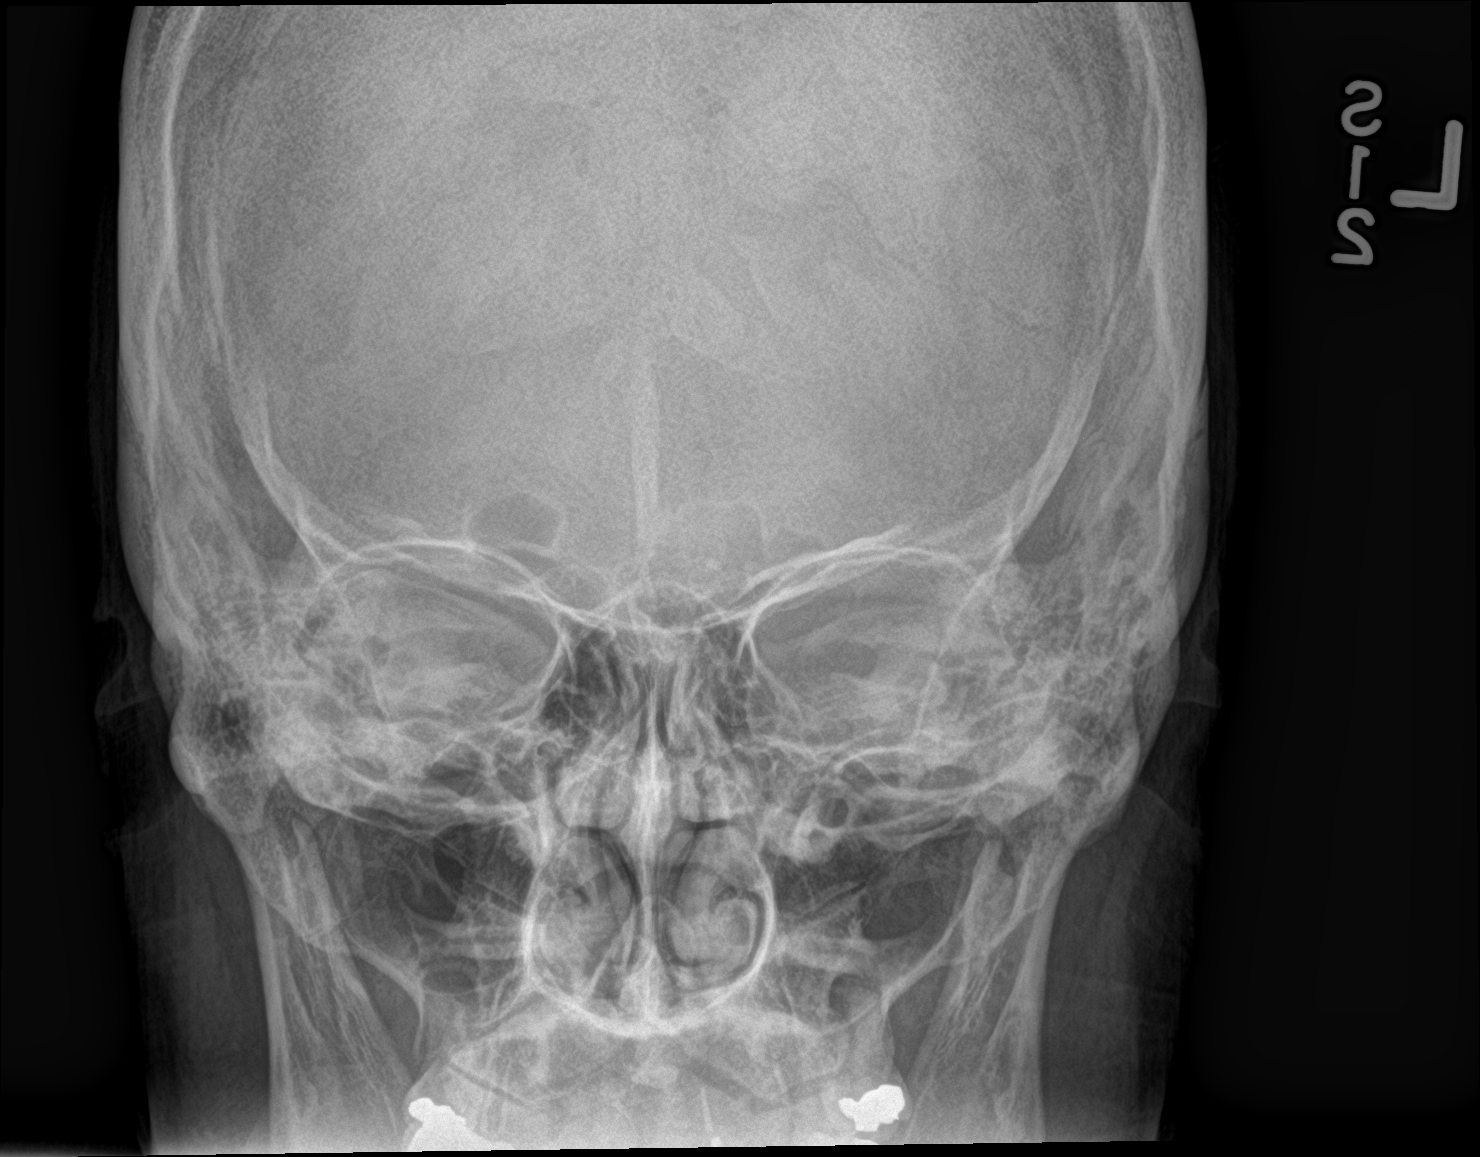

[skull waters]
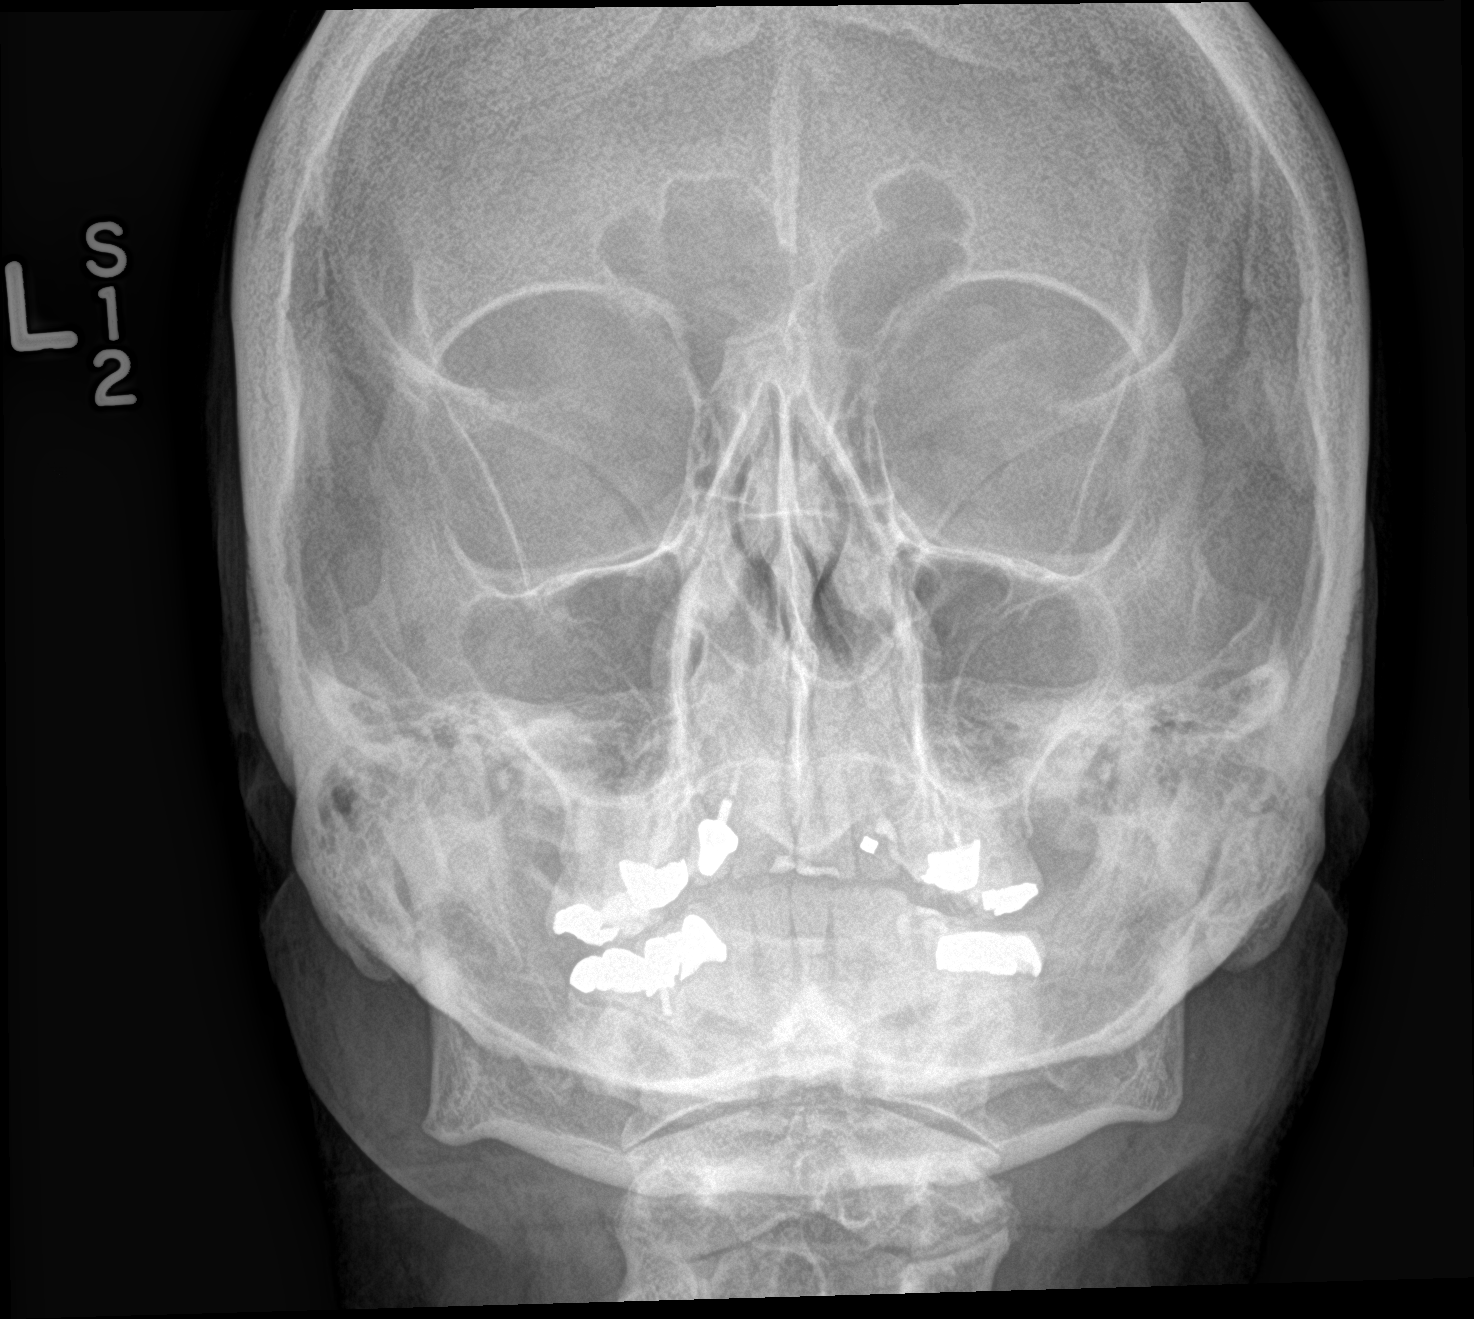

[3 of 3 positions shown; findings below may reference images not displayed]

FINDINGS: The paranasal sinus are aerated. There is no evidence of sinus
opacification air-fluid levels or mucosal thickening. No significant
bone abnormalities are seen.
IMPRESSION: Negative.

## 2017-06-08 ENCOUNTER — Encounter: Payer: Self-pay | Admitting: Family Medicine

## 2017-06-13 ENCOUNTER — Ambulatory Visit (HOSPITAL_COMMUNITY)
Admission: EM | Admit: 2017-06-13 | Discharge: 2017-06-13 | Disposition: A | Discharge status: Home / Self Care | Payer: Medicare Other | Attending: Internal Medicine | Admitting: Internal Medicine

## 2017-06-13 ENCOUNTER — Encounter (HOSPITAL_COMMUNITY): Payer: Self-pay | Admitting: Family Medicine

## 2017-06-13 ENCOUNTER — Ambulatory Visit (INDEPENDENT_AMBULATORY_CARE_PROVIDER_SITE_OTHER): Payer: Medicare Other

## 2017-06-13 DIAGNOSIS — M19071 Primary osteoarthritis, right ankle and foot: Secondary | ICD-10-CM | POA: Diagnosis not present

## 2017-06-13 DIAGNOSIS — M79671 Pain in right foot: Secondary | ICD-10-CM

## 2017-06-13 MED ORDER — PREDNISONE 5 MG (21) PO TBPK: ORAL_TABLET | ORAL | 0 refills | Status: DC

## 2017-06-13 NOTE — Discharge Instructions (Signed)
Your foot pain is unclear. Xrays are normal. Possible tendonitis. Suggest REST, ice and start on the prednisone pack which is a strong anti-inflammatory and if you are not improving in 1 week or so then suggest making an appt with Orthopedics. Hope this helps.

## 2017-06-13 NOTE — ED Provider Notes (Signed)
[facility]    CSN: [id number] Arrival date & time: 06/13/17  1304     History   Chief Complaint Chief Complaint  Patient presents with  . Foot Pain    HPI Brittany [name] is a 61 y.o. female.   61 yo presents with pain and swelling to the right foot. No known injury. She felt some pain 4 days ago and it has progressed. She has not rested or iced and kept doing her usual activity. She has not noticed redness or warmth. No fevers.       Past Medical History:  Diagnosis Date  . Arthritis    neck pain and numbness left digits Vertell Limber)  . Chronic lower back pain    persistent after L1 cmp fx, with multilevel mild lumbar DDD - good resolution after L L5/S1 ESI ([place])  . Colon polyps    last colonoscopy 2006? rpt due  . Diverticulitis   . Fatty liver 02/2012   on CT scan 02/2012  . GERD (gastroesophageal reflux disease)   . History of diverticulitis of colon    s/p resection of large intestine, rpt itis 02/2012  . HTN (hypertension)   . Hypothyroid   . Migraines    occasional, stress related  . OSA (obstructive sleep apnea) 09/2016   mild to moderate, consider CPAP  . Postmenopausal    HRT compound, previously premarin  . Seasonal allergies   . Vertebral compression fracture ([place]) 07/2012   L1 25% Vertell Limber) released without intervention    Patient Active Problem List   Diagnosis Date Noted  . Impacted cerumen of right ear 02/11/2017  . Vision changes 02/11/2017  . Acute right-sided thoracic back pain 08/03/2016  . RUQ abdominal pain 08/03/2016  . Palpitations 07/10/2016  . Fatigue due to sleep pattern disturbance 07/10/2016  . Dyspnea on exertion 07/10/2016  . Advanced care planning/counseling discussion 09/29/2015  . Insomnia 03/08/2014  . Obesity, Class I, BMI 30-34.9 07/21/2013  . Sacroiliitis ([place]) 04/26/2013  . Compression fracture of L1 lumbar vertebra ([place]) 08/11/2012  . Colon polyp 09/26/2011  . Medicare annual wellness visit, subsequent  09/26/2011  . Hearing loss 09/26/2011  . Left knee pain 09/16/2011  . Cervical spondylosis with myelopathy 09/16/2011  . Hyperlipidemia with target LDL less than 100 06/03/2011  . Hot flashes 04/29/2011  . Depression   . GERD (gastroesophageal reflux disease)   . Essential hypertension   . Migraines   . OSA (obstructive sleep apnea)   . Hypothyroidism     Past Surgical History:  Procedure Laterality Date  . ANTERIOR FUSION CERVICAL SPINE  01/1999   cervical HNP  . COLON RESECTION  2008   2 ft removed, diverticulitis  . COLONOSCOPY  03/2012   partial colectomy, scattered diverticulae [name], rpt 10 yrs  . ESI Left 06/2013, 12/2013, 09/2014   L5/S1 with good resolution of pain  . ESOPHAGOGASTRODUODENOSCOPY  03/2012   mod gastritis, benign polyps, H pylori neg  . FOOT SURGERY Left 01/2014   big toe fusion  . KNEE SURGERY  1995, 2011   torn menisci (MRI 2010 L)  . SPIROMETRY  2011   normal  . TONSILLECTOMY  1964  . TOTAL ABDOMINAL HYSTERECTOMY W/ BILATERAL SALPINGOOPHORECTOMY  06/2009   vag bleeding  . US ECHOCARDIOGRAPHY  08/2010   normal, EF 63%, mild valvular issues    OB History    Gravida Para Term Preterm AB Living   2 2 2      2  SAB TAB Ectopic Multiple Live Births           2       Home Medications    Prior to Admission medications   Medication Sig Start Date End Date Taking? Authorizing Provider  aspirin (ASPIRIN EC) 81 MG EC tablet Take 81 mg by mouth daily. Swallow whole.    [provider]  fish oil-omega-3 fatty acids 1000 MG capsule Take 1 g by mouth daily.    [provider]  ibuprofen (ADVIL,MOTRIN) 200 MG tablet Take 600 mg by mouth as needed.     [provider]  levothyroxine (SYNTHROID, LEVOTHROID) 112 MCG tablet TAKE ONE TABLET BY MOUTH ONCE DAILY BEFORE BREAKFAST 01/24/17   Ria Bush, MD  losartan (COZAAR) 50 MG tablet Take 1 tablet (50 mg total) by mouth daily. Needs an annual exam 03/20/17   Ria Bush, MD  metoprolol tartrate (LOPRESSOR) 25 MG tablet TAKE 1 TABLET BY MOUTH ONCE DAILY 05/14/17   Ria Bush, MD  Multiple Vitamins-Minerals (ALIVE WOMENS ENERGY PO) Take 1 tablet by mouth daily.      [provider]  omeprazole (PRILOSEC) 40 MG capsule TAKE 1 CAPSULE BY MOUTH ONCE DAILY 04/08/17   Ria Bush, MD  predniSONE (STERAPRED UNI-PAK 21 TAB) 5 MG (21) TBPK tablet Take as directed for 6 days 06/13/17   Bjorn Pippin, PA-C  traZODone (DESYREL) 100 MG tablet Take 1 tablet (100 mg total) by mouth at bedtime. 03/11/17   Ria Bush, MD    Family History Family History  Problem Relation Age of Onset  . Coronary artery disease Mother 105       CABG and valve replacement  . Arthritis Mother   . Valvular heart disease Mother        Had valve replacement  . Coronary artery disease Father 61       CAD/MI  . Hypertension Father   . Heart disease Father   . Diabetes Sister   . Hypertension Sister   . Hypertension Brother   . Coronary artery disease Brother        Stent  . Coronary artery disease Brother        Stents  . Hypertension Brother   . Hypertension Sister   . Mitral valve prolapse Sister   . Hypertension Sister   . Thyroid disease Sister   . Cancer Neg Hx     Social History Social History  Substance Use Topics  . Smoking status: Never Smoker  . Smokeless tobacco: Never Used  . Alcohol use Yes     Comment: rare     Allergies   Cymbalta [duloxetine hcl] and Percocet [oxycodone-acetaminophen]   Review of Systems Review of Systems  All other systems reviewed and are negative.    Physical Exam Triage Vital Signs ED Triage Vitals [06/13/17 1351]  Enc Vitals Group     BP 131/79     Pulse Rate 71     Resp 18     Temp 98.4 F (36.9 C)     Temp src      SpO2 100 %     Weight      Height      Head Circumference      Peak Flow      Pain Score 6     Pain Loc      Pain Edu?      Excl. in [place]?    No data  found.   Updated Vital Signs  BP 131/79   Pulse 71   Temp 98.4 F (36.9 C)   Resp 18   SpO2 100%   Visual Acuity Right Eye Distance:   Left Eye Distance:   Bilateral Distance:    Right Eye Near:   Left Eye Near:    Bilateral Near:     Physical Exam  Constitutional: She appears well-developed and well-nourished.  Musculoskeletal:  Mild swelling to right dorsum lateral aspect of foot. Pain to deep palpation. No erythema or warmth. Full ROM of ankle with good strength  Neurological: She is alert.  Skin: Skin is warm. No erythema.  Psychiatric: Her behavior is normal.  Nursing note and vitals reviewed.    UC Treatments / Results  Labs (all labs ordered are listed, but only abnormal results are displayed) Labs Reviewed - No data to display  EKG  EKG Interpretation None       Radiology Dg Foot Complete Right  Result Date: 06/13/2017 CLINICAL DATA:  Foot pain EXAM: RIGHT FOOT COMPLETE - 3+ VIEW COMPARISON:  12/20/2013 FINDINGS: No fracture or malalignment. Small plantar calcaneal spur. Moderate arthritis at the first MTP joint. IMPRESSION: 1. No acute osseous abnormality 2. Degenerative changes at the first MTP joint 3. Small plantar calcaneal spur Electronically Signed   By: Donavan Foil M.D.   On: 06/13/2017 14:28    Procedures Procedures (including critical care time)  Medications Ordered in UC Medications - No data to display   Initial Impression / Assessment and Plan / UC Course  I have reviewed the triage vital signs and the nursing notes.  Pertinent labs & imaging results that were available during my care of the patient were reviewed by me and considered in my medical decision making (see chart for details).     Right foot pain without evidence of stress fracture by xray. No presentation of an inflammatory arthritis. Possible tendonitis. Treat with prednisone pack, ice and rest. If continues despite these efforts then suggest f/u with Orthopedics for  further assessment.   Final Clinical Impressions(s) / UC Diagnoses   Final diagnoses:  Foot pain, right    New Prescriptions Discharge Medication List as of 06/13/2017  2:50 PM    START taking these medications   Details  predniSONE (STERAPRED UNI-PAK 21 TAB) 5 MG (21) TBPK tablet Take as directed for 6 days, Print         Controlled Substance Prescriptions [place] Controlled Substance Registry consulted? Not Applicable   Bjorn Pippin, PA-C 06/13/17 1508

## 2017-06-13 NOTE — ED Triage Notes (Signed)
Pt here for 4 days of right dorsal foot pain. Denies any specific injury.

## 2017-06-16 ENCOUNTER — Other Ambulatory Visit: Payer: Self-pay | Admitting: Family Medicine

## 2017-06-27 ENCOUNTER — Encounter (HOSPITAL_COMMUNITY): Payer: Self-pay | Admitting: *Deleted

## 2017-06-27 DIAGNOSIS — Z23 Encounter for immunization: Secondary | ICD-10-CM | POA: Diagnosis not present

## 2017-07-01 DIAGNOSIS — M5136 Other intervertebral disc degeneration, lumbar region: Secondary | ICD-10-CM | POA: Diagnosis not present

## 2017-07-15 DIAGNOSIS — M5136 Other intervertebral disc degeneration, lumbar region: Secondary | ICD-10-CM | POA: Diagnosis not present

## 2017-07-25 ENCOUNTER — Ambulatory Visit (INDEPENDENT_AMBULATORY_CARE_PROVIDER_SITE_OTHER): Payer: Medicare Other | Admitting: Family Medicine

## 2017-07-25 ENCOUNTER — Encounter: Payer: Self-pay | Admitting: Family Medicine

## 2017-07-25 VITALS — BP 116/64 | HR 74 | Temp 98.5°F | Wt 188.0 lb

## 2017-07-25 DIAGNOSIS — R300 Dysuria: Secondary | ICD-10-CM

## 2017-07-25 DIAGNOSIS — N3001 Acute cystitis with hematuria: Secondary | ICD-10-CM | POA: Diagnosis not present

## 2017-07-25 LAB — POC URINALSYSI DIPSTICK (AUTOMATED)
BILIRUBIN UA: NEGATIVE
Glucose, UA: NEGATIVE
KETONES UA: NEGATIVE
Nitrite, UA: NEGATIVE
PH UA: 5 (ref 5.0–8.0)
Urobilinogen, UA: 0.2 E.U./dL

## 2017-07-25 MED ORDER — CEPHALEXIN 500 MG PO CAPS: 500.0000 mg | ORAL_CAPSULE | Freq: Two times a day (BID) | ORAL | 0 refills | Status: DC

## 2017-07-25 NOTE — Progress Notes (Signed)
BP 116/64 (BP Location: Left Arm, Patient Position: Sitting, Cuff Size: Normal)   Pulse 74   Temp 98.5 F (36.9 C) (Oral)   Wt 188 lb (85.3 kg)   SpO2 97%   BMI 32.78 kg/m    CC: UTI? Subjective:    Patient ID: Myrissa Chipley, female    DOB: 1956-08-03, 61 y.o.   MRN: [id number]  HPI: Miriya Cloer is a 61 y.o. female presenting on 07/25/2017 for Dysuria (Started 07/22/17, worsening); Urinary Frequency; and Urinary Urgency   3d h/o dysuria, urgency, frequency. Some lower abdominal discomfort.  Denies blood in urine, fevers/chills, nausea/vomiting, flank pain.  She has not tried anything for this yet.  No recent antibiotics.   No recent UTI in the past.   Relevant past medical, surgical, family and social history reviewed and updated as indicated. Interim medical history since our last visit reviewed. Allergies and medications reviewed and updated. Outpatient Medications Prior to Visit  Medication Sig Dispense Refill  . aspirin (ASPIRIN EC) 81 MG EC tablet Take 81 mg by mouth daily. Swallow whole.    . fish oil-omega-3 fatty acids 1000 MG capsule Take 1 g by mouth daily.    Marland Kitchen ibuprofen (ADVIL,MOTRIN) 200 MG tablet Take 600 mg by mouth as needed.     Marland Kitchen levothyroxine (SYNTHROID, LEVOTHROID) 112 MCG tablet TAKE ONE TABLET BY MOUTH ONCE DAILY BEFORE BREAKFAST 90 tablet 1  . losartan (COZAAR) 50 MG tablet TAKE 1 TABLET BY MOUTH DAILY**NEEDS AN ANNUAL EXAM 90 tablet 3  . metoprolol tartrate (LOPRESSOR) 25 MG tablet TAKE 1 TABLET BY MOUTH ONCE DAILY 90 tablet 1  . Multiple Vitamins-Minerals (ALIVE WOMENS ENERGY PO) Take 1 tablet by mouth daily.      Marland Kitchen omeprazole (PRILOSEC) 40 MG capsule TAKE 1 CAPSULE BY MOUTH ONCE DAILY 90 capsule 1  . traZODone (DESYREL) 100 MG tablet Take 1 tablet (100 mg total) by mouth at bedtime. 90 tablet 3  . losartan (COZAAR) 50 MG tablet Take 1 tablet (50 mg total) by mouth daily. Needs an annual exam 90 tablet 0  . predniSONE (STERAPRED UNI-PAK 21 TAB) 5  MG (21) TBPK tablet Take as directed for 6 days 21 tablet 0   No facility-administered medications prior to visit.      Per HPI unless specifically indicated in ROS section below Review of Systems     Objective:    BP 116/64 (BP Location: Left Arm, Patient Position: Sitting, Cuff Size: Normal)   Pulse 74   Temp 98.5 F (36.9 C) (Oral)   Wt 188 lb (85.3 kg)   SpO2 97%   BMI 32.78 kg/m   Wt Readings from Last 3 Encounters:  07/25/17 188 lb (85.3 kg)  02/11/17 178 lb 12 oz (81.1 kg)  02/07/17 179 lb 12 oz (81.5 kg)    Physical Exam  Constitutional: She appears well-developed and well-nourished. No distress.  Abdominal: Soft. Normal appearance and bowel sounds are normal. She exhibits no distension and no mass. There is no tenderness. There is no rebound, no guarding and no CVA tenderness.  Nursing note and vitals reviewed.  Results for orders placed or performed in visit on 07/25/17  POCT Urinalysis Dipstick (Automated)  Result Value Ref Range   Color, UA yellow    Clarity, UA cloudy    Glucose, UA negative    Bilirubin, UA negative    Ketones, UA negative    Spec Grav, UA >=1.030 (A) 1.010 - 1.025   Blood, UA 2+  pH, UA 5.0 5.0 - 8.0   Protein, UA trace    Urobilinogen, UA 0.2 0.2 or 1.0 E.U./dL   Nitrite, UA negative    Leukocytes, UA Large (3+) (A) Negative      Assessment & Plan:   Problem List Items Addressed This Visit    UTI (urinary tract infection) - Primary    Story/exam consistent with acute cystitis. Treat with 7d keflex course. Update if not improving with treatment. Pt agrees with plan.       Relevant Medications   cephALEXin (KEFLEX) 500 MG capsule    Other Visit Diagnoses    Dysuria       Relevant Orders   POCT Urinalysis Dipstick (Automated) (Completed)       Follow up plan: Return if symptoms worsen or fail to improve.  Ria Bush, MD

## 2017-07-25 NOTE — Patient Instructions (Addendum)
You have UTI - treat with 7 days of keflex antibiotic twice daily.   Urinary Tract Infection, Adult A urinary tract infection (UTI) is an infection of any part of the urinary tract. The urinary tract includes the:  Kidneys.  Ureters.  Bladder.  Urethra.  These organs make, store, and get rid of pee (urine) in the body. Follow these instructions at home:  Take over-the-counter and prescription medicines only as told by your doctor.  If you were prescribed an antibiotic medicine, take it as told by your doctor. Do not stop taking the antibiotic even if you start to feel better.  Avoid the following drinks: ? Alcohol. ? Caffeine. ? Tea. ? Carbonated drinks.  Drink enough fluid to keep your pee clear or pale yellow.  Keep all follow-up visits as told by your doctor. This is important.  Make sure to: ? Empty your bladder often and completely. Do not to hold pee for long periods of time. ? Empty your bladder before and after sex. ? Wipe from front to back after a bowel movement if you are female. Use each tissue one time when you wipe. Contact a doctor if:  You have back pain.  You have a fever.  You feel sick to your stomach (nauseous).  You throw up (vomit).  Your symptoms do not get better after 3 days.  Your symptoms go away and then come back. Get help right away if:  You have very bad back pain.  You have very bad lower belly (abdominal) pain.  You are throwing up and cannot keep down any medicines or water. This information is not intended to replace advice given to you by your health care provider. Make sure you discuss any questions you have with your health care provider. Document Released: 02/12/2008 Document Revised: 02/01/2016 Document Reviewed: 07/17/2015 Elsevier Interactive Patient Education  [employer].

## 2017-07-25 NOTE — Assessment & Plan Note (Signed)
Story/exam consistent with acute cystitis. Treat with 7d keflex course. Update if not improving with treatment. Pt agrees with plan.

## 2017-07-29 DIAGNOSIS — M5136 Other intervertebral disc degeneration, lumbar region: Secondary | ICD-10-CM | POA: Diagnosis not present

## 2017-08-08 ENCOUNTER — Other Ambulatory Visit: Payer: Self-pay | Admitting: Family Medicine

## 2017-08-15 ENCOUNTER — Other Ambulatory Visit: Payer: Self-pay | Admitting: Family Medicine

## 2017-08-15 DIAGNOSIS — Z1231 Encounter for screening mammogram for malignant neoplasm of breast: Secondary | ICD-10-CM

## 2017-09-10 ENCOUNTER — Ambulatory Visit
Admission: RE | Admit: 2017-09-10 | Discharge: 2017-09-10 | Disposition: A | Discharge status: Home / Self Care | Payer: Medicare Other | Source: Ambulatory Visit | Attending: Family Medicine | Admitting: Family Medicine

## 2017-09-10 DIAGNOSIS — Z1231 Encounter for screening mammogram for malignant neoplasm of breast: Secondary | ICD-10-CM | POA: Diagnosis not present

## 2017-09-10 LAB — HM MAMMOGRAPHY

## 2017-09-11 ENCOUNTER — Encounter: Payer: Self-pay | Admitting: Family Medicine

## 2017-09-30 ENCOUNTER — Other Ambulatory Visit: Payer: Self-pay

## 2017-09-30 ENCOUNTER — Encounter (HOSPITAL_COMMUNITY): Payer: Self-pay | Admitting: Emergency Medicine

## 2017-09-30 ENCOUNTER — Ambulatory Visit (HOSPITAL_COMMUNITY)
Admission: EM | Admit: 2017-09-30 | Discharge: 2017-09-30 | Disposition: A | Discharge status: Home / Self Care | Payer: Medicare Other | Attending: Family Medicine | Admitting: Family Medicine

## 2017-09-30 DIAGNOSIS — J019 Acute sinusitis, unspecified: Secondary | ICD-10-CM | POA: Diagnosis not present

## 2017-09-30 DIAGNOSIS — Z7982 Long term (current) use of aspirin: Secondary | ICD-10-CM | POA: Diagnosis not present

## 2017-09-30 DIAGNOSIS — G4733 Obstructive sleep apnea (adult) (pediatric): Secondary | ICD-10-CM | POA: Insufficient documentation

## 2017-09-30 DIAGNOSIS — Z885 Allergy status to narcotic agent status: Secondary | ICD-10-CM | POA: Diagnosis not present

## 2017-09-30 DIAGNOSIS — Z79899 Other long term (current) drug therapy: Secondary | ICD-10-CM | POA: Insufficient documentation

## 2017-09-30 DIAGNOSIS — I10 Essential (primary) hypertension: Secondary | ICD-10-CM | POA: Diagnosis not present

## 2017-09-30 DIAGNOSIS — K219 Gastro-esophageal reflux disease without esophagitis: Secondary | ICD-10-CM | POA: Insufficient documentation

## 2017-09-30 DIAGNOSIS — N390 Urinary tract infection, site not specified: Secondary | ICD-10-CM

## 2017-09-30 DIAGNOSIS — R300 Dysuria: Secondary | ICD-10-CM | POA: Diagnosis present

## 2017-09-30 LAB — POCT URINALYSIS DIP (DEVICE)
BILIRUBIN URINE: NEGATIVE
GLUCOSE, UA: NEGATIVE mg/dL
KETONES UR: NEGATIVE mg/dL
Nitrite: NEGATIVE
Protein, ur: 100 mg/dL — AB
SPECIFIC GRAVITY, URINE: 1.02 (ref 1.005–1.030)
Urobilinogen, UA: 0.2 mg/dL (ref 0.0–1.0)
pH: 5.5 (ref 5.0–8.0)

## 2017-09-30 MED ORDER — AMOXICILLIN-POT CLAVULANATE 875-125 MG PO TABS: 1.0000 | ORAL_TABLET | Freq: Two times a day (BID) | ORAL | 0 refills | Status: DC

## 2017-09-30 MED ORDER — PHENAZOPYRIDINE HCL 200 MG PO TABS: 200.0000 mg | ORAL_TABLET | Freq: Three times a day (TID) | ORAL | 0 refills | Status: DC

## 2017-09-30 NOTE — ED Triage Notes (Signed)
Head cold symptoms started 2 weeks ago and getting worse.  Ears are stuffy, blowing green from nose, no fever.  Spouse is recovering from pneumonia.    Patient has had uti symptoms since yesterday.  Symptoms include foul smell to urine and burning with urination and pain in right flank area.

## 2017-09-30 NOTE — ED Provider Notes (Signed)
[place]    CSN: [id number] Arrival date & time: 09/30/17  1059     History   Chief Complaint Chief Complaint  Patient presents with  . URI  . Urinary Tract Infection    HPI Shanaiya Bene is a 62 y.o. female.   62 year old female, with history of GERD, hypertension, migraines, presenting today with 2 complaints.  First complaint is of possible sinusitis.  She states that she has had 1.5-2 weeks of nasal congestion and sinus pressure.  She states that both her grandson and her husband have been sick recently.  She is complaining of some pressure in her bilateral ears.  Denies any associated cough, fever or chills Patient is also complaining of dysuria.  States that she has had some foul-smelling urine as well as urinary frequency and urgency that started yesterday.  Has had some mild right-sided abdominal pain that started today.  She denies any hematuria, fever, chills, nausea or vomiting.   The history is provided by the patient.  URI  Presenting symptoms: congestion, facial pain and rhinorrhea   Presenting symptoms: no cough, no ear pain, no fever and no sore throat   Severity:  Moderate Onset quality:  Gradual Duration:  2 weeks Timing:  Constant Progression:  Worsening Chronicity:  Recurrent Relieved by:  Nothing Worsened by:  Nothing Ineffective treatments:  None tried Associated symptoms: sinus pain   Associated symptoms: no arthralgias, no headaches, no myalgias, no neck pain, no sneezing, no swollen glands and no wheezing   Risk factors: sick contacts   Risk factors: not elderly, no chronic cardiac disease, no chronic kidney disease, no chronic respiratory disease, no diabetes mellitus, no immunosuppression, no recent illness and no recent travel   Urinary Tract Infection  Pain quality:  Burning Pain severity:  Moderate Onset quality:  Gradual Duration:  2 days Timing:  Constant Progression:  Unchanged Chronicity:  New Recent urinary tract  infections: no   Relieved by:  Nothing Worsened by:  Nothing Ineffective treatments:  None tried Urinary symptoms: foul-smelling urine and frequent urination   Urinary symptoms: no discolored urine, no hematuria, no hesitancy and no bladder incontinence   Associated symptoms: abdominal pain   Associated symptoms: no fever, no flank pain, no genital lesions, no nausea, no vaginal discharge and no vomiting   Risk factors: no hx of pyelonephritis, no hx of urolithiasis, no kidney transplant, not pregnant, no recurrent urinary tract infections, no renal cysts, no renal disease, not sexually active, no sexually transmitted infections, no single kidney and no urinary catheter     Past Medical History:  Diagnosis Date  . Arthritis    neck pain and numbness left digits Vertell Limber)  . Chronic lower back pain    persistent after L1 cmp fx, with multilevel mild lumbar DDD - good resolution after L L5/S1 ESI ([place])  . Colon polyps    last colonoscopy 2006? rpt due  . Diverticulitis   . Fatty liver 02/2012   on CT scan 02/2012  . GERD (gastroesophageal reflux disease)   . History of diverticulitis of colon    s/p resection of large intestine, rpt itis 02/2012  . HTN (hypertension)   . Hypothyroid   . Migraines    occasional, stress related  . OSA (obstructive sleep apnea) 09/2016   mild to moderate, consider CPAP  . Postmenopausal    HRT compound, previously premarin  . Seasonal allergies   . Vertebral compression fracture ([facility]) 07/2012   L1 25% Vertell Limber) released without  intervention    Patient Active Problem List   Diagnosis Date Noted  . Impacted cerumen of right ear 02/11/2017  . Vision changes 02/11/2017  . Acute right-sided thoracic back pain 08/03/2016  . RUQ abdominal pain 08/03/2016  . Palpitations 07/10/2016  . Fatigue due to sleep pattern disturbance 07/10/2016  . Dyspnea on exertion 07/10/2016  . Advanced care planning/counseling discussion 09/29/2015  . Insomnia 03/08/2014  .  UTI (urinary tract infection) 09/28/2013  . Obesity, Class I, BMI 30-34.9 07/21/2013  . Sacroiliitis ([place]) 04/26/2013  . Compression fracture of L1 lumbar vertebra ([place]) 08/11/2012  . Colon polyp 09/26/2011  . Medicare annual wellness visit, subsequent 09/26/2011  . Hearing loss 09/26/2011  . Left knee pain 09/16/2011  . Cervical spondylosis with myelopathy 09/16/2011  . Hyperlipidemia with target LDL less than 100 06/03/2011  . Hot flashes 04/29/2011  . Depression   . GERD (gastroesophageal reflux disease)   . Essential hypertension   . Migraines   . OSA (obstructive sleep apnea)   . Hypothyroidism     Past Surgical History:  Procedure Laterality Date  . ANTERIOR FUSION CERVICAL SPINE  01/1999   cervical HNP  . COLON RESECTION  2008   2 ft removed, diverticulitis  . COLONOSCOPY  03/2012   partial colectomy, scattered diverticulae [name], rpt 10 yrs  . ESI Left 06/2013, 12/2013, 09/2014   L5/S1 with good resolution of pain  . ESOPHAGOGASTRODUODENOSCOPY  03/2012   mod gastritis, benign polyps, H pylori neg  . FOOT SURGERY Left 01/2014   big toe fusion  . KNEE SURGERY  1995, 2011   torn menisci (MRI 2010 L)  . SPIROMETRY  2011   normal  . TONSILLECTOMY  1964  . TOTAL ABDOMINAL HYSTERECTOMY W/ BILATERAL SALPINGOOPHORECTOMY  06/2009   vag bleeding  . US ECHOCARDIOGRAPHY  08/2010   normal, EF 63%, mild valvular issues    OB History    Gravida Para Term Preterm AB Living   2 2 2     2    SAB TAB Ectopic Multiple Live Births           2       Home Medications    Prior to Admission medications   Medication Sig Start Date End Date Taking? Authorizing Provider  amoxicillin-clavulanate (AUGMENTIN) 875-125 MG tablet Take 1 tablet by mouth every 12 (twelve) hours. 09/30/17   Cherylann Hobday C, PA-C  aspirin (ASPIRIN EC) 81 MG EC tablet Take 81 mg by mouth daily. Swallow whole.    [provider]  cephALEXin (KEFLEX) 500 MG capsule Take 1 capsule (500 mg total) 2 (two)  times daily by mouth. 07/25/17   Ria Bush, MD  fish oil-omega-3 fatty acids 1000 MG capsule Take 1 g by mouth daily.    [provider]  ibuprofen (ADVIL,MOTRIN) 200 MG tablet Take 600 mg by mouth as needed.     [provider]  levothyroxine (SYNTHROID, LEVOTHROID) 112 MCG tablet TAKE 1 TABLET BY MOUTH ONCE DAILY BEFORE BREAKFAST 08/08/17   Ria Bush, MD  losartan (COZAAR) 50 MG tablet TAKE 1 TABLET BY MOUTH DAILY**NEEDS AN ANNUAL EXAM 06/16/17   Ria Bush, MD  metoprolol tartrate (LOPRESSOR) 25 MG tablet TAKE 1 TABLET BY MOUTH ONCE DAILY 05/14/17   Ria Bush, MD  Multiple Vitamins-Minerals (ALIVE WOMENS ENERGY PO) Take 1 tablet by mouth daily.      [provider]  omeprazole (PRILOSEC) 40 MG capsule TAKE 1 CAPSULE BY MOUTH ONCE DAILY 04/08/17  Ria Bush, MD  phenazopyridine (PYRIDIUM) 200 MG tablet Take 1 tablet (200 mg total) by mouth 3 (three) times daily. 09/30/17   Ruqayya Ventress C, PA-C  traZODone (DESYREL) 100 MG tablet Take 1 tablet (100 mg total) by mouth at bedtime. 03/11/17   Ria Bush, MD    Family History Family History  Problem Relation Age of Onset  . Coronary artery disease Mother 27       CABG and valve replacement  . Arthritis Mother   . Valvular heart disease Mother        Had valve replacement  . Coronary artery disease Father 53       CAD/MI  . Hypertension Father   . Heart disease Father   . Diabetes Sister   . Hypertension Sister   . Hypertension Brother   . Coronary artery disease Brother        Stent  . Coronary artery disease Brother        Stents  . Hypertension Brother   . Hypertension Sister   . Mitral valve prolapse Sister   . Hypertension Sister   . Thyroid disease Sister   . Cancer Neg Hx     Social History Social History   Tobacco Use  . Smoking status: Never Smoker  . Smokeless tobacco: Never Used  Substance Use Topics  . Alcohol use: Yes    Comment: rare  . Drug  use: No     Allergies   Cymbalta [duloxetine hcl] and Percocet [oxycodone-acetaminophen]   Review of Systems Review of Systems  Constitutional: Negative for chills and fever.  HENT: Positive for congestion, rhinorrhea, sinus pressure and sinus pain. Negative for ear pain, sneezing and sore throat.   Eyes: Negative for pain and visual disturbance.  Respiratory: Negative for cough, shortness of breath and wheezing.   Cardiovascular: Negative for chest pain and palpitations.  Gastrointestinal: Positive for abdominal pain. Negative for nausea and vomiting.  Genitourinary: Positive for dysuria, frequency and urgency. Negative for flank pain, hematuria and vaginal discharge.  Musculoskeletal: Negative for arthralgias, back pain, myalgias and neck pain.  Skin: Negative for color change and rash.  Neurological: Negative for seizures, syncope and headaches.  All other systems reviewed and are negative.    Physical Exam Triage Vital Signs ED Triage Vitals  Enc Vitals Group     BP 09/30/17 1120 126/78     Pulse Rate 09/30/17 1120 72     Resp 09/30/17 1120 20     Temp 09/30/17 1120 98.3 F (36.8 C)     Temp Source 09/30/17 1120 Oral     SpO2 09/30/17 1120 98 %     Weight --      Height --      Head Circumference --      Peak Flow --      Pain Score 09/30/17 1118 4     Pain Loc --      Pain Edu? --      Excl. in [place]? --    No data found.  Updated Vital Signs BP 126/78 (BP Location: Right Arm)   Pulse 72   Temp 98.3 F (36.8 C) (Oral)   Resp 20   SpO2 98%   Visual Acuity Right Eye Distance:   Left Eye Distance:   Bilateral Distance:    Right Eye Near:   Left Eye Near:    Bilateral Near:     Physical Exam  Constitutional: She appears well-developed and well-nourished. No distress.  HENT:  Head: Normocephalic and atraumatic.  Right Ear: Hearing, external ear and ear canal normal. A middle ear effusion is present.  Left Ear: Hearing, external ear and ear canal  normal. A middle ear effusion is present.  Nose: Right sinus exhibits maxillary sinus tenderness. Left sinus exhibits maxillary sinus tenderness.  Mouth/Throat: Oropharynx is clear and moist. No oropharyngeal exudate, posterior oropharyngeal edema, posterior oropharyngeal erythema or tonsillar abscesses.  Eyes: Conjunctivae are normal.  Neck: Neck supple.  Cardiovascular: Normal rate and regular rhythm.  No murmur heard. Pulmonary/Chest: Effort normal and breath sounds normal. No stridor. No respiratory distress. She has no wheezes. She has no rhonchi. She has no rales.  Abdominal: Soft. There is no tenderness.  No abdominal or CVA tenderness  Musculoskeletal: She exhibits no edema.  Neurological: She is alert.  Skin: Skin is warm and dry.  Psychiatric: She has a normal mood and affect.  Nursing note and vitals reviewed.    UC Treatments / Results  Labs (all labs ordered are listed, but only abnormal results are displayed) Labs Reviewed  POCT URINALYSIS DIP (DEVICE) - Abnormal; Notable for the following components:      Result Value   Hgb urine dipstick LARGE (*)    Protein, ur 100 (*)    Leukocytes, UA MODERATE (*)    All other components within normal limits  URINE CULTURE    EKG  EKG Interpretation None       Radiology No results found.  Procedures Procedures (including critical care time)  Medications Ordered in UC Medications - No data to display   Initial Impression / Assessment and Plan / UC Course  I have reviewed the triage vital signs and the nursing notes.  Pertinent labs & imaging results that were available during my care of the patient were reviewed by me and considered in my medical decision making (see chart for details).     Will treat for sinusitis and UTI with augmentin  Final Clinical Impressions(s) / UC Diagnoses   Final diagnoses:  Acute sinusitis, recurrence not specified, unspecified location  Acute UTI    ED Discharge Orders         Ordered    amoxicillin-clavulanate (AUGMENTIN) 875-125 MG tablet  Every 12 hours     09/30/17 1136    phenazopyridine (PYRIDIUM) 200 MG tablet  3 times daily     09/30/17 1136       Controlled Substance Prescriptions [place] Controlled Substance Registry consulted? Not Applicable   Phebe Colla, [place] 09/30/17 1137

## 2017-10-02 LAB — URINE CULTURE: Culture: >=100000 — AB

## 2017-10-07 ENCOUNTER — Encounter: Payer: Self-pay | Admitting: Family Medicine

## 2017-10-08 MED ORDER — AMOXICILLIN-POT CLAVULANATE 875-125 MG PO TABS: 1.0000 | ORAL_TABLET | Freq: Two times a day (BID) | ORAL | 0 refills | Status: DC

## 2017-10-11 ENCOUNTER — Other Ambulatory Visit: Payer: Self-pay | Admitting: Family Medicine

## 2017-10-14 NOTE — Telephone Encounter (Signed)
Last OV (acute): 07/25/17 Last Annual Exam: 02/07/17 Next Annual Exam: 02/19/18  Last Refill #90, 1 ref on 04/08/17  Will refill #90 , 1 refill to last through annual exam appt in June.  Okay per protocol.

## 2017-11-11 ENCOUNTER — Telehealth: Payer: Self-pay

## 2017-11-11 DIAGNOSIS — E039 Hypothyroidism, unspecified: Secondary | ICD-10-CM

## 2017-11-11 NOTE — Telephone Encounter (Addendum)
Received faxed Need for Clarification from [facility] asking to change manufacturer for levothyroxine 150 mcg.   Placed in Dr. Synthia Innocent box.

## 2017-11-12 NOTE — Telephone Encounter (Signed)
Ok to change - pt needs to come in 6 wks after taking new formulary for thyroid check. Labs ordered.  

## 2017-11-12 NOTE — Telephone Encounter (Signed)
Spoke with [employer] notifying them Dr. Darnell Level is ok with the change.  Says they will make note.   Spoke with pt notifying her of the change.  Also, informed her to call and schedule a 6 wk lab visit after starting new rx.  Pt verbalizes understanding.

## 2018-01-08 ENCOUNTER — Encounter: Payer: Self-pay | Admitting: Family Medicine

## 2018-01-09 MED ORDER — SULFAMETHOXAZOLE-TRIMETHOPRIM 800-160 MG PO TABS: 1.0000 | ORAL_TABLET | Freq: Two times a day (BID) | ORAL | 0 refills | Status: DC

## 2018-01-25 ENCOUNTER — Other Ambulatory Visit: Payer: Self-pay | Admitting: Family Medicine

## 2018-01-27 DIAGNOSIS — M5136 Other intervertebral disc degeneration, lumbar region: Secondary | ICD-10-CM | POA: Diagnosis not present

## 2018-02-12 ENCOUNTER — Other Ambulatory Visit: Payer: Self-pay | Admitting: Family Medicine

## 2018-02-12 ENCOUNTER — Ambulatory Visit: Payer: Medicare Other

## 2018-02-12 ENCOUNTER — Other Ambulatory Visit (INDEPENDENT_AMBULATORY_CARE_PROVIDER_SITE_OTHER): Payer: Medicare Other

## 2018-02-12 ENCOUNTER — Ambulatory Visit (INDEPENDENT_AMBULATORY_CARE_PROVIDER_SITE_OTHER): Payer: Medicare Other

## 2018-02-12 VITALS — BP 112/78 | HR 71 | Temp 98.9°F | Ht 63.5 in | Wt 185.5 lb

## 2018-02-12 DIAGNOSIS — R5383 Other fatigue: Secondary | ICD-10-CM

## 2018-02-12 DIAGNOSIS — E785 Hyperlipidemia, unspecified: Secondary | ICD-10-CM

## 2018-02-12 DIAGNOSIS — E039 Hypothyroidism, unspecified: Secondary | ICD-10-CM

## 2018-02-12 DIAGNOSIS — Z0000 Encounter for general adult medical examination without abnormal findings: Secondary | ICD-10-CM

## 2018-02-12 LAB — LIPID PANEL
CHOLESTEROL: 226 mg/dL — AB (ref 0–200)
HDL: 44.4 mg/dL (ref >39.00)
LDL Cholesterol: 153 mg/dL — ABNORMAL HIGH (ref 0–99)
NonHDL: 181.77
Total CHOL/HDL Ratio: 5
Triglycerides: 144 mg/dL (ref 0.0–149.0)
VLDL: 28.8 mg/dL (ref 0.0–40.0)

## 2018-02-12 LAB — COMPREHENSIVE METABOLIC PANEL
ALK PHOS: 59 U/L (ref 39–117)
ALT: 11 U/L (ref 0–35)
AST: 17 U/L (ref 0–37)
Albumin: 4.1 g/dL (ref 3.5–5.2)
BUN: 13 mg/dL (ref 6–23)
CO2: 29 mEq/L (ref 19–32)
CREATININE: 0.95 mg/dL (ref 0.40–1.20)
Calcium: 9.1 mg/dL (ref 8.4–10.5)
Chloride: 103 mEq/L (ref 96–112)
GFR: 63.16 mL/min (ref >60.00)
Glucose, Bld: 99 mg/dL (ref 70–99)
POTASSIUM: 4.4 meq/L (ref 3.5–5.1)
SODIUM: 138 meq/L (ref 135–145)
TOTAL PROTEIN: 6.7 g/dL (ref 6.0–8.3)
Total Bilirubin: 0.5 mg/dL (ref 0.2–1.2)

## 2018-02-12 LAB — T4, FREE: FREE T4: 1.34 ng/dL (ref 0.60–1.60)

## 2018-02-12 LAB — VITAMIN B12: VITAMIN B 12: 255 pg/mL (ref 211–911)

## 2018-02-12 LAB — TSH: TSH: 0.26 u[IU]/mL — ABNORMAL LOW (ref 0.35–4.50)

## 2018-02-12 NOTE — Patient Instructions (Addendum)
Ms. Cavendish , Thank you for taking time to come for your Medicare Wellness Visit. I appreciate your ongoing commitment to your health goals. Please review the following plan we discussed and let me know if I can assist you in the future.   These are the goals we discussed: Goals    . Weight (lb) < 151 lb (68.5 kg)     Starting 02/12/2018, I will continue to use food dairy to monitor intake in an effort to lose weight.        This is a list of the screening recommended for you and due dates:  Health Maintenance  Topic Date Due  . Pap Smear  02/07/2026*  . Flu Shot  04/09/2018  . Mammogram  09/10/2018  . DTaP/Tdap/Td vaccine (2 - Td) 09/25/2021  . Tetanus Vaccine  09/25/2021  . Colon Cancer Screening  04/01/2022  .  Hepatitis C: One time screening is recommended by Center for Disease Control  (CDC) for  adults born from [birth year] through 1966.   Completed  . HIV Screening  Completed  *Topic was postponed. The date shown is not the original due date.      Preventive Care for Adults  A healthy lifestyle and preventive care can promote health and wellness. Preventive health guidelines for adults include the following key practices.  . A routine yearly physical is a good way to check with your health care provider about your health and preventive screening. It is a chance to share any concerns and updates on your health and to receive a thorough exam.  . Visit your dentist for a routine exam and preventive care every 6 months. Brush your teeth twice a day and floss once a day. Good oral hygiene prevents tooth decay and gum disease.  . The frequency of eye exams is based on your age, health, family medical history, use  of contact lenses, and other factors. Follow your health care provider's recommendations for frequency of eye exams.  . Eat a healthy diet. Foods like vegetables, fruits, whole grains, low-fat dairy products, and lean protein foods contain the nutrients you need without too  many calories. Decrease your intake of foods high in solid fats, added sugars, and salt. Eat the right amount of calories for you. Get information about a proper diet from your health care provider, if necessary.  . Regular physical exercise is one of the most important things you can do for your health. Most adults should get at least 150 minutes of moderate-intensity exercise (any activity that increases your heart rate and causes you to sweat) each week. In addition, most adults need muscle-strengthening exercises on 2 or more days a week.  Silver Sneakers may be a benefit available to you. To determine eligibility, you may visit the website: www.silversneakers.com or contact program at 810 742 2488 Mon-Fri between 8AM-8PM.   . Maintain a healthy weight. The body mass index (BMI) is a screening tool to identify possible weight problems. It provides an estimate of body fat based on height and weight. Your health care provider can find your BMI and can help you achieve or maintain a healthy weight.   For adults 20 years and older: ? A BMI below 18.5 is considered underweight. ? A BMI of 18.5 to 24.9 is normal. ? A BMI of 25 to 29.9 is considered overweight. ? A BMI of 30 and above is considered obese.   . Maintain normal blood lipids and cholesterol levels by exercising and minimizing your intake of  saturated fat. Eat a balanced diet with plenty of fruit and vegetables. Blood tests for lipids and cholesterol should begin at age 68 and be repeated every 5 years. If your lipid or cholesterol levels are high, you are over 50, or you are at high risk for heart disease, you may need your cholesterol levels checked more frequently. Ongoing high lipid and cholesterol levels should be treated with medicines if diet and exercise are not working.  . If you smoke, find out from your health care provider how to quit. If you do not use tobacco, please do not start.  . If you choose to drink alcohol, please  do not consume more than 2 drinks per day. One drink is considered to be 12 ounces (355 mL) of beer, 5 ounces (148 mL) of wine, or 1.5 ounces (44 mL) of liquor.  . If you are 65-43 years old, ask your health care provider if you should take aspirin to prevent strokes.  . Use sunscreen. Apply sunscreen liberally and repeatedly throughout the day. You should seek shade when your shadow is shorter than you. Protect yourself by wearing long sleeves, pants, a wide-brimmed hat, and sunglasses year round, whenever you are outdoors.  . Once a month, do a whole body skin exam, using a mirror to look at the skin on your back. Tell your health care provider of new moles, moles that have irregular borders, moles that are larger than a pencil eraser, or moles that have changed in shape or color.

## 2018-02-13 ENCOUNTER — Other Ambulatory Visit: Payer: Self-pay

## 2018-02-13 MED ORDER — LEVOTHYROXINE SODIUM 112 MCG PO TABS: ORAL_TABLET | ORAL | 1 refills | Status: DC

## 2018-02-13 NOTE — Progress Notes (Signed)
Subjective:   Carole Doner is a 62 y.o. female who presents for Medicare Annual (Subsequent) preventive examination.  Review of Systems:  N/A Cardiac Risk Factors include: obesity (BMI >30kg/m2);dyslipidemia;hypertension     Objective:     Vitals: BP 112/78 (BP Location: Right Arm, Patient Position: Sitting, Cuff Size: Normal)   Pulse 71   Temp 98.9 F (37.2 C) (Oral)   Ht 5' 3.5" (1.613 m) Comment: no shoes  Wt 185 lb 8 oz (84.1 kg)   SpO2 97%   BMI 32.34 kg/m   Body mass index is 32.34 kg/m.  Advanced Directives 02/12/2018 02/07/2017 08/28/2016  Does Patient Have a Medical Advance Directive? No No No  Would patient like information on creating a medical advance directive? No - Patient declined - Yes (MAU/Ambulatory/Procedural Areas - Information given)    Tobacco Social History   Tobacco Use  Smoking Status Never Smoker  Smokeless Tobacco Never Used     Counseling given: No   Clinical Intake:  Pre-visit preparation completed: Yes  Pain Score: 4 (back, knees, neck)     Nutritional Status: BMI > 30  Obese Nutritional Risks: None Diabetes: No  How often do you need to have someone help you when you read instructions, pamphlets, or other written materials from your doctor or pharmacy?: 1 - Never What is the last grade level you completed in school?: 12th grade  Interpreter Needed?: No  Comments: pt lives with spouse Information entered by :: LPinson, LPN  Past Medical History:  Diagnosis Date  . Arthritis    neck pain and numbness left digits Vertell Limber)  . Chronic lower back pain    persistent after L1 cmp fx, with multilevel mild lumbar DDD - good resolution after L L5/S1 ESI ([place])  . Colon polyps    last colonoscopy 2006? rpt due  . Diverticulitis   . Fatty liver 02/2012   on CT scan 02/2012  . GERD (gastroesophageal reflux disease)   . History of diverticulitis of colon    s/p resection of large intestine, rpt itis 02/2012  . HTN (hypertension)     . Hypothyroid   . Migraines    occasional, stress related  . OSA (obstructive sleep apnea) 09/2016   mild to moderate, consider CPAP  . Postmenopausal    HRT compound, previously premarin  . Seasonal allergies   . Vertebral compression fracture ([place]) 07/2012   L1 25% Vertell Limber) released without intervention   Past Surgical History:  Procedure Laterality Date  . ANTERIOR FUSION CERVICAL SPINE  01/1999   cervical HNP  . COLON RESECTION  2008   2 ft removed, diverticulitis  . COLONOSCOPY  03/2012   partial colectomy, scattered diverticulae [name], rpt 10 yrs  . ESI Left 06/2013, 12/2013, 09/2014   L5/S1 with good resolution of pain  . ESOPHAGOGASTRODUODENOSCOPY  03/2012   mod gastritis, benign polyps, H pylori neg  . FOOT SURGERY Left 01/2014   big toe fusion  . KNEE SURGERY  1995, 2011   torn menisci (MRI 2010 L)  . SPIROMETRY  2011   normal  . TONSILLECTOMY  1964  . TOTAL ABDOMINAL HYSTERECTOMY W/ BILATERAL SALPINGOOPHORECTOMY  06/2009   vag bleeding  . US ECHOCARDIOGRAPHY  08/2010   normal, EF 63%, mild valvular issues   Family History  Problem Relation Age of Onset  . Coronary artery disease Mother 45       CABG and valve replacement  . Arthritis Mother   . Valvular heart disease Mother  Had valve replacement  . Coronary artery disease Father 85       CAD/MI  . Hypertension Father   . Heart disease Father   . Diabetes Sister   . Hypertension Sister   . Hypertension Brother   . Coronary artery disease Brother        Stent  . Coronary artery disease Brother        Stents  . Hypertension Brother   . Hypertension Sister   . Mitral valve prolapse Sister   . Hypertension Sister   . Thyroid disease Sister   . Cancer Neg Hx    Social History   Socioeconomic History  . Marital status: Married    Spouse name: Not on file  . Number of children: 2  . Years of education: HS  . Highest education level: Not on file  Occupational History    Employer: NOT  EMPLOYED  Social Needs  . Financial resource strain: Not on file  . Food insecurity:    Worry: Not on file    Inability: Not on file  . Transportation needs:    Medical: Not on file    Non-medical: Not on file  Tobacco Use  . Smoking status: Never Smoker  . Smokeless tobacco: Never Used  Substance and Sexual Activity  . Alcohol use: Yes    Comment: rare  . Drug use: No  . Sexual activity: Yes    Birth control/protection: Surgical  Lifestyle  . Physical activity:    Days per week: Not on file    Minutes per session: Not on file  . Stress: Not on file  Relationships  . Social connections:    Talks on phone: Not on file    Gets together: Not on file    Attends religious service: Not on file    Active member of club or organization: Not on file    Attends meetings of clubs or organizations: Not on file    Relationship status: Not on file  Other Topics Concern  . Not on file  Social History Narrative   Caffeine: 2-3 cups/day (1-2 coffee); occasional beer   Lives with husband and 45yo son, 4 dogs   Activity: no scheduled - enjoys walking, but just usually run around doing shopping etc. Does not do routine exercise.   Diet: lots of water, fruits/vegetable    Outpatient Encounter Medications as of 02/12/2018  Medication Sig  . aspirin (ASPIRIN EC) 81 MG EC tablet Take 81 mg by mouth daily. Swallow whole.  . fish oil-omega-3 fatty acids 1000 MG capsule Take 1 g by mouth daily.  Marland Kitchen ibuprofen (ADVIL,MOTRIN) 200 MG tablet Take 600 mg by mouth as needed.   Marland Kitchen levothyroxine (SYNTHROID, LEVOTHROID) 112 MCG tablet TAKE 1 TABLET BY MOUTH ONCE DAILY BEFORE BREAKFAST  . losartan (COZAAR) 50 MG tablet TAKE 1 TABLET BY MOUTH DAILY**NEEDS AN ANNUAL EXAM  . metoprolol tartrate (LOPRESSOR) 25 MG tablet TAKE 1 TABLET BY MOUTH ONCE DAILY  . Multiple Vitamins-Minerals (ALIVE WOMENS ENERGY PO) Take 1 tablet by mouth daily.    Marland Kitchen omeprazole (PRILOSEC) 40 MG capsule TAKE 1 CAPSULE BY MOUTH ONCE DAILY    . traZODone (DESYREL) 100 MG tablet Take 1 tablet (100 mg total) by mouth at bedtime.  . [DISCONTINUED] amoxicillin-clavulanate (AUGMENTIN) 875-125 MG tablet Take 1 tablet by mouth every 12 (twelve) hours.  . [DISCONTINUED] cephALEXin (KEFLEX) 500 MG capsule Take 1 capsule (500 mg total) 2 (two) times daily by mouth.  . [DISCONTINUED] phenazopyridine (  PYRIDIUM) 200 MG tablet Take 1 tablet (200 mg total) by mouth 3 (three) times daily.  . [DISCONTINUED] sulfamethoxazole-trimethoprim (BACTRIM DS,SEPTRA DS) 800-160 MG tablet Take 1 tablet by mouth 2 (two) times daily.   No facility-administered encounter medications on file as of 02/12/2018.     Activities of Daily Living In your present state of health, do you have any difficulty performing the following activities: 02/12/2018  Hearing? N  Vision? N  Difficulty concentrating or making decisions? N  Walking or climbing stairs? Y  Dressing or bathing? N  Doing errands, shopping? N  Preparing Food and eating ? N  Using the Toilet? N  In the past six months, have you accidently leaked urine? N  Do you have problems with loss of bowel control? N  Managing your Medications? N  Managing your Finances? N  Housekeeping or managing your Housekeeping? N  Some recent data might be hidden    Patient Care Team: Ria Bush, MD as PCP - General (Family Medicine)    Assessment:   This is a routine wellness examination for Adelena.   Hearing Screening   125Hz  250Hz  500Hz  1000Hz  2000Hz  3000Hz  4000Hz  6000Hz  8000Hz   Right ear:   40 40 40  40    Left ear:   40 40 40  40    Vision Screening Comments: Vision exam in 2018 @ [facility]    Exercise Activities and Dietary recommendations Current Exercise Habits: The patient does not participate in regular exercise at present, Exercise limited by: orthopedic condition(s)  Goals    . Weight (lb) < 151 lb (68.5 kg)     Starting 02/12/2018, I will continue to use food dairy to monitor intake in an  effort to lose weight.        Fall Risk Fall Risk  02/12/2018 02/07/2017 09/29/2015 03/08/2014  Falls in the past year? No No No No   Depression Screen PHQ 2/9 Scores 02/12/2018 02/07/2017 09/29/2015 03/08/2014  PHQ - 2 Score 1 0 0 0  PHQ- 9 Score 7 - - -     Cognitive Function MMSE - Mini Mental State Exam 02/12/2018 02/07/2017  Orientation to time 5 5  Orientation to Place 5 5  Registration 3 3  Attention/ Calculation 0 0  Recall 2 3  Recall-comments unable to recall 1 of 3 words -  Language- name 2 objects 0 0  Language- repeat 1 1  Language- follow 3 step command 3 3  Language- read & follow direction 0 0  Write a sentence 0 0  Copy design 0 0  Total score 19 20     PLEASE NOTE: A Mini-Cog screen was completed. Maximum score is 20. A value of 0 denotes this part of Folstein MMSE was not completed or the patient failed this part of the Mini-Cog screening.   Mini-Cog Screening Orientation to Time - Max 5 pts Orientation to Place - Max 5 pts Registration - Max 3 pts Recall - Max 3 pts Language Repeat - Max 1 pts Language Follow 3 Step Command - Max 3 pts    Immunization History  Administered Date(s) Administered  . Influenza Split 09/26/2011  . Influenza,inj,Quad PF,6+ Mos 06/12/2016  . Influenza-Unspecified 06/10/2015, 06/27/2017  . Tdap 09/26/2011  . Zoster 06/12/2016    Screening Tests Health Maintenance  Topic Date Due  . PAP SMEAR  02/07/2026 (Originally 02/29/1977)  . INFLUENZA VACCINE  04/09/2018  . MAMMOGRAM  09/10/2018  . DTaP/Tdap/Td (2 - Td) 09/25/2021  . TETANUS/TDAP  09/25/2021  . COLONOSCOPY  04/01/2022  . Hepatitis C Screening  Completed  . HIV Screening  Completed       Plan:     I have personally reviewed, addressed, and noted the following in the patient's chart:  A. Medical and social history B. Use of alcohol, tobacco or illicit drugs  C. Current medications and supplements D. Functional ability and status E.  Nutritional status F.    Physical activity G. Advance directives H. List of other physicians I.  Hospitalizations, surgeries, and ER visits in previous 12 months J.  [place] to include hearing, vision, cognitive, depression L. Referrals and appointments - none  In addition, I have reviewed and discussed with patient certain preventive protocols, quality metrics, and best practice recommendations. A written personalized care plan for preventive services as well as general preventive health recommendations were provided to patient.  See attached scanned questionnaire for additional information.   Signed,   Lindell Noe, MHA, BS, LPN Health Coach

## 2018-02-13 NOTE — Progress Notes (Signed)
PCP notes:   Health maintenance:  No gaps identified.  Abnormal screenings:   Depression score: 7 Depression screen [facility] 2/9 02/12/2018 02/07/2017 09/29/2015 03/08/2014  Decreased Interest 1 0 0 0  Down, Depressed, Hopeless 0 0 0 0  PHQ - 2 Score 1 0 0 0  Altered sleeping 2 - - -  Tired, decreased energy 3 - - -  Change in appetite 0 - - -  Feeling bad or failure about yourself  1 - - -  Trouble concentrating 0 - - -  Moving slowly or fidgety/restless 0 - - -  Suicidal thoughts 0 - - -  PHQ-9 Score 7 - - -  Difficult doing work/chores Somewhat difficult - - -   Mini-Cog score: 19/20 MMSE - Mini Mental State Exam 02/12/2018 02/07/2017  Orientation to time 5 5  Orientation to Place 5 5  Registration 3 3  Attention/ Calculation 0 0  Recall 2 3  Recall-comments unable to recall 1 of 3 words -  Language- name 2 objects 0 0  Language- repeat 1 1  Language- follow 3 step command 3 3  Language- read & follow direction 0 0  Write a sentence 0 0  Copy design 0 0  Total score 19 20   Patient concerns:   Intermittent numbness in both hands. PCP notified.   Medication refill request. PCP notified.   Referral to Dr. Vinnie Level Miller/[facility] for hormone management. Patient is unable to afford [facility] services at this time. Patient verbalized hormonal imbalance is affecting her quality of life.   Nurse concerns:  None  Next PCP appt:   6/13/20198 @ 1200

## 2018-02-13 NOTE — Telephone Encounter (Signed)
Refill request made during AWV on 02/12/18 for Levothyroxine.

## 2018-02-16 NOTE — Progress Notes (Signed)
I reviewed health advisor's note, was available for consultation, and agree with documentation and plan.  

## 2018-02-19 ENCOUNTER — Encounter: Payer: Self-pay | Admitting: Family Medicine

## 2018-02-19 ENCOUNTER — Ambulatory Visit (INDEPENDENT_AMBULATORY_CARE_PROVIDER_SITE_OTHER): Payer: Medicare Other | Admitting: Family Medicine

## 2018-02-19 VITALS — BP 120/70 | HR 66 | Temp 98.1°F | Ht 63.5 in | Wt 186.2 lb

## 2018-02-19 DIAGNOSIS — M545 Low back pain, unspecified: Secondary | ICD-10-CM

## 2018-02-19 DIAGNOSIS — E538 Deficiency of other specified B group vitamins: Secondary | ICD-10-CM | POA: Diagnosis not present

## 2018-02-19 DIAGNOSIS — Z7189 Other specified counseling: Secondary | ICD-10-CM | POA: Diagnosis not present

## 2018-02-19 DIAGNOSIS — R200 Anesthesia of skin: Secondary | ICD-10-CM

## 2018-02-19 DIAGNOSIS — G4700 Insomnia, unspecified: Secondary | ICD-10-CM | POA: Diagnosis not present

## 2018-02-19 DIAGNOSIS — E669 Obesity, unspecified: Secondary | ICD-10-CM | POA: Diagnosis not present

## 2018-02-19 DIAGNOSIS — I10 Essential (primary) hypertension: Secondary | ICD-10-CM | POA: Diagnosis not present

## 2018-02-19 DIAGNOSIS — E66811 Obesity, class 1: Secondary | ICD-10-CM

## 2018-02-19 DIAGNOSIS — R232 Flushing: Secondary | ICD-10-CM

## 2018-02-19 DIAGNOSIS — E785 Hyperlipidemia, unspecified: Secondary | ICD-10-CM

## 2018-02-19 DIAGNOSIS — F329 Major depressive disorder, single episode, unspecified: Secondary | ICD-10-CM | POA: Diagnosis not present

## 2018-02-19 DIAGNOSIS — K219 Gastro-esophageal reflux disease without esophagitis: Secondary | ICD-10-CM | POA: Diagnosis not present

## 2018-02-19 DIAGNOSIS — S32010S Wedge compression fracture of first lumbar vertebra, sequela: Secondary | ICD-10-CM

## 2018-02-19 DIAGNOSIS — G8929 Other chronic pain: Secondary | ICD-10-CM

## 2018-02-19 DIAGNOSIS — E039 Hypothyroidism, unspecified: Secondary | ICD-10-CM

## 2018-02-19 DIAGNOSIS — F32A Depression, unspecified: Secondary | ICD-10-CM

## 2018-02-19 DIAGNOSIS — R7989 Other specified abnormal findings of blood chemistry: Secondary | ICD-10-CM

## 2018-02-19 MED ORDER — B-12 1000 MCG SL SUBL: 1.0000 | SUBLINGUAL_TABLET | Freq: Every day | SUBLINGUAL | Status: DC

## 2018-02-19 NOTE — Assessment & Plan Note (Addendum)
Not on statin. Strong fmhx CAD - likely LDL goal <100. Consider statin. She continues fish oil daily.  The 10-year ASCVD risk score Mikey Bussing DC Brooke Bonito., et al., 2013) is: 5.9%   Values used to calculate the score:     Age: 62 years     Sex: Female     Is Non-Hispanic African American: No     Diabetic: No     Tobacco smoker: No     Systolic Blood Pressure: 720 mmHg     Is BP treated: Yes     HDL Cholesterol: 44.4 mg/dL     Total Cholesterol: 226 mg/dL

## 2018-02-19 NOTE — Patient Instructions (Addendum)
If interested, check with pharmacy about new 2 shot shingles series (shingrix).  For hot flashes - check on black cohosh OTC supplement, I will look into other med option, may end up referral to GYN.  Work on advanced directive (packet provided today) Thyroid levels were elevated this time. Check on manufacturer of thyroid medicine - if new one, I recommend new dose to be 1 tablet daily, one day a week taking 1/2 tablet only. Return in 3 months for lab visit only - check thyroid levels.  Start vitamin b12 sublingual 1050mcg daily. Consider wrist brace for hand numbness. Let me know how hand numbness does  Return in 3 months for follow up visit.

## 2018-02-19 NOTE — Progress Notes (Signed)
BP 120/70 (BP Location: Left Arm, Patient Position: Sitting, Cuff Size: Normal)   Pulse 66   Temp 98.1 F (36.7 C) (Oral)   Ht 5' 3.5" (1.613 m)   Wt 186 lb 4 oz (84.5 kg)   SpO2 98%   BMI 32.48 kg/m    CC: AMW f/u Subjective:    Patient ID: Brittany [name], female    DOB: 11/16/1955, 62 y.o.   MRN: [id number]  HPI: Brittany [name] is a 62 y.o. female presenting on 02/19/2018 for Annual Exam (Pt 2.)   Saw Katha Cabal last week for medicare wellness visit. Note reviewed. Pt requests referral to Dr Sabra Heck for hormone management - unable to continue affording [facility] ($200 Q3 mo) - was taking estrogen pellets through [facility] - this was very helpful. Ongoing struggle with hot flashes, weight gain and mood trouble. Peri-menopause started early around age 27 yo. S/p hysterectomy with BSO 2010. She previously saw Dr Hulan Fray and tried testosterone cream QOD and estradiol 1mg  (no benefit), started on trazodone 100mg  QHS for sleep which has helped. SSRI was ineffective for hot flashes. Has not tried OTC supplements.   Ongoing lower back pain, last steroid shots 01/2018 ([place]).  Bilateral entire hands going numb when sleeping. This wakes her up at night. Very bothersome.   Fmhx CAD.   Preventative: COLONOSCOPY Date: 03/2012 partial colectomy, scattered diverticulae [name], rpt 10 yrs ESOPHAGOGASTRODUODENOSCOPY Date: 03/2012 mod gastritis, benign polyps, H pylori neg Well woman - last when done hysterectomy (benign reason). Doesn't need rpt pap. Ovaries removed (2010).  Mammogram 09/2017 WNL Flu shot - yearly Tdap - 09/2011 zostavax - 06/2016 shingrix - discussed Advanced directives: does not have done. Has handout at home. Thinks would want sister [name] to be HCPOA. Pt requests packet today.  Seat belt use discussed Sunscreen use discussed. No changing moles on skin.  Non smoker Alcohol - occasional beer on weekends Dentist - yearly Eye exam yearly  Caffeine: 2-3 cups/day (1-2  coffee)  Lives with husband and 25yo son, 4 dogs  Activity: yardwork, limited by back pain Diet: good water, fruits/vegetable daily   Relevant past medical, surgical, family and social history reviewed and updated as indicated. Interim medical history since our last visit reviewed. Allergies and medications reviewed and updated. Outpatient Medications Prior to Visit  Medication Sig Dispense Refill  . aspirin (ASPIRIN EC) 81 MG EC tablet Take 81 mg by mouth daily. Swallow whole.    . fish oil-omega-3 fatty acids 1000 MG capsule Take 1 g by mouth daily.    Marland Kitchen ibuprofen (ADVIL,MOTRIN) 200 MG tablet Take 600 mg by mouth as needed.     Marland Kitchen levothyroxine (SYNTHROID, LEVOTHROID) 112 MCG tablet TAKE 1 TABLET BY MOUTH ONCE DAILY BEFORE BREAKFAST 90 tablet 1  . losartan (COZAAR) 50 MG tablet TAKE 1 TABLET BY MOUTH DAILY**NEEDS AN ANNUAL EXAM 90 tablet 3  . metoprolol tartrate (LOPRESSOR) 25 MG tablet TAKE 1 TABLET BY MOUTH ONCE DAILY 90 tablet 0  . Multiple Vitamins-Minerals (ALIVE WOMENS ENERGY PO) Take 1 tablet by mouth daily.      Marland Kitchen omeprazole (PRILOSEC) 40 MG capsule TAKE 1 CAPSULE BY MOUTH ONCE DAILY 90 capsule 1  . traZODone (DESYREL) 100 MG tablet Take 1 tablet (100 mg total) by mouth at bedtime. 90 tablet 3   No facility-administered medications prior to visit.      Per HPI unless specifically indicated in ROS section below Review of Systems     Objective:  BP 120/70 (BP Location: Left Arm, Patient Position: Sitting, Cuff Size: Normal)   Pulse 66   Temp 98.1 F (36.7 C) (Oral)   Ht 5' 3.5" (1.613 m)   Wt 186 lb 4 oz (84.5 kg)   SpO2 98%   BMI 32.48 kg/m   Wt Readings from Last 3 Encounters:  02/19/18 186 lb 4 oz (84.5 kg)  02/12/18 185 lb 8 oz (84.1 kg)  07/25/17 188 lb (85.3 kg)    Physical Exam  Constitutional: She is oriented to person, place, and time. She appears well-developed and well-nourished. No distress.  HENT:  Head: Normocephalic and atraumatic.  Right Ear:  Hearing, tympanic membrane, external ear and ear canal normal.  Left Ear: Hearing, tympanic membrane, external ear and ear canal normal.  Nose: Nose normal.  Mouth/Throat: Uvula is midline, oropharynx is clear and moist and mucous membranes are normal. No oropharyngeal exudate, posterior oropharyngeal edema or posterior oropharyngeal erythema.  Eyes: Pupils are equal, round, and reactive to light. Conjunctivae and EOM are normal. No scleral icterus.  Neck: Normal range of motion. Neck supple.  Cardiovascular: Normal rate, regular rhythm, normal heart sounds and intact distal pulses.  No murmur heard. Pulses:      Radial pulses are 2+ on the right side, and 2+ on the left side.  Pulmonary/Chest: Effort normal and breath sounds normal. No respiratory distress. She has no wheezes. She has no rales.  Abdominal: Soft. Bowel sounds are normal. She exhibits no distension and no mass. There is no tenderness. There is no rebound and no guarding.  Musculoskeletal: Normal range of motion. She exhibits no edema.  2+ rad pulses bilaterally  Lymphadenopathy:    She has no cervical adenopathy.  Neurological: She is alert and oriented to person, place, and time.  CN grossly intact, station and gait intact Neg tinel and phalen  Skin: Skin is warm and dry. No rash noted.  Psychiatric: She has a normal mood and affect. Her behavior is normal. Judgment and thought content normal.  Nursing note and vitals reviewed.  Results for orders placed or performed in visit on 02/12/18  Vitamin B12  Result Value Ref Range   Vitamin B-12 255 211 - 911 pg/mL   Lab Results  Component Value Date   TSH 0.26 (L) 02/12/2018   T3TOTAL 100.0 08/06/2016    Lab Results  Component Value Date   CREATININE 0.95 02/12/2018   BUN 13 02/12/2018   NA 138 02/12/2018   K 4.4 02/12/2018   CL 103 02/12/2018   CO2 29 02/12/2018    Lab Results  Component Value Date   CHOL 226 (H) 02/12/2018   HDL 44.40 02/12/2018   LDLCALC  153 (H) 02/12/2018   LDLDIRECT 154.0 08/14/2012   TRIG 144.0 02/12/2018   CHOLHDL 5 02/12/2018    Depression screen PHQ 2/9 02/12/2018 02/07/2017 09/29/2015 03/08/2014  Decreased Interest 1 0 0 0  Down, Depressed, Hopeless 0 0 0 0  PHQ - 2 Score 1 0 0 0  Altered sleeping 2 - - -  Tired, decreased energy 3 - - -  Change in appetite 0 - - -  Feeling bad or failure about yourself  1 - - -  Trouble concentrating 0 - - -  Moving slowly or fidgety/restless 0 - - -  Suicidal thoughts 0 - - -  PHQ-9 Score 7 - - -  Difficult doing work/chores Somewhat difficult - - -       Assessment & Plan:  Problem List Items Addressed This Visit    Obesity, Class I, 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       Low vitamin B12 level    rec start 1000 mcg SL B12      Insomnia    On trazodone nightly for this. See plan for hot flashes.       Hypothyroidism    Pt states levothyroxine formulary changed - correlating to new low TSH. She will check home levothyroxine and if new formulary, will change to levothyroxine 175mcg daily with one day a week taking 1/2 tab only. Reassess control at 3 mo f/u visit.       Hyperlipidemia with target LDL less than 100 (Chronic)    Not on statin. Strong fmhx CAD - likely LDL goal <100. Consider statin. She continues fish oil daily.  The 10-year ASCVD risk score Mikey Bussing DC Brooke Bonito., et al., 2013) is: 5.9%   Values used to calculate the score:     Age: 69 years     Sex: Female     Is Non-Hispanic African American: No     Diabetic: No     Tobacco smoker: No     Systolic Blood Pressure: 885 mmHg     Is BP treated: Yes     HDL Cholesterol: 44.4 mg/dL     Total Cholesterol: 226 mg/dL       Hot flashes - Primary    celexa initially effective then lost effect despite increased dose. Oral estrogen, topical testosterone has not been effective. Estrogen pellets from [facility] were effective but  unaffordable. Will trial OTC black cohosh supplement. Will trial gabapentin as well. Consider effexorf.  If ineffective, pt requests referral to GYN, requests Dr Sabra Heck.  Discussed estrogenic property of black cohosh, discussed cardiovascular, breast cancer, DVT risk.       GERD (gastroesophageal reflux disease)    Continues daily PPI.       Essential hypertension (Chronic)    Chronic, stable. Continue current regimen.       Depression    Difficult year, stable off medication..       Compression fracture of L1 lumbar vertebra ([place])    H/o this 2013.  DEXA showed mild osteopenia.  Encouraged avoiding heavy lifting.       Chronic lumbar pain    S/p steroid injections last month ([place])      Bilateral hand numbness    ?CTS although negative tinel/phalen today.  Low B12 - start 1032mcg SL and assess effect. Encouraged using night braces at night. Consider NCS vs hand surgery eval.       Advanced care planning/counseling discussion    Advanced directives: does not have done. Has handout at home. Thinks would want sister [name] to be HCPOA. Pt requests packet today.           Meds ordered this encounter  Medications  . Cyanocobalamin (B-12) 1000 MCG SUBL    Sig: Place 1 tablet under the tongue daily.    Dispense:  30 each   No orders of the defined types were placed in this encounter.   Follow up plan: Return in about 3 months (around 05/22/2018) for follow up visit.  Ria Bush, MD

## 2018-02-21 ENCOUNTER — Telehealth: Payer: Self-pay | Admitting: Family Medicine

## 2018-02-21 DIAGNOSIS — E538 Deficiency of other specified B group vitamins: Secondary | ICD-10-CM | POA: Insufficient documentation

## 2018-02-21 DIAGNOSIS — R200 Anesthesia of skin: Secondary | ICD-10-CM | POA: Insufficient documentation

## 2018-02-21 DIAGNOSIS — M545 Low back pain, unspecified: Secondary | ICD-10-CM | POA: Insufficient documentation

## 2018-02-21 DIAGNOSIS — G8929 Other chronic pain: Secondary | ICD-10-CM | POA: Insufficient documentation

## 2018-02-21 MED ORDER — GABAPENTIN 300 MG PO CAPS: 300.0000 mg | ORAL_CAPSULE | Freq: Every day | ORAL | 3 refills | Status: DC

## 2018-02-21 NOTE — Assessment & Plan Note (Signed)
Difficult year, stable off medication.Marland Kitchen

## 2018-02-21 NOTE — Assessment & Plan Note (Signed)
Advanced directives: does not have done. Has handout at home. Thinks would want sister Brittany [name] to be HCPOA. Pt requests packet today.

## 2018-02-21 NOTE — Assessment & Plan Note (Signed)
Encouraged healthy diet and lifestyle choices to affect sustainable weight loss.  ?

## 2018-02-21 NOTE — Assessment & Plan Note (Signed)
rec start 1000 mcg SL B12

## 2018-02-21 NOTE — Assessment & Plan Note (Signed)
Pt states levothyroxine formulary changed - correlating to new low TSH. She will check home levothyroxine and if new formulary, will change to levothyroxine 168mcg daily with one day a week taking 1/2 tab only. Reassess control at 3 mo f/u visit.

## 2018-02-21 NOTE — Assessment & Plan Note (Addendum)
celexa initially effective then lost effect despite increased dose. Oral estrogen, topical testosterone has not been effective. Estrogen pellets from [facility] clinic were effective but unaffordable. Will trial OTC black cohosh supplement. Will trial gabapentin as well. Consider effexorf.  If ineffective, pt requests referral to GYN, requests Dr Sabra Heck.  Discussed estrogenic property of black cohosh, discussed cardiovascular, breast cancer, DVT risk.

## 2018-02-21 NOTE — Assessment & Plan Note (Signed)
Chronic, stable. Continue current regimen. 

## 2018-02-21 NOTE — Assessment & Plan Note (Addendum)
On trazodone nightly for this. See plan for hot flashes.

## 2018-02-21 NOTE — Assessment & Plan Note (Signed)
H/o this 2013.  DEXA showed mild osteopenia.  Encouraged avoiding heavy lifting.

## 2018-02-21 NOTE — Assessment & Plan Note (Signed)
?  CTS although negative tinel/phalen today.  Low B12 - start 1054mcg SL and assess effect. Encouraged using night braces at night. Consider NCS vs hand surgery eval.

## 2018-02-21 NOTE — Telephone Encounter (Signed)
plz call patient - I'd like her to trial gabapentin 300mg  nightly for hot flashes if not previously tried.  This may help back pain and sleep as well. If helping her sleep, she may be able to try taper off trazodone (1/2 tab for 1-2 wks then off) Update [place] with effect of gabapentin trial after 1 week. Sent to pharmacy.

## 2018-02-21 NOTE — Assessment & Plan Note (Signed)
S/p steroid injections last month ([place])

## 2018-02-21 NOTE — Assessment & Plan Note (Signed)
Continues daily PPI.  

## 2018-02-23 NOTE — Telephone Encounter (Signed)
Left message on vm per dpr relaying message and instructions per Dr. G. 

## 2018-03-19 ENCOUNTER — Other Ambulatory Visit: Payer: Self-pay | Admitting: Family Medicine

## 2018-03-22 ENCOUNTER — Other Ambulatory Visit: Payer: Self-pay | Admitting: Family Medicine

## 2018-04-20 ENCOUNTER — Encounter: Payer: Self-pay | Admitting: Family Medicine

## 2018-04-21 ENCOUNTER — Telehealth: Payer: Self-pay

## 2018-04-21 MED ORDER — OMEPRAZOLE 40 MG PO CPDR: 40.0000 mg | DELAYED_RELEASE_CAPSULE | Freq: Every day | ORAL | 1 refills | Status: DC

## 2018-04-21 NOTE — Telephone Encounter (Signed)
Received email form pt requesting refill for omeprazole [facility] to [facility] [place]. [See Pt Msg, 04/20/18.]  Refill sent. Pt notified via [place].

## 2018-04-27 ENCOUNTER — Other Ambulatory Visit: Payer: Self-pay | Admitting: Family Medicine

## 2018-04-27 ENCOUNTER — Telehealth: Payer: Self-pay | Admitting: Family Medicine

## 2018-04-27 MED ORDER — METOPROLOL TARTRATE 25 MG PO TABS: 25.0000 mg | ORAL_TABLET | Freq: Every day | ORAL | 0 refills | Status: DC

## 2018-04-27 NOTE — Telephone Encounter (Signed)
Copied from [place] [phone]. Topic: Quick Communication - Rx Refill/Question >> Apr 27, 2018  3:39 PM Mcneil, Ja-Kwan wrote: Medication: metoprolol tartrate (LOPRESSOR) 25 MG tablet  Has the patient contacted their pharmacy? no  Preferred Pharmacy (with phone number or street name): [id number] [place], [place] - [address] [phone] (Phone) [phone] (Fax)  Agent: Please be advised that RX refills may take up to 3 business days. We ask that you follow-up with your pharmacy.

## 2018-04-27 NOTE — Telephone Encounter (Signed)
Gabapentin Last rx:  6/15/419, #30;  Pharmacy asking for 90 days Next OV (CPE):  03/09/19

## 2018-04-27 NOTE — Telephone Encounter (Signed)
metoprolol refill Last Refill:01/26/18 # 90 Last OV: 02/19/18 PCP: Dr Danise Mina [phone]

## 2018-05-29 ENCOUNTER — Other Ambulatory Visit: Payer: Self-pay | Admitting: Family Medicine

## 2018-06-01 DIAGNOSIS — Z23 Encounter for immunization: Secondary | ICD-10-CM | POA: Diagnosis not present

## 2018-06-08 ENCOUNTER — Ambulatory Visit (INDEPENDENT_AMBULATORY_CARE_PROVIDER_SITE_OTHER): Payer: Medicare Other | Admitting: Family Medicine

## 2018-06-08 ENCOUNTER — Encounter: Payer: Self-pay | Admitting: Family Medicine

## 2018-06-08 VITALS — BP 120/70 | HR 80 | Temp 98.6°F | Ht 63.5 in | Wt 186.5 lb

## 2018-06-08 DIAGNOSIS — R3915 Urgency of urination: Secondary | ICD-10-CM

## 2018-06-08 LAB — POC URINALSYSI DIPSTICK (AUTOMATED)
Bilirubin, UA: NEGATIVE
Glucose, UA: NEGATIVE
Ketones, UA: NEGATIVE
NITRITE UA: NEGATIVE
PH UA: 6 (ref 5.0–8.0)
PROTEIN UA: POSITIVE — AB
Spec Grav, UA: 1.025 (ref 1.010–1.025)
Urobilinogen, UA: 0.2 E.U./dL

## 2018-06-08 MED ORDER — SULFAMETHOXAZOLE-TRIMETHOPRIM 800-160 MG PO TABS: 1.0000 | ORAL_TABLET | Freq: Two times a day (BID) | ORAL | 0 refills | Status: AC

## 2018-06-08 NOTE — Progress Notes (Signed)
Dr. Frederico Hamman T. Cresencio Reesor, MD, [facility] [place], [address] Phone: [phone] Fax: [phone]  06/08/2018  Patient: Brittany [name], MRN: [id number], DOB: Mar 27, 1956, 62 y.o.  Primary Physician:  Ria Bush, MD   Chief Complaint  Patient presents with  . Urinary Urgency   Subjective:   This 62 y.o. female patient presents with burning, urgency. No vaginal discharge or external irritation.  No STD exposure. No abd pain, no flank pain.  Current dysuria  The PMH, PSH, Social History, Family History, Medications, and allergies have been reviewed in [facility], and have been updated if relevant.  Patient Active Problem List   Diagnosis Date Noted  . Low vitamin B12 level 02/21/2018  . Bilateral hand numbness 02/21/2018  . Chronic lumbar pain 02/21/2018  . Palpitations 07/10/2016  . Fatigue due to sleep pattern disturbance 07/10/2016  . Dyspnea on exertion 07/10/2016  . Advanced care planning/counseling discussion 09/29/2015  . Insomnia 03/08/2014  . Obesity, Class I, BMI 30-34.9 07/21/2013  . Sacroiliitis ([place]) 04/26/2013  . Compression fracture of L1 lumbar vertebra ([place]) 08/11/2012  . Colon polyp 09/26/2011  . Medicare annual wellness visit, subsequent 09/26/2011  . Hearing loss 09/26/2011  . Cervical spondylosis with myelopathy 09/16/2011  . Hyperlipidemia with target LDL less than 100 06/03/2011  . Hot flashes 04/29/2011  . Depression   . GERD (gastroesophageal reflux disease)   . Essential hypertension   . Migraines   . OSA (obstructive sleep apnea)   . Hypothyroidism     Past Medical History:  Diagnosis Date  . Arthritis    neck pain and numbness left digits Vertell Limber)  . Chronic lower back pain    persistent after L1 cmp fx, with multilevel mild lumbar DDD - good resolution after L L5/S1 ESI ([place])  . Colon polyps    last colonoscopy 2006? rpt due  . Diverticulitis   . Fatty liver 02/2012   on CT scan 02/2012  . GERD (gastroesophageal reflux disease)   . History of diverticulitis of colon    s/p resection of large intestine, rpt itis 02/2012  . HTN (hypertension)   . Hypothyroid   . Migraines    occasional, stress related  . OSA (obstructive sleep apnea) 09/2016   mild to moderate, consider CPAP  . Postmenopausal    HRT compound, previously premarin  . Seasonal allergies   . Vertebral compression fracture ([place]) 07/2012   L1 25% Vertell Limber) released without intervention    Past Surgical History:  Procedure Laterality Date  . ANTERIOR FUSION CERVICAL SPINE  01/1999   cervical HNP  . COLON RESECTION  2008   2 ft removed, diverticulitis  . COLONOSCOPY  03/2012   partial colectomy, scattered diverticulae [name], rpt 10 yrs  . ESI Left 06/2013, 12/2013, 09/2014   L5/S1 with good resolution of pain  . ESOPHAGOGASTRODUODENOSCOPY  03/2012   mod gastritis, benign polyps, H pylori neg  . FOOT SURGERY Left 01/2014   big toe fusion  . KNEE SURGERY  1995, 2011   torn menisci (MRI 2010 L)  . SPIROMETRY  2011   normal  . TONSILLECTOMY  1964  . TOTAL ABDOMINAL HYSTERECTOMY W/ BILATERAL SALPINGOOPHORECTOMY  06/2009   vag bleeding  . US ECHOCARDIOGRAPHY  08/2010   normal, EF 63%, mild valvular issues    Social History   Socioeconomic History  . Marital status: Married    Spouse name: Not on file  . Number of children: 2  .  Years of education: HS  . Highest education level: Not on file  Occupational History    Employer: NOT EMPLOYED  Social Needs  . Financial resource strain: Not on file  . Food insecurity:    Worry: Not on file    Inability: Not on file  . Transportation needs:    Medical: Not on file    Non-medical: Not on file  Tobacco Use  . Smoking status: Never Smoker  . Smokeless tobacco: Never Used  Substance and Sexual Activity  . Alcohol use: Yes    Comment: rare  . Drug use: No  . Sexual activity: Yes    Birth control/protection: Surgical  Lifestyle    . Physical activity:    Days per week: Not on file    Minutes per session: Not on file  . Stress: Not on file  Relationships  . Social connections:    Talks on phone: Not on file    Gets together: Not on file    Attends religious service: Not on file    Active member of club or organization: Not on file    Attends meetings of clubs or organizations: Not on file    Relationship status: Not on file  . Intimate partner violence:    Fear of current or ex partner: Not on file    Emotionally abused: Not on file    Physically abused: Not on file    Forced sexual activity: Not on file  Other Topics Concern  . Not on file  Social History Narrative   Caffeine: 2-3 cups/day (1-2 coffee); occasional beer   Lives with husband and 51yo son, 4 dogs   Activity: no scheduled - enjoys walking, but just usually run around doing shopping etc. Does not do routine exercise.   Diet: lots of water, fruits/vegetable    Family History  Problem Relation Age of Onset  . Coronary artery disease Mother 64       CABG and valve replacement  . Arthritis Mother   . Valvular heart disease Mother        Had valve replacement  . Coronary artery disease Father 64       CAD/MI  . Hypertension Father   . Heart disease Father   . Diabetes Sister   . Hypertension Sister   . Hypertension Brother   . Coronary artery disease Brother        Stent  . Coronary artery disease Brother        Stents  . Hypertension Brother   . Hypertension Sister   . Mitral valve prolapse Sister   . Hypertension Sister   . Thyroid disease Sister   . Cancer Neg Hx     Allergies  Allergen Reactions  . Cymbalta [Duloxetine Hcl] Other (See Comments)    suicidality  . Percocet [Oxycodone-Acetaminophen] Other (See Comments)    Nauseated, dizzy    Medication list reviewed and updated in full in [place].  GEN:  no fevers, chills. GI: No n/v/d, eating normally Otherwise, ROS is as per the HPI.  Objective:   Blood  pressure 120/70, pulse 80, temperature 98.6 F (37 C), temperature source Oral, height 5' 3.5" (1.613 m), weight 186 lb 8 oz (84.6 kg).  GEN: WDWN, A&Ox4,NAD. Non-toxic HEENT: Atraumatc, normocephalic. CV: RRR, No M/G/R PULM: CTA B, No wheezes, crackles, or rhonchi ABD: S, NT, ND, +BS, no rebound. No CVAT. No suprapubic tenderness. EXT: No c/c/e  Objective Data: Results for orders placed or performed  in visit on 06/08/18  POCT Urinalysis Dipstick (Automated)  Result Value Ref Range   Color, UA yellow    Clarity, UA cloudy    Glucose, UA Negative Negative   Bilirubin, UA Negative    Ketones, UA Negative    Spec Grav, UA 1.025 1.010 - 1.025   Blood, UA Large    pH, UA 6.0 5.0 - 8.0   Protein, UA Positive (A) Negative   Urobilinogen, UA 0.2 0.2 or 1.0 E.U./dL   Nitrite, UA Negative    Leukocytes, UA Large (3+) (A) Negative    Assessment and Plan:   Urinary urgency - Plan: POCT Urinalysis Dipstick (Automated), Urine Culture  Rx with ABX as below. Drink plenty of fluids and supportive care.  Follow-up: No follow-ups on file.  Orders Placed This Encounter  Procedures  . Urine Culture  . POCT Urinalysis Dipstick (Automated)    Signed,  Jahkai Yandell T. Rossi Burdo, MD   Patient's Medications  New Prescriptions   SULFAMETHOXAZOLE-TRIMETHOPRIM (BACTRIM DS,SEPTRA DS) 800-160 MG TABLET    Take 1 tablet by mouth 2 (two) times daily for 7 days.  Previous Medications   ASPIRIN (ASPIRIN EC) 81 MG EC TABLET    Take 81 mg by mouth daily. Swallow whole.   CYANOCOBALAMIN (B-12) 1000 MCG SUBL    Place 1 tablet under the tongue daily.   FISH OIL-OMEGA-3 FATTY ACIDS 1000 MG CAPSULE    Take 1 g by mouth daily.   GABAPENTIN (NEURONTIN) 300 MG CAPSULE    TAKE 1 CAPSULE (300 MG TOTAL) BY MOUTH AT BEDTIME. FOR HOT FLASHES   IBUPROFEN (ADVIL,MOTRIN) 200 MG TABLET    Take 600 mg by mouth as needed.    LEVOTHYROXINE (SYNTHROID, LEVOTHROID) 112 MCG TABLET    TAKE 1 TABLET BY MOUTH ONCE DAILY BEFORE  BREAKFAST   LOSARTAN (COZAAR) 50 MG TABLET    TAKE 1 TABLET BY MOUTH DAILY**NEEDS AN ANNUAL EXAM   METOPROLOL TARTRATE (LOPRESSOR) 25 MG TABLET    TAKE 1 TABLET BY MOUTH EVERY DAY   MULTIPLE VITAMINS-MINERALS (ALIVE WOMENS ENERGY PO)    Take 1 tablet by mouth daily.     OMEPRAZOLE (PRILOSEC) 40 MG CAPSULE    Take 1 capsule (40 mg total) by mouth daily.   TRAZODONE (DESYREL) 100 MG TABLET    TAKE 1 TABLET BY MOUTH AT BEDTIME  Modified Medications   No medications on file  Discontinued Medications   No medications on file

## 2018-06-09 LAB — URINE CULTURE
MICRO NUMBER: 91171303
SPECIMEN QUALITY:: ADEQUATE

## 2018-06-19 ENCOUNTER — Other Ambulatory Visit: Payer: Self-pay | Admitting: Family Medicine

## 2018-07-11 ENCOUNTER — Other Ambulatory Visit: Payer: Self-pay | Admitting: Family Medicine

## 2018-08-10 ENCOUNTER — Other Ambulatory Visit: Payer: Self-pay | Admitting: Family Medicine

## 2018-08-11 ENCOUNTER — Ambulatory Visit (INDEPENDENT_AMBULATORY_CARE_PROVIDER_SITE_OTHER): Payer: Medicare Other | Admitting: Family Medicine

## 2018-08-11 ENCOUNTER — Encounter: Payer: Self-pay | Admitting: Family Medicine

## 2018-08-11 VITALS — BP 120/68 | HR 72 | Temp 98.8°F | Ht 63.5 in | Wt 184.5 lb

## 2018-08-11 DIAGNOSIS — J0110 Acute frontal sinusitis, unspecified: Secondary | ICD-10-CM

## 2018-08-11 MED ORDER — AMOXICILLIN-POT CLAVULANATE 875-125 MG PO TABS: 1.0000 | ORAL_TABLET | Freq: Two times a day (BID) | ORAL | 0 refills | Status: AC

## 2018-08-11 NOTE — Progress Notes (Signed)
BP 120/68   Pulse 72   Temp 98.8 F (37.1 C) (Oral)   Ht 5' 3.5" (1.613 m)   Wt 184 lb 8 oz (83.7 kg)   SpO2 96%   BMI 32.17 kg/m    CC: sinusitis Subjective:    Patient ID: Brittany [name], female    DOB: March 21, 1956, 62 y.o.   MRN: [id number]  HPI: Brittany [name] is a 62 y.o. female presenting on 08/11/2018 for head congestion (greem amd dry cpigj)   1 + wk h/o nasal congestion, productive cough in am. Ear congestion. Frontal headache. Significant PNDrainage. Head > chest congestion. Fatigue.   Treating with dayquil, nyquil, flonase, benadryl and EmergenC and motrin.  Sick contacts at home.  No h/o asthma Non smoker  Newborn granddaughter at home - she is going to start taking care of this week.    Relevant past medical, surgical, family and social history reviewed and updated as indicated. Interim medical history since our last visit reviewed. Allergies and medications reviewed and updated. Outpatient Medications Prior to Visit  Medication Sig Dispense Refill  . aspirin (ASPIRIN EC) 81 MG EC tablet Take 81 mg by mouth daily. Swallow whole.    . Cyanocobalamin (B-12) 1000 MCG SUBL Place 1 tablet under the tongue daily. 30 each   . fish oil-omega-3 fatty acids 1000 MG capsule Take 1 g by mouth daily.    Marland Kitchen gabapentin (NEURONTIN) 300 MG capsule TAKE 1 CAPSULE (300 MG TOTAL) BY MOUTH AT BEDTIME. FOR HOT FLASHES 90 capsule 2  . ibuprofen (ADVIL,MOTRIN) 200 MG tablet Take 600 mg by mouth as needed.     Marland Kitchen losartan (COZAAR) 50 MG tablet TAKE 1 TABLET BY MOUTH DAILY**NEEDS AN ANNUAL EXAM 90 tablet 0  . metoprolol tartrate (LOPRESSOR) 25 MG tablet TAKE 1 TABLET BY MOUTH EVERY DAY 90 tablet 2  . Multiple Vitamins-Minerals (ALIVE WOMENS ENERGY PO) Take 1 tablet by mouth daily.      Marland Kitchen omeprazole (PRILOSEC) 40 MG capsule Take 1 capsule (40 mg total) by mouth daily. 90 capsule 1  . traZODone (DESYREL) 100 MG tablet TAKE 1 TABLET BY MOUTH AT BEDTIME 90 tablet 3  . levothyroxine (SYNTHROID,  LEVOTHROID) 112 MCG tablet TAKE 1 TABLET BY MOUTH ONCE DAILY BEFORE BREAKFAST 90 tablet 1   No facility-administered medications prior to visit.      Per HPI unless specifically indicated in ROS section below Review of Systems     Objective:    BP 120/68   Pulse 72   Temp 98.8 F (37.1 C) (Oral)   Ht 5' 3.5" (1.613 m)   Wt 184 lb 8 oz (83.7 kg)   SpO2 96%   BMI 32.17 kg/m   Wt Readings from Last 3 Encounters:  08/11/18 184 lb 8 oz (83.7 kg)  06/08/18 186 lb 8 oz (84.6 kg)  02/19/18 186 lb 4 oz (84.5 kg)    Physical Exam  Constitutional: She appears well-developed and well-nourished. No distress.  HENT:  Head: Normocephalic and atraumatic.  Right Ear: Hearing, tympanic membrane, external ear and ear canal normal.  Left Ear: Hearing, tympanic membrane, external ear and ear canal normal.  Nose: Mucosal edema present. No rhinorrhea. Right sinus exhibits no maxillary sinus tenderness and no frontal sinus tenderness. Left sinus exhibits frontal sinus tenderness. Left sinus exhibits no maxillary sinus tenderness.  Mouth/Throat: Uvula is midline, oropharynx is clear and moist and mucous membranes are normal. No oropharyngeal exudate, posterior oropharyngeal edema, posterior oropharyngeal erythema or tonsillar abscesses.  Eyes: Pupils are equal, round, and reactive to light. Conjunctivae and EOM are normal. No scleral icterus.  Neck: Normal range of motion. Neck supple.  Cardiovascular: Normal rate, regular rhythm, normal heart sounds and intact distal pulses.  No murmur heard. Pulmonary/Chest: Effort normal and breath sounds normal. No respiratory distress. She has no wheezes. She has no rales.  Lymphadenopathy:    She has no cervical adenopathy.  Skin: Skin is warm and dry. No rash noted.  Nursing note and vitals reviewed.     Assessment & Plan:   Problem List Items Addressed This Visit    Acute sinusitis - Primary    Cover for bacterial infection given duration, progression,  with augmentin course. She will also start caring for 58mo grandchild at home.       Relevant Medications   amoxicillin-clavulanate (AUGMENTIN) 875-125 MG tablet       Meds ordered this encounter  Medications  . amoxicillin-clavulanate (AUGMENTIN) 875-125 MG tablet    Sig: Take 1 tablet by mouth 2 (two) times daily for 10 days.    Dispense:  20 tablet    Refill:  0   No orders of the defined types were placed in this encounter.   Follow up plan: Return if symptoms worsen or fail to improve.  Ria Bush, MD

## 2018-08-11 NOTE — Patient Instructions (Signed)
You have a sinus infection. Take medicine as prescribed: augmentin 10 day course  Push fluids and plenty of rest. Nasal saline irrigation or neti pot to help drain sinuses. May use plain mucinex with plenty of fluid to help mobilize mucous. Please let us know if fever >101.5, trouble opening/closing mouth, difficulty swallowing, or worsening instead of improving as expected.  

## 2018-08-11 NOTE — Assessment & Plan Note (Signed)
Cover for bacterial infection given duration, progression, with augmentin course. She will also start caring for 78mo grandchild at home.

## 2018-08-26 ENCOUNTER — Other Ambulatory Visit: Payer: Self-pay | Admitting: Family Medicine

## 2018-08-26 DIAGNOSIS — Z1231 Encounter for screening mammogram for malignant neoplasm of breast: Secondary | ICD-10-CM

## 2018-09-17 ENCOUNTER — Ambulatory Visit (INDEPENDENT_AMBULATORY_CARE_PROVIDER_SITE_OTHER): Payer: Medicare Other | Admitting: Family Medicine

## 2018-09-17 ENCOUNTER — Encounter: Payer: Self-pay | Admitting: Family Medicine

## 2018-09-17 VITALS — BP 120/76 | HR 76 | Temp 98.2°F | Ht 63.5 in | Wt 179.8 lb

## 2018-09-17 DIAGNOSIS — R197 Diarrhea, unspecified: Secondary | ICD-10-CM | POA: Insufficient documentation

## 2018-09-17 DIAGNOSIS — J0191 Acute recurrent sinusitis, unspecified: Secondary | ICD-10-CM

## 2018-09-17 MED ORDER — TRAZODONE HCL 100 MG PO TABS: 100.0000 mg | ORAL_TABLET | Freq: Every day | ORAL | 3 refills | Status: DC

## 2018-09-17 MED ORDER — LOSARTAN POTASSIUM 50 MG PO TABS: 50.0000 mg | ORAL_TABLET | Freq: Every day | ORAL | 3 refills | Status: DC

## 2018-09-17 MED ORDER — GABAPENTIN 300 MG PO CAPS: 300.0000 mg | ORAL_CAPSULE | Freq: Every day | ORAL | 3 refills | Status: DC

## 2018-09-17 MED ORDER — METOPROLOL TARTRATE 25 MG PO TABS: 25.0000 mg | ORAL_TABLET | Freq: Every day | ORAL | 3 refills | Status: DC

## 2018-09-17 MED ORDER — OMEPRAZOLE 40 MG PO CPDR: 40.0000 mg | DELAYED_RELEASE_CAPSULE | Freq: Every day | ORAL | 3 refills | Status: DC

## 2018-09-17 MED ORDER — LEVOTHYROXINE SODIUM 112 MCG PO TABS: 112.0000 ug | ORAL_TABLET | Freq: Every day | ORAL | 1 refills | Status: DC

## 2018-09-17 NOTE — Progress Notes (Signed)
BP 120/76 (BP Location: Left Arm, Patient Position: Sitting, Cuff Size: Normal)   Pulse 76   Temp 98.2 F (36.8 C) (Oral)   Ht 5' 3.5" (1.613 m)   Wt 179 lb 12 oz (81.5 kg)   SpO2 96%   BMI 31.34 kg/m    CC: cough Subjective:    Patient ID: Brittany [name], female    DOB: 1956/08/29, 63 y.o.   MRN: [id number]  HPI: Brittany [name] is a 63 y.o. female presenting on 09/17/2018 for Cough (C/o cough, nasal congestion, drainage and diarrhea. Was about 6 wks ago for similar sxs. Was treated, improved and sxs returned about 1 wk after finising meds. Tried OTC cold meds, not helping. ) and Medication Refill (Pt has new insurance and now uses [facility]. Needs new 90-day rxs. )   See prior note for details. Seen here early December 2019 with dx sinusitis treated with 10d augmentin course. Initially did improve but symptoms started returning 1 wk after finishing antibiotic course. Now noticing ongoing cough, nasal congestion and drainage. Increasing fatigue. Sore throat worse at night time. No body aches.   Now over the last 2 weeks noticing increasing trouble with diarrhea associated with urgency leading to bowel accidents. This happens for a few days, then normal stools for a few days. Up to 8 times at night, can be watery and explosive at night. Not nearly as bad during the day. Imodium is only temporarily beneficial. Describes brown loose soft stools. Stool is not greasy. Not watery diarrhea, no blood or mucous in stool. Has lost 10 lbs. No diet changes. No stool yesterday.   Taking dayquil/nyquil, robitussin CF.      Relevant past medical, surgical, family and social history reviewed and updated as indicated. Interim medical history since our last visit reviewed. Allergies and medications reviewed and updated. Outpatient Medications Prior to Visit  Medication Sig Dispense Refill  . aspirin (ASPIRIN EC) 81 MG EC tablet Take 81 mg by mouth daily. Swallow whole.    . Cyanocobalamin (B-12) 1000 MCG  SUBL Place 1 tablet under the tongue daily. 30 each   . fish oil-omega-3 fatty acids 1000 MG capsule Take 1 g by mouth daily.    Marland Kitchen ibuprofen (ADVIL,MOTRIN) 200 MG tablet Take 600 mg by mouth as needed.     . Multiple Vitamins-Minerals (ALIVE WOMENS ENERGY PO) Take 1 tablet by mouth daily.      Marland Kitchen gabapentin (NEURONTIN) 300 MG capsule TAKE 1 CAPSULE (300 MG TOTAL) BY MOUTH AT BEDTIME. FOR HOT FLASHES 90 capsule 2  . levothyroxine (SYNTHROID, LEVOTHROID) 112 MCG tablet TAKE 1 TABLET BY MOUTH ONCE DAILY BEFORE BREAKFAST 90 tablet 1  . losartan (COZAAR) 50 MG tablet TAKE 1 TABLET BY MOUTH DAILY**NEEDS AN ANNUAL EXAM 90 tablet 0  . metoprolol tartrate (LOPRESSOR) 25 MG tablet TAKE 1 TABLET BY MOUTH EVERY DAY 90 tablet 2  . omeprazole (PRILOSEC) 40 MG capsule Take 1 capsule (40 mg total) by mouth daily. 90 capsule 1  . traZODone (DESYREL) 100 MG tablet TAKE 1 TABLET BY MOUTH AT BEDTIME 90 tablet 3   No facility-administered medications prior to visit.      Per HPI unless specifically indicated in ROS section below Review of Systems Objective:    BP 120/76 (BP Location: Left Arm, Patient Position: Sitting, Cuff Size: Normal)   Pulse 76   Temp 98.2 F (36.8 C) (Oral)   Ht 5' 3.5" (1.613 m)   Wt 179 lb 12 oz (81.5 kg)  SpO2 96%   BMI 31.34 kg/m   Wt Readings from Last 3 Encounters:  09/17/18 179 lb 12 oz (81.5 kg)  08/11/18 184 lb 8 oz (83.7 kg)  06/08/18 186 lb 8 oz (84.6 kg)    Physical Exam Vitals signs and nursing note reviewed.  Constitutional:      General: She is not in acute distress.    Appearance: Normal appearance. She is well-developed.  HENT:     Head: Normocephalic and atraumatic.     Right Ear: Hearing, tympanic membrane, ear canal and external ear normal.     Left Ear: Hearing, tympanic membrane, ear canal and external ear normal.     Ears:     Comments: Cerumen removed from R canal    Nose: Congestion (L>R nasal mucosal congestion) and rhinorrhea present. No mucosal  edema.     Right Sinus: No maxillary sinus tenderness or frontal sinus tenderness.     Left Sinus: No maxillary sinus tenderness or frontal sinus tenderness.     Mouth/Throat:     Pharynx: Uvula midline. No oropharyngeal exudate or posterior oropharyngeal erythema.     Tonsils: No tonsillar abscesses.  Eyes:     General: No scleral icterus.    Conjunctiva/sclera: Conjunctivae normal.     Pupils: Pupils are equal, round, and reactive to light.  Neck:     Musculoskeletal: Normal range of motion and neck supple.  Cardiovascular:     Rate and Rhythm: Normal rate and regular rhythm.     Pulses: Normal pulses.     Heart sounds: Normal heart sounds. No murmur.  Pulmonary:     Effort: Pulmonary effort is normal. No respiratory distress.     Breath sounds: Normal breath sounds. No wheezing or rales.  Abdominal:     General: Abdomen is flat. Bowel sounds are normal. There is no distension.     Palpations: Abdomen is soft. There is no mass.     Tenderness: There is no abdominal tenderness. There is no right CVA tenderness, left CVA tenderness, guarding or rebound.     Hernia: No hernia is present.  Lymphadenopathy:     Cervical: No cervical adenopathy.  Skin:    General: Skin is warm and dry.     Findings: No rash.  Neurological:     Mental Status: She is alert.       Lab Results  Component Value Date   CREATININE 0.95 02/12/2018   BUN 13 02/12/2018   NA 138 02/12/2018   K 4.4 02/12/2018   CL 103 02/12/2018   CO2 29 02/12/2018    Lab Results  Component Value Date   WBC 9.1 07/31/2016   HGB 13.7 07/31/2016   HCT 40.8 07/31/2016   MCV 90.3 07/31/2016   PLT 257 07/31/2016   Lab Results  Component Value Date   TSH 0.26 (L) 02/12/2018    Assessment & Plan:  Requests meds refilled to mail order.  Problem List Items Addressed This Visit    Diarrhea - Primary    Started after antibiotic course was completed. ?antibiotic associated diarrhea - rec start probiotic course over 1-2  wks (activia or align) and if ongoing diarrhea, check GI pathogen panel r/o c diff or other infection (sent home stool collection container today). Overall well appearing today. Some concerning night time symptoms.       Relevant Orders   Gastrointestinal Pathogen Panel PCR   Acute recurrent sinusitis    Initial improvement with 10d augmentin course, now with  recurrence of symptoms quickly after antibiotic course completed. Anticipate recurrent sinusitis - however will hold off on further antibiotics given recent diarrhea concerns. See below.           Meds ordered this encounter  Medications  . gabapentin (NEURONTIN) 300 MG capsule    Sig: Take 1 capsule (300 mg total) by mouth at bedtime. For hot flashes    Dispense:  90 capsule    Refill:  3  . losartan (COZAAR) 50 MG tablet    Sig: Take 1 tablet (50 mg total) by mouth daily.    Dispense:  90 tablet    Refill:  3  . traZODone (DESYREL) 100 MG tablet    Sig: Take 1 tablet (100 mg total) by mouth at bedtime.    Dispense:  90 tablet    Refill:  3  . metoprolol tartrate (LOPRESSOR) 25 MG tablet    Sig: Take 1 tablet (25 mg total) by mouth daily.    Dispense:  90 tablet    Refill:  3  . omeprazole (PRILOSEC) 40 MG capsule    Sig: Take 1 capsule (40 mg total) by mouth daily.    Dispense:  90 capsule    Refill:  3  . levothyroxine (SYNTHROID, LEVOTHROID) 112 MCG tablet    Sig: Take 1 tablet (112 mcg total) by mouth daily before breakfast.    Dispense:  90 tablet    Refill:  1   Orders Placed This Encounter  Procedures  . Gastrointestinal Pathogen Panel PCR    Follow up plan: No follow-ups on file.  Ria Bush, MD

## 2018-09-17 NOTE — Patient Instructions (Addendum)
Pass by lab for stool tests checking for infection.  Start probiotic after recent antibiotics - align or [place].  We will hold on further antibiotics for sinuses until diarrhea is better.

## 2018-09-17 NOTE — Assessment & Plan Note (Addendum)
Started after antibiotic course was completed. ?antibiotic associated diarrhea - rec start probiotic course over 1-2 wks (activia or align) and if ongoing diarrhea, check GI pathogen panel r/o c diff or other infection (sent home stool collection container today). Overall well appearing today. Some concerning night time symptoms.

## 2018-09-17 NOTE — Assessment & Plan Note (Signed)
Initial improvement with 10d augmentin course, now with recurrence of symptoms quickly after antibiotic course completed. Anticipate recurrent sinusitis - however will hold off on further antibiotics given recent diarrhea concerns. See below.

## 2018-09-18 ENCOUNTER — Encounter: Payer: Self-pay | Admitting: Family Medicine

## 2018-09-18 DIAGNOSIS — K521 Toxic gastroenteritis and colitis: Secondary | ICD-10-CM | POA: Diagnosis not present

## 2018-09-18 DIAGNOSIS — R197 Diarrhea, unspecified: Secondary | ICD-10-CM | POA: Diagnosis not present

## 2018-09-19 MED ORDER — AMOXICILLIN-POT CLAVULANATE 875-125 MG PO TABS: 1.0000 | ORAL_TABLET | Freq: Two times a day (BID) | ORAL | 0 refills | Status: AC

## 2018-09-19 MED ORDER — GUAIFENESIN-CODEINE 100-10 MG/5ML PO SYRP: 5.0000 mL | ORAL_SOLUTION | Freq: Two times a day (BID) | ORAL | 0 refills | Status: DC | PRN

## 2018-09-21 LAB — GASTROINTESTINAL PATHOGEN PANEL PCR
C. difficile Tox A/B, PCR: NOT DETECTED
Campylobacter, PCR: NOT DETECTED
Cryptosporidium, PCR: NOT DETECTED
E COLI (STEC) STX1/STX2, PCR: NOT DETECTED
E coli (ETEC) LT/ST PCR: NOT DETECTED
E coli 0157, PCR: NOT DETECTED
Giardia lamblia, PCR: NOT DETECTED
NOROVIRUS, PCR: NOT DETECTED
ROTAVIRUS, PCR: NOT DETECTED
SALMONELLA, PCR: NOT DETECTED
SHIGELLA, PCR: NOT DETECTED

## 2018-10-02 ENCOUNTER — Ambulatory Visit: Payer: Medicare Other

## 2018-10-08 ENCOUNTER — Other Ambulatory Visit: Payer: Self-pay | Admitting: Family Medicine

## 2018-10-11 ENCOUNTER — Other Ambulatory Visit: Payer: Self-pay | Admitting: Family Medicine

## 2018-10-28 ENCOUNTER — Ambulatory Visit
Admission: RE | Admit: 2018-10-28 | Discharge: 2018-10-28 | Disposition: A | Discharge status: Home / Self Care | Payer: Medicare Other | Source: Ambulatory Visit | Attending: Family Medicine | Admitting: Family Medicine

## 2018-10-28 DIAGNOSIS — Z1231 Encounter for screening mammogram for malignant neoplasm of breast: Secondary | ICD-10-CM | POA: Diagnosis not present

## 2018-10-29 LAB — HM MAMMOGRAPHY

## 2018-10-30 ENCOUNTER — Encounter: Payer: Self-pay | Admitting: Family Medicine

## 2018-11-17 DIAGNOSIS — M5136 Other intervertebral disc degeneration, lumbar region: Secondary | ICD-10-CM | POA: Diagnosis not present

## 2018-11-25 DIAGNOSIS — H40033 Anatomical narrow angle, bilateral: Secondary | ICD-10-CM | POA: Diagnosis not present

## 2018-11-25 DIAGNOSIS — H04123 Dry eye syndrome of bilateral lacrimal glands: Secondary | ICD-10-CM | POA: Diagnosis not present

## 2018-11-25 DIAGNOSIS — H1013 Acute atopic conjunctivitis, bilateral: Secondary | ICD-10-CM | POA: Diagnosis not present

## 2019-01-14 ENCOUNTER — Other Ambulatory Visit: Payer: Self-pay | Admitting: Family Medicine

## 2019-01-14 DIAGNOSIS — M5416 Radiculopathy, lumbar region: Secondary | ICD-10-CM | POA: Diagnosis not present

## 2019-01-25 DIAGNOSIS — M5136 Other intervertebral disc degeneration, lumbar region: Secondary | ICD-10-CM | POA: Diagnosis not present

## 2019-02-03 DIAGNOSIS — M47896 Other spondylosis, lumbar region: Secondary | ICD-10-CM | POA: Diagnosis not present

## 2019-02-18 DIAGNOSIS — M47816 Spondylosis without myelopathy or radiculopathy, lumbar region: Secondary | ICD-10-CM | POA: Diagnosis not present

## 2019-03-03 ENCOUNTER — Other Ambulatory Visit: Payer: Self-pay | Admitting: Family Medicine

## 2019-03-03 DIAGNOSIS — E785 Hyperlipidemia, unspecified: Secondary | ICD-10-CM

## 2019-03-03 DIAGNOSIS — E538 Deficiency of other specified B group vitamins: Secondary | ICD-10-CM

## 2019-03-03 DIAGNOSIS — E039 Hypothyroidism, unspecified: Secondary | ICD-10-CM

## 2019-03-04 ENCOUNTER — Other Ambulatory Visit (INDEPENDENT_AMBULATORY_CARE_PROVIDER_SITE_OTHER): Payer: Medicare Other

## 2019-03-04 ENCOUNTER — Ambulatory Visit (INDEPENDENT_AMBULATORY_CARE_PROVIDER_SITE_OTHER): Payer: Medicare Other

## 2019-03-04 DIAGNOSIS — Z0000 Encounter for general adult medical examination without abnormal findings: Secondary | ICD-10-CM | POA: Diagnosis not present

## 2019-03-04 DIAGNOSIS — E785 Hyperlipidemia, unspecified: Secondary | ICD-10-CM | POA: Diagnosis not present

## 2019-03-04 DIAGNOSIS — E039 Hypothyroidism, unspecified: Secondary | ICD-10-CM | POA: Diagnosis not present

## 2019-03-04 DIAGNOSIS — E538 Deficiency of other specified B group vitamins: Secondary | ICD-10-CM | POA: Diagnosis not present

## 2019-03-04 LAB — LIPID PANEL
Cholesterol: 197 mg/dL (ref 0–200)
HDL: 38.1 mg/dL — ABNORMAL LOW (ref >39.00)
LDL Cholesterol: 119 mg/dL — ABNORMAL HIGH (ref 0–99)
NonHDL: 158.73
Total CHOL/HDL Ratio: 5
Triglycerides: 197 mg/dL — ABNORMAL HIGH (ref 0.0–149.0)
VLDL: 39.4 mg/dL (ref 0.0–40.0)

## 2019-03-04 LAB — COMPREHENSIVE METABOLIC PANEL
ALT: 10 U/L (ref 0–35)
AST: 16 U/L (ref 0–37)
Albumin: 4 g/dL (ref 3.5–5.2)
Alkaline Phosphatase: 67 U/L (ref 39–117)
BUN: 13 mg/dL (ref 6–23)
CO2: 28 mEq/L (ref 19–32)
Calcium: 8.5 mg/dL (ref 8.4–10.5)
Chloride: 106 mEq/L (ref 96–112)
Creatinine, Ser: 0.97 mg/dL (ref 0.40–1.20)
GFR: 57.82 mL/min — ABNORMAL LOW (ref >60.00)
Glucose, Bld: 102 mg/dL — ABNORMAL HIGH (ref 70–99)
Potassium: 4.6 mEq/L (ref 3.5–5.1)
Sodium: 140 mEq/L (ref 135–145)
Total Bilirubin: 0.5 mg/dL (ref 0.2–1.2)
Total Protein: 6.2 g/dL (ref 6.0–8.3)

## 2019-03-04 LAB — TSH: TSH: 0.31 u[IU]/mL — ABNORMAL LOW (ref 0.35–4.50)

## 2019-03-04 LAB — T4, FREE: Free T4: 1.45 ng/dL (ref 0.60–1.60)

## 2019-03-04 LAB — VITAMIN B12: Vitamin B-12: 953 pg/mL — ABNORMAL HIGH (ref 211–911)

## 2019-03-04 NOTE — Progress Notes (Signed)
Subjective:   Brittany [name] is a 63 y.o. female who presents for Medicare Annual (Subsequent) preventive examination.  Review of Systems:  N/A Cardiac Risk Factors include: dyslipidemia;hypertension;obesity (BMI >30kg/m2)     Objective:     Vitals: There were no vitals taken for this visit.  There is no height or weight on file to calculate BMI.  Advanced Directives 03/04/2019 02/12/2018 02/07/2017 08/28/2016  Does Patient Have a Medical Advance Directive? No No No No  Would patient like information on creating a medical advance directive? No - Patient declined No - Patient declined - Yes (MAU/Ambulatory/Procedural Areas - Information given)    Tobacco Social History   Tobacco Use  Smoking Status Never Smoker  Smokeless Tobacco Never Used     Counseling given: No   Clinical Intake:  Pre-visit preparation completed: Yes  Pain : No/denies pain Pain Score: 0-No pain     Nutritional Status: BMI > 30  Obese Nutritional Risks: None  How often do you need to have someone help you when you read instructions, pamphlets, or other written materials from your doctor or pharmacy?: 1 - Never What is the last grade level you completed in school?: 12th grade  Interpreter Needed?: No  Comments: pt lives with spouse Information entered by :: LPinson, RN  Past Medical History:  Diagnosis Date  . Arthritis    neck pain and numbness left digits Vertell Limber)  . Chronic lower back pain    persistent after L1 cmp fx, with multilevel mild lumbar DDD - good resolution after L L5/S1 ESI ([facility])  . Colon polyps    last colonoscopy 2006? rpt due  . Diverticulitis   . Fatty liver 02/2012   on CT scan 02/2012  . GERD (gastroesophageal reflux disease)   . History of diverticulitis of colon    s/p resection of large intestine, rpt itis 02/2012  . HTN (hypertension)   . Hypothyroid   . Migraines    occasional, stress related  . OSA (obstructive sleep apnea) 09/2016   mild to moderate,  consider CPAP  . Postmenopausal    HRT compound, previously premarin  . Seasonal allergies   . Vertebral compression fracture ([place]) 07/2012   L1 25% Vertell Limber) released without intervention   Past Surgical History:  Procedure Laterality Date  . ANTERIOR FUSION CERVICAL SPINE  01/1999   cervical HNP  . COLON RESECTION  2008   2 ft removed, diverticulitis  . COLONOSCOPY  03/2012   partial colectomy, scattered diverticulae [name], rpt 10 yrs  . ESI Left 06/2013, 12/2013, 09/2014   L5/S1 with good resolution of pain  . ESOPHAGOGASTRODUODENOSCOPY  03/2012   mod gastritis, benign polyps, H pylori neg  . FOOT SURGERY Left 01/2014   big toe fusion  . KNEE SURGERY  1995, 2011   torn menisci (MRI 2010 L)  . SPIROMETRY  2011   normal  . TONSILLECTOMY  1964  . TOTAL ABDOMINAL HYSTERECTOMY W/ BILATERAL SALPINGOOPHORECTOMY  06/2009   vag bleeding  . US ECHOCARDIOGRAPHY  08/2010   normal, EF 63%, mild valvular issues   Family History  Problem Relation Age of Onset  . Coronary artery disease Mother 65       CABG and valve replacement  . Arthritis Mother   . Valvular heart disease Mother        Had valve replacement  . Coronary artery disease Father 36       CAD/MI  . Hypertension Father   . Heart disease Father   .  Diabetes Sister   . Hypertension Sister   . Hypertension Brother   . Coronary artery disease Brother        Stent  . Coronary artery disease Brother        Stents  . Hypertension Brother   . Hypertension Sister   . Mitral valve prolapse Sister   . Hypertension Sister   . Thyroid disease Sister   . Cancer Neg Hx    Social History   Socioeconomic History  . Marital status: Married    Spouse name: Not on file  . Number of children: 2  . Years of education: HS  . Highest education level: Not on file  Occupational History    Employer: NOT EMPLOYED  Social Needs  . Financial resource strain: Not on file  . Food insecurity    Worry: Not on file    Inability: Not  on file  . Transportation needs    Medical: Not on file    Non-medical: Not on file  Tobacco Use  . Smoking status: Never Smoker  . Smokeless tobacco: Never Used  Substance and Sexual Activity  . Alcohol use: Yes    Comment: rare  . Drug use: No  . Sexual activity: Yes    Birth control/protection: Surgical  Lifestyle  . Physical activity    Days per week: Not on file    Minutes per session: Not on file  . Stress: Not on file  Relationships  . Social Herbalist on phone: Not on file    Gets together: Not on file    Attends religious service: Not on file    Active member of club or organization: Not on file    Attends meetings of clubs or organizations: Not on file    Relationship status: Not on file  Other Topics Concern  . Not on file  Social History Narrative   Caffeine: 2-3 cups/day (1-2 coffee); occasional beer   Lives with husband and 37yo son, 4 dogs   Activity: no scheduled - enjoys walking, but just usually run around doing shopping etc. Does not do routine exercise.   Diet: lots of water, fruits/vegetable    Outpatient Encounter Medications as of 03/04/2019  Medication Sig  . aspirin (ASPIRIN EC) 81 MG EC tablet Take 81 mg by mouth daily. Swallow whole.  . Cyanocobalamin (B-12) 1000 MCG SUBL Place 1 tablet under the tongue daily.  . fish oil-omega-3 fatty acids 1000 MG capsule Take 1 g by mouth daily.  Marland Kitchen gabapentin (NEURONTIN) 300 MG capsule Take 1 capsule (300 mg total) by mouth at bedtime. For hot flashes  . ibuprofen (ADVIL,MOTRIN) 200 MG tablet Take 600 mg by mouth as needed.   Marland Kitchen levothyroxine (SYNTHROID) 112 MCG tablet TAKE 1 TABLET BY MOUTH  DAILY BEFORE BREAKFAST  . losartan (COZAAR) 50 MG tablet Take 1 tablet (50 mg total) by mouth daily.  . metoprolol tartrate (LOPRESSOR) 25 MG tablet Take 1 tablet (25 mg total) by mouth daily.  . Multiple Vitamins-Minerals (ALIVE WOMENS ENERGY PO) Take 1 tablet by mouth daily.    Marland Kitchen omeprazole (PRILOSEC) 40 MG  capsule Take 1 capsule (40 mg total) by mouth daily.  . traZODone (DESYREL) 100 MG tablet Take 1 tablet (100 mg total) by mouth at bedtime.  . [DISCONTINUED] guaiFENesin-codeine (CHERATUSSIN AC) 100-10 MG/5ML syrup Take 5 mLs by mouth 2 (two) times daily as needed.   No facility-administered encounter medications on file as of 03/04/2019.  Activities of Daily Living In your present state of health, do you have any difficulty performing the following activities: 03/04/2019  Hearing? N  Vision? N  Difficulty concentrating or making decisions? N  Walking or climbing stairs? Y  Dressing or bathing? N  Doing errands, shopping? N  Preparing Food and eating ? N  Using the Toilet? N  In the past six months, have you accidently leaked urine? N  Do you have problems with loss of bowel control? N  Managing your Medications? N  Managing your Finances? N  Housekeeping or managing your Housekeeping? N  Some recent data might be hidden    Patient Care Team: Ria Bush, MD as PCP - General (Family Medicine)    Assessment:   This is a routine wellness examination for Jaanai.   Hearing Screening   125Hz  250Hz  500Hz  1000Hz  2000Hz  3000Hz  4000Hz  6000Hz  8000Hz   Right ear:           Left ear:           Vision Screening Comments: Vision exam in 2019 @ [facility] and Dietary recommendations Current Exercise Habits: The patient does not participate in regular exercise at present, Exercise limited by: orthopedic condition(s)  Goals    . Weight (lb) < 151 lb (68.5 kg)     Starting 03/04/2019, I will continue to use food dairy to monitor intake in an effort to lose weight.        Fall Risk Fall Risk  03/04/2019 02/12/2018 02/07/2017 09/29/2015 03/08/2014  Falls in the past year? 0 No No No No   Depression Screen PHQ 2/9 Scores 03/04/2019 02/12/2018 02/07/2017 09/29/2015  PHQ - 2 Score 0 1 0 0  PHQ- 9 Score 0 7 - -     Cognitive Function MMSE - Mini Mental State Exam  03/04/2019 02/12/2018 02/07/2017  Orientation to time 5 5 5   Orientation to Place 5 5 5   Registration 3 3 3   Attention/ Calculation 0 0 0  Recall 3 2 3   Recall-comments - unable to recall 1 of 3 words -  Language- name 2 objects 0 0 0  Language- repeat 1 1 1   Language- follow 3 step command 0 3 3  Language- read & follow direction 0 0 0  Write a sentence 0 0 0  Copy design 0 0 0  Total score 17 19 20        PLEASE NOTE: A Mini-Cog screen was completed. Maximum score is 17. A value of 0 denotes this part of Folstein MMSE was not completed or the patient failed this part of the Mini-Cog screening.   Mini-Cog Screening Orientation to Time - Max 5 pts Orientation to Place - Max 5 pts Registration - Max 3 pts Recall - Max 3 pts Language Repeat - Max 1 pts    Immunization History  Administered Date(s) Administered  . Influenza Inj Mdck Quad Pf 06/01/2018  . Influenza Split 09/26/2011  . Influenza,inj,Quad PF,6+ Mos 06/12/2016, 06/01/2018  . Influenza-Unspecified 06/10/2015, 06/27/2017  . Tdap 09/26/2011  . Zoster 06/12/2016    Screening Tests Health Maintenance  Topic Date Due  . PAP SMEAR-Modifier  02/07/2026 (Originally 02/29/1976)  . INFLUENZA VACCINE  04/10/2019  . MAMMOGRAM  10/30/2019  . DTaP/Tdap/Td (2 - Td) 09/25/2021  . TETANUS/TDAP  09/25/2021  . COLONOSCOPY  04/01/2022  . Hepatitis C Screening  Completed  . HIV Screening  Completed       Plan:     I have  personally reviewed, addressed, and noted the following in the patient's chart:  A. Medical and social history B. Use of alcohol, tobacco or illicit drugs  C. Current medications and supplements D. Functional ability and status E.  Nutritional status F.  Physical activity G. Advance directives H. List of other physicians I.  Hospitalizations, surgeries, and ER visits in previous 12 months J.  Vitals (unless it is a telemedicine encounter) K. Screenings to include cognitive, depression, hearing, vision  (NOTE: hearing and vision screenings not completed in telemedicine encounter) L. Referrals and appointments   In addition, I have reviewed and discussed with patient certain preventive protocols, quality metrics, and best practice recommendations. A written personalized care plan for preventive services and recommendations were provided to patient.  With patient's permission, we connected on 03/04/19 at  8:30 AM EDT. Interactive audio and video telecommunications were attempted with patient. This attempt was unsuccessful due to patient having technical difficulties OR patient did not have access to video capability.  Encounter was completed with audio only.  Two patient identifiers were used to ensure the encounter occurred with the correct person. Patient was in home and writer was in office.     Signed,   Lindell Noe, MHA, BS, RN Health Coach

## 2019-03-04 NOTE — Progress Notes (Signed)
PCP notes:   Health maintenance:  No gaps identified.  Abnormal screenings:   None  Patient concerns:   None  Nurse concerns:  None  Next PCP appt:   03/09/19 @ 0830

## 2019-03-04 NOTE — Patient Instructions (Signed)
Brittany [name] , Thank you for taking time to come for your Medicare Wellness Visit. I appreciate your ongoing commitment to your health goals. Please review the following plan we discussed and let me know if I can assist you in the future.   These are the goals we discussed: Goals    . Weight (lb) < 151 lb (68.5 kg)     Starting 03/04/2019, I will continue to use food dairy to monitor intake in an effort to lose weight.        This is a list of the screening recommended for you and due dates:  Health Maintenance  Topic Date Due  . Pap Smear  02/07/2026*  . Flu Shot  04/10/2019  . Mammogram  10/30/2019  . DTaP/Tdap/Td vaccine (2 - Td) 09/25/2021  . Tetanus Vaccine  09/25/2021  . Colon Cancer Screening  04/01/2022  .  Hepatitis C: One time screening is recommended by Center for Disease Control  (CDC) for  adults born from [birth year] through 1966.   Completed  . HIV Screening  Completed  *Topic was postponed. The date shown is not the original due date.   Preventive Care for Adults  A healthy lifestyle and preventive care can promote health and wellness. Preventive health guidelines for adults include the following key practices.  . A routine yearly physical is a good way to check with your health care provider about your health and preventive screening. It is a chance to share any concerns and updates on your health and to receive a thorough exam.  . Visit your dentist for a routine exam and preventive care every 6 months. Brush your teeth twice a day and floss once a day. Good oral hygiene prevents tooth decay and gum disease.  . The frequency of eye exams is based on your age, health, family medical history, use  of contact lenses, and other factors. Follow your health care provider's recommendations for frequency of eye exams.  . Eat a healthy diet. Foods like vegetables, fruits, whole grains, low-fat dairy products, and lean protein foods contain the nutrients you need without too many  calories. Decrease your intake of foods high in solid fats, added sugars, and salt. Eat the right amount of calories for you. Get information about a proper diet from your health care provider, if necessary.  . Regular physical exercise is one of the most important things you can do for your health. Most adults should get at least 150 minutes of moderate-intensity exercise (any activity that increases your heart rate and causes you to sweat) each week. In addition, most adults need muscle-strengthening exercises on 2 or more days a week.  Silver Sneakers may be a benefit available to you. To determine eligibility, you may visit the website: www.silversneakers.com or contact program at [phone] Mon-Fri between 8AM-8PM.   . Maintain a healthy weight. The body mass index (BMI) is a screening tool to identify possible weight problems. It provides an estimate of body fat based on height and weight. Your health care provider can find your BMI and can help you achieve or maintain a healthy weight.   For adults 20 years and older: ? A BMI below 18.5 is considered underweight. ? A BMI of 18.5 to 24.9 is normal. ? A BMI of 25 to 29.9 is considered overweight. ? A BMI of 30 and above is considered obese.   . Maintain normal blood lipids and cholesterol levels by exercising and minimizing your intake of saturated fat. Eat  a balanced diet with plenty of fruit and vegetables. Blood tests for lipids and cholesterol should begin at age 44 and be repeated every 5 years. If your lipid or cholesterol levels are high, you are over 50, or you are at high risk for heart disease, you may need your cholesterol levels checked more frequently. Ongoing high lipid and cholesterol levels should be treated with medicines if diet and exercise are not working.  . If you smoke, find out from your health care provider how to quit. If you do not use tobacco, please do not start.  . If you choose to drink alcohol, please do  not consume more than 2 drinks per day. One drink is considered to be 12 ounces (355 mL) of beer, 5 ounces (148 mL) of wine, or 1.5 ounces (44 mL) of liquor.  . If you are 61-41 years old, ask your health care provider if you should take aspirin to prevent strokes.  . Use sunscreen. Apply sunscreen liberally and repeatedly throughout the day. You should seek shade when your shadow is shorter than you. Protect yourself by wearing long sleeves, pants, a wide-brimmed hat, and sunglasses year round, whenever you are outdoors.  . Once a month, do a whole body skin exam, using a mirror to look at the skin on your back. Tell your health care provider of new moles, moles that have irregular borders, moles that are larger than a pencil eraser, or moles that have changed in shape or color.

## 2019-03-08 NOTE — Progress Notes (Signed)
I reviewed health advisor's note, was available for consultation, and agree with documentation and plan.  

## 2019-03-09 ENCOUNTER — Encounter: Payer: Self-pay | Admitting: Family Medicine

## 2019-03-09 ENCOUNTER — Ambulatory Visit (INDEPENDENT_AMBULATORY_CARE_PROVIDER_SITE_OTHER): Payer: Medicare Other | Admitting: Family Medicine

## 2019-03-09 ENCOUNTER — Other Ambulatory Visit: Payer: Self-pay

## 2019-03-09 VITALS — BP 106/70 | HR 76 | Temp 98.5°F | Ht 63.5 in | Wt 178.0 lb

## 2019-03-09 DIAGNOSIS — I10 Essential (primary) hypertension: Secondary | ICD-10-CM | POA: Diagnosis not present

## 2019-03-09 DIAGNOSIS — M545 Low back pain, unspecified: Secondary | ICD-10-CM

## 2019-03-09 DIAGNOSIS — Z0000 Encounter for general adult medical examination without abnormal findings: Secondary | ICD-10-CM | POA: Diagnosis not present

## 2019-03-09 DIAGNOSIS — Z1283 Encounter for screening for malignant neoplasm of skin: Secondary | ICD-10-CM | POA: Diagnosis not present

## 2019-03-09 DIAGNOSIS — E785 Hyperlipidemia, unspecified: Secondary | ICD-10-CM | POA: Diagnosis not present

## 2019-03-09 DIAGNOSIS — K219 Gastro-esophageal reflux disease without esophagitis: Secondary | ICD-10-CM

## 2019-03-09 DIAGNOSIS — Z7189 Other specified counseling: Secondary | ICD-10-CM

## 2019-03-09 DIAGNOSIS — E039 Hypothyroidism, unspecified: Secondary | ICD-10-CM

## 2019-03-09 DIAGNOSIS — G8929 Other chronic pain: Secondary | ICD-10-CM

## 2019-03-09 DIAGNOSIS — R232 Flushing: Secondary | ICD-10-CM

## 2019-03-09 MED ORDER — LEVOTHYROXINE SODIUM 100 MCG PO TABS: 100.0000 ug | ORAL_TABLET | Freq: Every day | ORAL | 3 refills | Status: DC

## 2019-03-09 NOTE — Assessment & Plan Note (Signed)
Continue gabapentin.

## 2019-03-09 NOTE — Assessment & Plan Note (Signed)
TSH remains low but pt denies hyperthyroid symptoms - will decrease levothyroxine to 120mcg daily and reassess in 3 mo with labs.

## 2019-03-09 NOTE — Progress Notes (Signed)
Brittany [name] - 63 y.o. female  MRN [id number]  Date of Birth: 06-24-1956  PCP: Ria Bush, MD  This service was provided via telemedicine.   Virtual visit completed through [employer]. Due to national recommendations of social distancing due to COVID-19, a virtual visit is felt to be most appropriate for this patient at this time. Reviewed limitations of a virtual visit.   Location of patient: home Location of provider: in office, [place] [facility] Name of referring provider: N/A  Names of persons and role in encounter: Provider: Ria Bush, MD  Patient: Brittany [name]  Other: husband also present on call   Subjective: Chief Complaint  Patient presents with  . Annual Exam    Pt 2.     HPI:  Brittany [name] last week for medicare wellness visit. Note reviewed.    Worsening lower back pain - had MRI told has arthritis. Has received steroid shots which helped for about a week ([facility]).   Preventative: COLONOSCOPY Date: 03/2012 partial colectomy, scattered diverticulae [name], rpt 10 yrs ESOPHAGOGASTRODUODENOSCOPY Date: 03/2012 mod gastritis, benign polyps, H pylori neg Well woman - last when done hysterectomy (benign reason). Doesn't need rpt pap. Ovaries removed (2010).  Mammogram 10/2018 WNL Flu shot - yearly Tdap - 09/2011 zostavax - 06/2016 shingrix - discussed. To check with insurance.  Advanced directives: completed at home but needs to get notarized. Thinks would want sister Brittany [name] to be HCPOA.  Seat [name] use discussed Sunscreen use discussed. No changing moles on skin. Considering seeing dermatologist - requests referral.  Non smoker Alcohol - occasional beer on weekends Dentist - yearly Eye exam yearly  Caffeine: 2-3 cups/day (1-2 coffee)  Lives with husband and 4yo son, 4 dogs  Activity: yardwork, limited by back pain Diet: good water, fruits/vegetable daily   Review of Systems  Constitutional: Negative for chills and fever.  HENT: Negative  for congestion.   Eyes: Negative for blurred vision.  Respiratory: Negative for cough, shortness of breath and wheezing.   Cardiovascular: Negative for chest pain and leg swelling.  Gastrointestinal: Negative for abdominal pain, blood in stool, constipation, diarrhea, nausea and vomiting.  Genitourinary: Negative for hematuria.  Musculoskeletal: Positive for back pain.  Neurological: Negative for dizziness and headaches.  Endo/Heme/Allergies:       Hot flashes at night  Psychiatric/Behavioral: Negative for depression. The patient is not nervous/anxious.      Objective/Observations:  No physical exam or vital signs collected unless specifically identified below.   BP 106/70   Pulse 76   Temp 98.5 F (36.9 C) (Oral)   Ht 5' 3.5" (1.613 m)   Wt 178 lb (80.7 kg)   BMI 31.04 kg/m   Wt Readings from Last 3 Encounters:  03/09/19 178 lb (80.7 kg)  09/17/18 179 lb 12 oz (81.5 kg)  08/11/18 184 lb 8 oz (83.7 kg)    Respiratory status: speaks in complete sentences without evident shortness of breath.   Assessment/Plan: She will call us 2 months after starting new thyroid dose for recheck thyroid labs.  Health maintenance examination Preventative protocols reviewed and updated unless pt declined. Discussed healthy diet and lifestyle.   Advanced care planning/counseling discussion Advanced directives: completed at home but needs to get notarized. Thinks would want sister Brittany [name] to be HCPOA.   Hyperlipidemia with target LDL less than 100 Strong fmhx CAD. LDL goal ideally <100. She takes fish oil daily but not recently with recent steroid injections into back - will restart fish oil. Declines statin. Requests  FLP at f/u labs.  The 10-year ASCVD risk score Mikey Bussing DC Brooke Bonito., et al., 2013) is: 5.5%   Values used to calculate the score:     Age: 29 years     Sex: Female     Is Non-Hispanic African American: No     Diabetic: No     Tobacco smoker: No     Systolic Blood Pressure: 599 mmHg      Is BP treated: Yes     HDL Cholesterol: 38.1 mg/dL     Total Cholesterol: 197 mg/dL   Essential hypertension Chronic, stable. Continue current regimen.   GERD (gastroesophageal reflux disease) Stable on omeprazole daily.   Hypothyroidism TSH remains low but pt denies hyperthyroid symptoms - will decrease levothyroxine to 136mcg daily and reassess in 3 mo with labs.  Hot flashes Continue gabapentin.   Chronic lumbar pain Sees Dr Nelva Bush s/p ESI   I discussed the assessment and treatment plan with the patient. The patient was provided an opportunity to ask questions and all were answered. The patient agreed with the plan and demonstrated an understanding of the instructions.  Lab Orders  No laboratory test(s) ordered today    Meds ordered this encounter  Medications  . levothyroxine (SYNTHROID) 100 MCG tablet    Sig: Take 1 tablet (100 mcg total) by mouth daily before breakfast.    Dispense:  90 tablet    Refill:  3    The patient was advised to call back or seek an in-person evaluation if the symptoms worsen or if the condition fails to improve as anticipated.  Ria Bush, MD

## 2019-03-09 NOTE — Assessment & Plan Note (Signed)
Sees Dr Nelva Bush s/p ESI

## 2019-03-09 NOTE — Assessment & Plan Note (Addendum)
Strong fmhx CAD. LDL goal ideally <100. She takes fish oil daily but not recently with recent steroid injections into back - will restart fish oil. Declines statin. Requests FLP at f/u labs.  The 10-year ASCVD risk score Mikey Bussing DC Brooke Bonito., et al., 2013) is: 5.5%   Values used to calculate the score:     Age: 63 years     Sex: Female     Is Non-Hispanic African American: No     Diabetic: No     Tobacco smoker: No     Systolic Blood Pressure: 921 mmHg     Is BP treated: Yes     HDL Cholesterol: 38.1 mg/dL     Total Cholesterol: 197 mg/dL

## 2019-03-09 NOTE — Assessment & Plan Note (Signed)
Preventative protocols reviewed and updated unless pt declined. Discussed healthy diet and lifestyle.  

## 2019-03-09 NOTE — Assessment & Plan Note (Signed)
Chronic, stable. Continue current regimen. 

## 2019-03-09 NOTE — Assessment & Plan Note (Signed)
Advanced directives: completed at home but needs to get notarized. Thinks would want sister [name] to be HCPOA. 

## 2019-03-09 NOTE — Assessment & Plan Note (Signed)
Stable on omeprazole daily.

## 2019-03-26 ENCOUNTER — Telehealth: Payer: Self-pay | Admitting: Family Medicine

## 2019-03-26 NOTE — Telephone Encounter (Signed)
As pt did not have direct exposure should be ok. If daughter develops symptoms, let us know and we will get Berneita set up for testing.

## 2019-03-26 NOTE — Telephone Encounter (Signed)
Spoke with pt asking what questions she had.  Says her pregnant daughter has learned 2 coworkers have tested positive for COVID-19 recently.  Says she is asx.   Pt is asking since her daughter comes to visit sometimes should she be concerned for her own health.  Pt says she herself is asx.

## 2019-03-26 NOTE — Telephone Encounter (Signed)
Spoke with pt relaying Dr. G's message.  Pt verbalizes understanding and expresses her thanks.  

## 2019-03-26 NOTE — Telephone Encounter (Signed)
Patient left a VM requesting a call back to discuss previous OV with Dr Darnell Level and and she has some Covid related questions.

## 2019-04-03 DIAGNOSIS — M47816 Spondylosis without myelopathy or radiculopathy, lumbar region: Secondary | ICD-10-CM | POA: Diagnosis not present

## 2019-04-16 DIAGNOSIS — M47896 Other spondylosis, lumbar region: Secondary | ICD-10-CM | POA: Diagnosis not present

## 2019-04-22 DIAGNOSIS — D485 Neoplasm of uncertain behavior of skin: Secondary | ICD-10-CM | POA: Diagnosis not present

## 2019-04-22 DIAGNOSIS — Z1283 Encounter for screening for malignant neoplasm of skin: Secondary | ICD-10-CM | POA: Diagnosis not present

## 2019-04-22 DIAGNOSIS — D229 Melanocytic nevi, unspecified: Secondary | ICD-10-CM | POA: Diagnosis not present

## 2019-04-22 DIAGNOSIS — D2230 Melanocytic nevi of unspecified part of face: Secondary | ICD-10-CM | POA: Diagnosis not present

## 2019-04-22 DIAGNOSIS — D225 Melanocytic nevi of trunk: Secondary | ICD-10-CM | POA: Diagnosis not present

## 2019-04-22 DIAGNOSIS — L820 Inflamed seborrheic keratosis: Secondary | ICD-10-CM | POA: Diagnosis not present

## 2019-05-07 DIAGNOSIS — M47816 Spondylosis without myelopathy or radiculopathy, lumbar region: Secondary | ICD-10-CM | POA: Diagnosis not present

## 2019-05-07 DIAGNOSIS — M5136 Other intervertebral disc degeneration, lumbar region: Secondary | ICD-10-CM | POA: Diagnosis not present

## 2019-06-02 ENCOUNTER — Encounter: Payer: Self-pay | Admitting: Family Medicine

## 2019-06-02 DIAGNOSIS — R5383 Other fatigue: Secondary | ICD-10-CM

## 2019-06-02 DIAGNOSIS — E538 Deficiency of other specified B group vitamins: Secondary | ICD-10-CM

## 2019-06-02 DIAGNOSIS — E039 Hypothyroidism, unspecified: Secondary | ICD-10-CM

## 2019-06-09 ENCOUNTER — Other Ambulatory Visit (INDEPENDENT_AMBULATORY_CARE_PROVIDER_SITE_OTHER): Payer: Medicare Other

## 2019-06-09 ENCOUNTER — Other Ambulatory Visit: Payer: Self-pay

## 2019-06-09 DIAGNOSIS — R5383 Other fatigue: Secondary | ICD-10-CM

## 2019-06-09 DIAGNOSIS — E039 Hypothyroidism, unspecified: Secondary | ICD-10-CM | POA: Diagnosis not present

## 2019-06-09 DIAGNOSIS — E538 Deficiency of other specified B group vitamins: Secondary | ICD-10-CM | POA: Diagnosis not present

## 2019-06-09 LAB — VITAMIN D 25 HYDROXY (VIT D DEFICIENCY, FRACTURES): VITD: 33.27 ng/mL (ref 30.00–100.00)

## 2019-06-09 LAB — COMPREHENSIVE METABOLIC PANEL
ALT: 8 U/L (ref 0–35)
AST: 13 U/L (ref 0–37)
Albumin: 4.4 g/dL (ref 3.5–5.2)
Alkaline Phosphatase: 60 U/L (ref 39–117)
BUN: 12 mg/dL (ref 6–23)
CO2: 29 mEq/L (ref 19–32)
Calcium: 9.4 mg/dL (ref 8.4–10.5)
Chloride: 106 mEq/L (ref 96–112)
Creatinine, Ser: 1.06 mg/dL (ref 0.40–1.20)
GFR: 52.14 mL/min — ABNORMAL LOW (ref >60.00)
Glucose, Bld: 102 mg/dL — ABNORMAL HIGH (ref 70–99)
Potassium: 4.5 mEq/L (ref 3.5–5.1)
Sodium: 142 mEq/L (ref 135–145)
Total Bilirubin: 0.5 mg/dL (ref 0.2–1.2)
Total Protein: 6.7 g/dL (ref 6.0–8.3)

## 2019-06-09 LAB — CBC WITH DIFFERENTIAL/PLATELET
Basophils Absolute: 0.1 10*3/uL (ref 0.0–0.1)
Basophils Relative: 1.1 % (ref 0.0–3.0)
Eosinophils Absolute: 0.2 10*3/uL (ref 0.0–0.7)
Eosinophils Relative: 3.1 % (ref 0.0–5.0)
HCT: 40.9 % (ref 36.0–46.0)
Hemoglobin: 13.4 g/dL (ref 12.0–15.0)
Lymphocytes Relative: 43.5 % (ref 12.0–46.0)
Lymphs Abs: 2.5 10*3/uL (ref 0.7–4.0)
MCHC: 32.9 g/dL (ref 30.0–36.0)
MCV: 90.6 fl (ref 78.0–100.0)
Monocytes Absolute: 0.3 10*3/uL (ref 0.1–1.0)
Monocytes Relative: 5.9 % (ref 3.0–12.0)
Neutro Abs: 2.7 10*3/uL (ref 1.4–7.7)
Neutrophils Relative %: 46.4 % (ref 43.0–77.0)
Platelets: 210 10*3/uL (ref 150.0–400.0)
RBC: 4.51 Mil/uL (ref 3.87–5.11)
RDW: 13.3 % (ref 11.5–15.5)
WBC: 5.8 10*3/uL (ref 4.0–10.5)

## 2019-06-09 LAB — T4, FREE: Free T4: 0.94 ng/dL (ref 0.60–1.60)

## 2019-06-09 LAB — TSH: TSH: 1.29 u[IU]/mL (ref 0.35–4.50)

## 2019-06-09 LAB — VITAMIN B12: Vitamin B-12: 791 pg/mL (ref 211–911)

## 2019-06-29 ENCOUNTER — Other Ambulatory Visit: Payer: Self-pay | Admitting: Family Medicine

## 2019-06-30 NOTE — Telephone Encounter (Signed)
E-scribed refill.  Pls schedule wellness and lab visits.

## 2019-07-02 NOTE — Telephone Encounter (Signed)
Pt had annual wellness 03/04/19 with wellness nurse and follow up with Dr. Darnell Level on 03/09/19.

## 2019-08-04 DIAGNOSIS — S61411A Laceration without foreign body of right hand, initial encounter: Secondary | ICD-10-CM | POA: Diagnosis not present

## 2019-08-11 ENCOUNTER — Other Ambulatory Visit: Payer: Self-pay | Admitting: Family Medicine

## 2019-08-11 DIAGNOSIS — Z1231 Encounter for screening mammogram for malignant neoplasm of breast: Secondary | ICD-10-CM

## 2019-09-18 ENCOUNTER — Other Ambulatory Visit: Payer: Self-pay | Admitting: Family Medicine

## 2019-11-01 ENCOUNTER — Encounter: Payer: Self-pay | Admitting: Family Medicine

## 2019-11-01 ENCOUNTER — Other Ambulatory Visit: Payer: Self-pay

## 2019-11-01 ENCOUNTER — Ambulatory Visit
Admission: RE | Admit: 2019-11-01 | Discharge: 2019-11-01 | Disposition: A | Discharge status: Home / Self Care | Payer: Medicare Other | Source: Ambulatory Visit | Attending: Family Medicine | Admitting: Family Medicine

## 2019-11-01 DIAGNOSIS — Z1231 Encounter for screening mammogram for malignant neoplasm of breast: Secondary | ICD-10-CM | POA: Diagnosis not present

## 2019-11-01 LAB — HM MAMMOGRAPHY

## 2019-12-02 ENCOUNTER — Other Ambulatory Visit: Payer: Self-pay

## 2019-12-02 ENCOUNTER — Ambulatory Visit (INDEPENDENT_AMBULATORY_CARE_PROVIDER_SITE_OTHER): Payer: Medicare Other | Admitting: Cardiology

## 2019-12-02 ENCOUNTER — Encounter: Payer: Self-pay | Admitting: Cardiology

## 2019-12-02 VITALS — BP 128/73 | HR 59 | Ht 63.5 in | Wt 181.5 lb

## 2019-12-02 DIAGNOSIS — R002 Palpitations: Secondary | ICD-10-CM | POA: Diagnosis not present

## 2019-12-02 DIAGNOSIS — I10 Essential (primary) hypertension: Secondary | ICD-10-CM | POA: Diagnosis not present

## 2019-12-02 DIAGNOSIS — E785 Hyperlipidemia, unspecified: Secondary | ICD-10-CM

## 2019-12-02 NOTE — Progress Notes (Signed)
Primary Care Provider: Ria Bush, MD Cardiologist: No primary care provider on file. Electrophysiologist: None  Clinic Note: Chief Complaint  Patient presents with  . Follow-up    Delayed.  . Palpitations    Have improved.   HPI:    Brittany [name] is a 64 y.o. female with a PMH below who presents today for basically 2-year follow-up for palpitations and hypertension.  Brittany [name] follows with me every so often.  Her husband Brittany [name] is a patient of mine with history of CAD.  She started seeing me because of palpitation issues.  Noted to have short bursts of PAT with occasional PACs and PVCs noted on monitor but nothing significant. As part of her initial evaluation we did a CPX which recommended weight loss of blood pressure control as well as continued exercise.  Brittany [name] was last seen in May 2018.  She stated that she was doing quite well  Recent Hospitalizations: None  Reviewed  CV studies:     The following studies were reviewed today: (if available, images/films reviewed: From Epic Chart or Care Everywhere) . none:  Interval History:   Brittany [name] returns today overall doing very well.  She says that she still has palpitations off and on in the day, but much less than before.  She notes that when she has a palpitations, the palpitations make her blood pressure go down and she is dizzy but they do not last long enough to make her have syncope or near syncope.  She notes that she is not been as active of late because she did have some arthritis pains but also with COVID-19 restrictions.  She only gets short of breath when she talks for long period time, or has cough spells.  Not really with exertion.  Otherwise doing very well. CV Review of Symptoms (Summary) Cardiovascular ROS: no chest pain or dyspnea on exertion positive for - palpitations, rapid heart rate and Shortness of breath when talking. negative for - edema, orthopnea, paroxysmal nocturnal dyspnea or  Syncope/near syncope or TIA/amaurosis fugax.  Claudication  The patient does not have symptoms concerning for COVID-19 infection (fever, chills, cough, or new shortness of breath).  The patient is practicing social distancing & Masking.   Scheduled for her first Covid vaccine on March 26.  Plan was that she would get hers at the same time as her husband, so he has been waiting for her.  REVIEWED OF SYSTEMS   Review of Systems  Constitutional: Negative for malaise/fatigue.  HENT: Negative for congestion and nosebleeds.   Respiratory: Positive for shortness of breath (During long conversations).   Gastrointestinal: Negative for blood in stool and melena.  Genitourinary: Negative for hematuria.  Musculoskeletal: Positive for joint pain (Hip and knee pains).  Neurological: Negative for dizziness and focal weakness.  Psychiatric/Behavioral: Negative for memory loss. The patient is not nervous/anxious and does not have insomnia.    I have reviewed and (if needed) personally updated the patient's problem list, medications, allergies, past medical and surgical history, social and family history.   PAST MEDICAL HISTORY   Past Medical History:  Diagnosis Date  . Arthritis    neck pain and numbness left digits Vertell Limber)  . Chronic lower back pain    persistent after L1 cmp fx, with multilevel mild lumbar DDD - good resolution after L L5/S1 ESI ([place])  . Colon polyps    last colonoscopy 2006? rpt due  . Diverticulitis   . Fatty liver 02/2012   on CT scan  02/2012  . GERD (gastroesophageal reflux disease)   . History of diverticulitis of colon    s/p resection of large intestine, rpt itis 02/2012  . HTN (hypertension)   . Hypothyroid   . Migraines    occasional, stress related  . OSA (obstructive sleep apnea) 09/2016   mild to moderate, consider CPAP  . Postmenopausal    HRT compound, previously premarin  . Seasonal allergies   . Vertebral compression fracture ([place]) 07/2012   L1 25%  Vertell Limber) released without intervention    PAST SURGICAL HISTORY   Past Surgical History:  Procedure Laterality Date  . ANTERIOR FUSION CERVICAL SPINE  01/1999   cervical HNP  . COLON RESECTION  2008   2 ft removed, diverticulitis  . COLONOSCOPY  03/2012   partial colectomy, scattered diverticulae [name], rpt 10 yrs  . ESI Left 06/2013, 12/2013, 09/2014   L5/S1 with good resolution of pain  . ESOPHAGOGASTRODUODENOSCOPY  03/2012   mod gastritis, benign polyps, H pylori neg  . FOOT SURGERY Left 01/2014   big toe fusion  . KNEE SURGERY  1995, 2011   torn menisci (MRI 2010 L)  . SPIROMETRY  2011   normal  . TONSILLECTOMY  1964  . TOTAL ABDOMINAL HYSTERECTOMY W/ BILATERAL SALPINGOOPHORECTOMY  06/2009   vag bleeding  . US ECHOCARDIOGRAPHY  08/2010   normal, EF 63%, mild valvular issues    MEDICATIONS/ALLERGIES   Current Meds  Medication Sig  . aspirin (ASPIRIN EC) 81 MG EC tablet Take 81 mg by mouth daily. Swallow whole.  . Cyanocobalamin (B-12) 1000 MCG SUBL Place 1 tablet under the tongue daily.  . fish oil-omega-3 fatty acids 1000 MG capsule Take 1 g by mouth daily.  Marland Kitchen gabapentin (NEURONTIN) 600 MG tablet 600 mg at bedtime.  Marland Kitchen ibuprofen (ADVIL,MOTRIN) 200 MG tablet Take 600 mg by mouth as needed.   Marland Kitchen levothyroxine (SYNTHROID) 100 MCG tablet Take 1 tablet (100 mcg total) by mouth daily before breakfast.  . losartan (COZAAR) 50 MG tablet TAKE 1 TABLET BY MOUTH  DAILY  . metoprolol tartrate (LOPRESSOR) 25 MG tablet TAKE 1 TABLET BY MOUTH  DAILY  . Multiple Vitamins-Minerals (ALIVE WOMENS ENERGY PO) Take 1 tablet by mouth daily.    Marland Kitchen omeprazole (PRILOSEC) 40 MG capsule TAKE 1 CAPSULE BY MOUTH  DAILY  . traZODone (DESYREL) 100 MG tablet TAKE 1 TABLET BY MOUTH AT  BEDTIME    Allergies  Allergen Reactions  . Cymbalta [Duloxetine Hcl] Other (See Comments)    suicidality  . Percocet [Oxycodone-Acetaminophen] Other (See Comments)    Nauseated, dizzy    SOCIAL HISTORY/FAMILY  HISTORY   Reviewed in Epic:  Pertinent findings: No changes  OBJCTIVE -PE, EKG, labs   Wt Readings from Last 3 Encounters:  12/02/19 181 lb 8 oz (82.3 kg)  03/09/19 178 lb (80.7 kg)  09/17/18 179 lb 12 oz (81.5 kg)    Physical Exam: BP 128/73   Pulse (!) 59   Ht 5' 3.5" (1.613 m)   Wt 181 lb 8 oz (82.3 kg)   SpO2 95%   BMI 31.65 kg/m  Physical Exam  Constitutional: She is oriented to person, place, and time. She appears well-developed and well-nourished. No distress.  HENT:  Head: Normocephalic and atraumatic.  Neck: No hepatojugular reflux and no JVD present. Carotid bruit is not present.  Cardiovascular: Normal rate, regular rhythm, normal heart sounds and intact distal pulses.  No extrasystoles are present. PMI is not displaced. Exam reveals no gallop  and no friction rub.  No murmur heard. Pulmonary/Chest: Effort normal and breath sounds normal. No respiratory distress. She has no wheezes. She has no rales.  Musculoskeletal:        General: No edema. Normal range of motion.     Cervical back: Normal range of motion and neck supple.  Neurological: She is alert and oriented to person, place, and time.  Psychiatric: She has a normal mood and affect. Her behavior is normal. Judgment and thought content normal.  Vitals reviewed.    Adult ECG Report  Rate: 59 ;  Rhythm: sinus bradycardia and Left axis deviation.  Otherwise normal intervals and durations.;   Narrative Interpretation: Stable EKG.  Recent Labs:    Lab Results  Component Value Date   CHOL 197 03/04/2019   HDL 38.10 (L) 03/04/2019   LDLCALC 119 (H) 03/04/2019   LDLDIRECT 154.0 08/14/2012   TRIG 197.0 (H) 03/04/2019   CHOLHDL 5 03/04/2019   Lab Results  Component Value Date   CREATININE 1.06 06/09/2019   BUN 12 06/09/2019   NA 142 06/09/2019   K 4.5 06/09/2019   CL 106 06/09/2019   CO2 29 06/09/2019   Lab Results  Component Value Date   TSH 1.29 06/09/2019    ASSESSMENT/PLAN   Problem List  Items Addressed This Visit    Essential hypertension - Primary (Chronic)    Stable blood pressures.  He is on metoprolol for palpitations.  Has blood pressure room to take as needed if additional doses. Is also on losartan at low-dose.  Continue current meds.      Hyperlipidemia with target LDL less than 100 (Chronic)    Labs checked by PCP last seen June 2020.  Goal LDL should be less than 100.  Plan was to attempt lifestyle modifications and reassess. Not currently on medical management besides omega-3 fatty acids.  We will defer to PCP.  Low overall 10-year risk with well-controlled blood pressure and notes no family history.  Low threshold however to consider low-dose statin, and would probably get more from 10 mg rosuvastatin as opposed to other low-dose statins..      Palpitations (Chronic)    Overall pretty stable.  Not lasting that long.  Not overly symptomatic.  Okay to take additional doses as needed.      Relevant Orders   EKG 12-Lead       COVID-19 Education: The signs and symptoms of COVID-19 were discussed with the patient and how to seek care for testing (follow up with PCP or arrange E-visit).   The importance of social distancing was discussed today.  I spent a total of 62minutes with the patient. >  50% of the time was spent in direct patient consultation.  Additional time spent with chart review  / charting (studies, outside notes, etc): 6 Total Time: 24 min   Current medicines are reviewed at length with the patient today.  (+/- concerns) n/a  Notice: This dictation was prepared with Dragon dictation along with smaller phrase technology. Any transcriptional errors that result from this process are unintentional and may not be corrected upon review.  Patient Instructions / Medication Changes & Studies & Tests Ordered   Patient Instructions  Medication Instructions:  No changes *If you need a refill on your cardiac medications before your next  appointment, please call your pharmacy*   Lab Work: Not needed   Testing/Procedures: Not needed  Follow-Up: At [facility], you and your health needs are our priority.  As  part of our continuing mission to provide you with exceptional heart care, we have created designated Provider Care Teams.  These Care Teams include your primary Cardiologist (physician) and Advanced Practice Providers (APPs -  Physician Assistants and Nurse Practitioners) who all work together to provide you with the care you need, when you need it.      Your next appointment:   12 month(s)  The format for your next appointment:   In Person  Provider:   Glenetta Hew, MD   Other Instructions     Studies Ordered:   Orders Placed This Encounter  Procedures  . EKG 12-Lead     Glenetta Hew, M.D., M.S. Interventional Cardiologist   Pager # [phone] Phone # [phone] [address] [place], [place] [address]   Thank you for choosing [facility]!!

## 2019-12-02 NOTE — Patient Instructions (Signed)

## 2019-12-03 ENCOUNTER — Ambulatory Visit: Payer: Medicare Other | Attending: Internal Medicine

## 2019-12-03 DIAGNOSIS — Z23 Encounter for immunization: Secondary | ICD-10-CM

## 2019-12-03 NOTE — Progress Notes (Signed)
   Covid-19 Vaccination Clinic  Name:  Brittany [name]    MRN: [id number] DOB: 10/21/1955  12/03/2019  [name] was observed post Covid-19 immunization for 15 minutes without incident. She was provided with Vaccine Information Sheet and instruction to access the V-Safe system.   [name] was instructed to call 911 with any severe reactions post vaccine: Marland Kitchen Difficulty breathing  . Swelling of face and throat  . A fast heartbeat  . A bad rash all over body  . Dizziness and weakness   Immunizations Administered    Name Date Dose VIS Date Route   Pfizer COVID-19 Vaccine 12/03/2019  8:17 AM 0.3 mL 08/20/2019 Intramuscular   Manufacturer: [place]   Lot: [id number]   [place]: [id number]

## 2019-12-05 ENCOUNTER — Encounter: Payer: Self-pay | Admitting: Cardiology

## 2019-12-05 NOTE — Assessment & Plan Note (Signed)
Overall pretty stable.  Not lasting that long.  Not overly symptomatic.  Okay to take additional doses as needed.

## 2019-12-05 NOTE — Assessment & Plan Note (Addendum)
Stable blood pressures.  He is on metoprolol for palpitations.  Has blood pressure room to take as needed if additional doses. Is also on losartan at low-dose.  Continue current meds.

## 2019-12-05 NOTE — Assessment & Plan Note (Addendum)
Labs checked by PCP last seen June 2020.  Goal LDL should be less than 100.  Plan was to attempt lifestyle modifications and reassess. Not currently on medical management besides omega-3 fatty acids.  We will defer to PCP.  Low overall 10-year risk with well-controlled blood pressure and notes no family history.  Low threshold however to consider low-dose statin, and would probably get more from 10 mg rosuvastatin as opposed to other low-dose statins.Marland Kitchen

## 2019-12-28 ENCOUNTER — Ambulatory Visit: Payer: Medicare Other | Attending: Internal Medicine

## 2019-12-28 DIAGNOSIS — Z23 Encounter for immunization: Secondary | ICD-10-CM

## 2019-12-28 NOTE — Progress Notes (Signed)
   Covid-19 Vaccination Clinic  Name:  Brittany [name]    MRN: [id number] DOB: Dec 21, 1955  12/28/2019  [name] was observed post Covid-19 immunization for 15 minutes without incident. She was provided with Vaccine Information Sheet and instruction to access the V-Safe system.   [name] was instructed to call 911 with any severe reactions post vaccine: Marland Kitchen Difficulty breathing  . Swelling of face and throat  . A fast heartbeat  . A bad rash all over body  . Dizziness and weakness   Immunizations Administered    Name Date Dose VIS Date Route   Pfizer COVID-19 Vaccine 12/28/2019  8:09 AM 0.3 mL 11/03/2018 Intramuscular   Manufacturer: [place]   Lot: [id number]   [place]: [id number]

## 2019-12-30 DIAGNOSIS — M25561 Pain in right knee: Secondary | ICD-10-CM | POA: Diagnosis not present

## 2019-12-30 DIAGNOSIS — M1712 Unilateral primary osteoarthritis, left knee: Secondary | ICD-10-CM | POA: Diagnosis not present

## 2019-12-30 DIAGNOSIS — M170 Bilateral primary osteoarthritis of knee: Secondary | ICD-10-CM | POA: Diagnosis not present

## 2019-12-30 DIAGNOSIS — M25562 Pain in left knee: Secondary | ICD-10-CM | POA: Diagnosis not present

## 2020-01-01 ENCOUNTER — Other Ambulatory Visit: Payer: Self-pay | Admitting: Family Medicine

## 2020-01-05 DIAGNOSIS — M25562 Pain in left knee: Secondary | ICD-10-CM | POA: Diagnosis not present

## 2020-01-05 DIAGNOSIS — R269 Unspecified abnormalities of gait and mobility: Secondary | ICD-10-CM | POA: Diagnosis not present

## 2020-01-05 DIAGNOSIS — M25561 Pain in right knee: Secondary | ICD-10-CM | POA: Diagnosis not present

## 2020-01-05 DIAGNOSIS — M170 Bilateral primary osteoarthritis of knee: Secondary | ICD-10-CM | POA: Diagnosis not present

## 2020-01-05 DIAGNOSIS — M1712 Unilateral primary osteoarthritis, left knee: Secondary | ICD-10-CM | POA: Diagnosis not present

## 2020-01-12 DIAGNOSIS — M25561 Pain in right knee: Secondary | ICD-10-CM | POA: Diagnosis not present

## 2020-01-12 DIAGNOSIS — M25562 Pain in left knee: Secondary | ICD-10-CM | POA: Diagnosis not present

## 2020-01-12 DIAGNOSIS — M170 Bilateral primary osteoarthritis of knee: Secondary | ICD-10-CM | POA: Diagnosis not present

## 2020-01-19 DIAGNOSIS — M1711 Unilateral primary osteoarthritis, right knee: Secondary | ICD-10-CM | POA: Diagnosis not present

## 2020-01-19 DIAGNOSIS — M25561 Pain in right knee: Secondary | ICD-10-CM | POA: Diagnosis not present

## 2020-02-08 DIAGNOSIS — M25562 Pain in left knee: Secondary | ICD-10-CM | POA: Diagnosis not present

## 2020-02-27 ENCOUNTER — Other Ambulatory Visit: Payer: Self-pay | Admitting: Family Medicine

## 2020-03-01 DIAGNOSIS — N390 Urinary tract infection, site not specified: Secondary | ICD-10-CM | POA: Diagnosis not present

## 2020-03-22 ENCOUNTER — Other Ambulatory Visit: Payer: Self-pay | Admitting: Family Medicine

## 2020-03-23 NOTE — Telephone Encounter (Signed)
E-scribed refill.  Plz schedule wellness, cpe and lab visits.  

## 2020-03-27 ENCOUNTER — Telehealth: Payer: Self-pay

## 2020-03-27 NOTE — Telephone Encounter (Signed)
Spoke with Brittany [name] anyone with the vaccine should be safe even after known COVID exposure.  To let us know if anyone starts having symptoms - would recommend send for testing at that time. Depending on symptoms also consider strep test.

## 2020-03-27 NOTE — Telephone Encounter (Signed)
Patient contacted the office and states that her son's mother in law was tested for [place] on Friday - went to visit the patient and her family on Saturday - and then her COVID results came back positive on Sunday. Patient states that her son's mother in law has two children who they take care of during they day (4 months and 65 years old) and they both were tested positive for strep today. She states the doctor did not test the children for COVID.  Patient states her whole family has been exposed at this point - and they are not experiencing sx at this time, but they are wondering what her next steps should be as far as getting tested, and what they should do about work or if they need to quarantine? Please advise,

## 2020-04-12 ENCOUNTER — Encounter: Payer: Self-pay | Admitting: Family Medicine

## 2020-04-12 NOTE — Telephone Encounter (Signed)
Do you want to add pt on at 12:30 tomorrow for a virtual visit?

## 2020-04-13 DIAGNOSIS — H1031 Unspecified acute conjunctivitis, right eye: Secondary | ICD-10-CM | POA: Diagnosis not present

## 2020-04-13 DIAGNOSIS — J019 Acute sinusitis, unspecified: Secondary | ICD-10-CM | POA: Diagnosis not present

## 2020-04-13 NOTE — Telephone Encounter (Signed)
Spoke with patient - she ended up going to [facility] - told has sinus infection and pink eye. Started on bactrim for sinuses. Also started on ofloxacin eye drops.

## 2020-05-11 DIAGNOSIS — M170 Bilateral primary osteoarthritis of knee: Secondary | ICD-10-CM | POA: Diagnosis not present

## 2020-05-11 DIAGNOSIS — M25562 Pain in left knee: Secondary | ICD-10-CM | POA: Diagnosis not present

## 2020-05-11 DIAGNOSIS — M25561 Pain in right knee: Secondary | ICD-10-CM | POA: Diagnosis not present

## 2020-05-11 NOTE — Telephone Encounter (Signed)
Called patient and she stated she will give [place] a callback when she is available.

## 2020-05-22 ENCOUNTER — Other Ambulatory Visit: Payer: Self-pay | Admitting: Family Medicine

## 2020-05-24 NOTE — Telephone Encounter (Signed)
E-scribed refills.  Plz schedule wellness, cpe and lab visits.  

## 2020-06-04 DIAGNOSIS — J019 Acute sinusitis, unspecified: Secondary | ICD-10-CM | POA: Diagnosis not present

## 2020-06-13 ENCOUNTER — Ambulatory Visit (INDEPENDENT_AMBULATORY_CARE_PROVIDER_SITE_OTHER): Payer: Medicare Other

## 2020-06-13 ENCOUNTER — Other Ambulatory Visit: Payer: Self-pay

## 2020-06-13 VITALS — Wt 175.0 lb

## 2020-06-13 DIAGNOSIS — Z0000 Encounter for general adult medical examination without abnormal findings: Secondary | ICD-10-CM | POA: Diagnosis not present

## 2020-06-13 NOTE — Progress Notes (Signed)
PCP notes:  Health Maintenance: Prevnar 64- due Flu- due Dexa- due   Abnormal Screenings: none   Patient concerns: none   Nurse concerns: none   Next PCP appt.: 07/19/2020 @ 8:30 am

## 2020-06-13 NOTE — Progress Notes (Addendum)
Subjective:   Audree Schrecengost is a 64 y.o. female who presents for Medicare Annual (Subsequent) preventive examination.  Review of Systems: N/A      I connected with the patient today by telephone and verified that I am speaking with the correct person using two identifiers. Location patient: home Location nurse: work Persons participating in the telephone visit: patient, nurse.   I discussed the limitations, risks, security and privacy concerns of performing an evaluation and management service by telephone and the availability of in person appointments. I also discussed with the patient that there may be a patient responsible charge related to this service. The patient expressed understanding and verbally consented to this telephonic visit.        Cardiac Risk Factors include: advanced age (>53men, >10 women);hypertension;Other (see comment), Risk factor comments: hyperlipidemia     Objective:    Today's Vitals   06/13/20 1158  Weight: 175 lb (79.4 kg)   Body mass index is 30.51 kg/m.  Advanced Directives 06/13/2020 03/04/2019 02/12/2018 02/07/2017 08/28/2016  Does Patient Have a Medical Advance Directive? Yes No No No No  Type of Paramedic of [place];Living will - - - -  Copy of [place] in Chart? No - copy requested - - - -  Would patient like information on creating a medical advance directive? - No - Patient declined No - Patient declined - Yes (MAU/Ambulatory/Procedural Areas - Information given)    Current Medications (verified) Outpatient Encounter Medications as of 06/13/2020  Medication Sig   aspirin (ASPIRIN EC) 81 MG EC tablet Take 81 mg by mouth daily. Swallow whole.   Cyanocobalamin (B-12) 1000 MCG SUBL Place 1 tablet under the tongue daily.   fish oil-omega-3 fatty acids 1000 MG capsule Take 1 g by mouth daily.   gabapentin (NEURONTIN) 600 MG tablet 600 mg at bedtime.   ibuprofen (ADVIL,MOTRIN) 200 MG tablet Take  600 mg by mouth as needed.    levothyroxine (SYNTHROID) 100 MCG tablet TAKE 1 TABLET BY MOUTH  DAILY BEFORE BREAKFAST   losartan (COZAAR) 50 MG tablet TAKE 1 TABLET BY MOUTH  DAILY   metoprolol tartrate (LOPRESSOR) 25 MG tablet TAKE 1 TABLET BY MOUTH  DAILY   Multiple Vitamins-Minerals (ALIVE WOMENS ENERGY PO) Take 1 tablet by mouth daily.     omeprazole (PRILOSEC) 40 MG capsule TAKE 1 CAPSULE BY MOUTH  DAILY   traZODone (DESYREL) 100 MG tablet TAKE 1 TABLET BY MOUTH AT  BEDTIME   No facility-administered encounter medications on file as of 06/13/2020.    Allergies (verified) Cymbalta [duloxetine hcl] and Percocet [oxycodone-acetaminophen]   History: Past Medical History:  Diagnosis Date   Arthritis    neck pain and numbness left digits Vertell Limber)   Chronic lower back pain    persistent after L1 cmp fx, with multilevel mild lumbar DDD - good resolution after L L5/S1 ESI ([place])   Colon polyps    last colonoscopy 2006? rpt due   Diverticulitis    Fatty liver 02/2012   on CT scan 02/2012   GERD (gastroesophageal reflux disease)    History of diverticulitis of colon    s/p resection of large intestine, rpt itis 02/2012   HTN (hypertension)    Hypothyroid    Migraines    occasional, stress related   OSA (obstructive sleep apnea) 09/2016   mild to moderate, consider CPAP   Postmenopausal    HRT compound, previously premarin   Seasonal allergies    Vertebral  compression fracture ([place]) 07/2012   L1 25% Vertell Limber) released without intervention   Past Surgical History:  Procedure Laterality Date   ANTERIOR FUSION CERVICAL SPINE  01/1999   cervical HNP   COLON RESECTION  2008   2 ft removed, diverticulitis   COLONOSCOPY  03/2012   partial colectomy, scattered diverticulae [name], rpt 10 yrs   ESI Left 06/2013, 12/2013, 09/2014   L5/S1 with good resolution of pain   ESOPHAGOGASTRODUODENOSCOPY  03/2012   mod gastritis, benign polyps, H pylori neg   FOOT  SURGERY Left 01/2014   big toe fusion   [place], 2011   torn menisci (MRI 2010 L)   SPIROMETRY  2011   normal   TONSILLECTOMY  1964   TOTAL ABDOMINAL HYSTERECTOMY W/ BILATERAL SALPINGOOPHORECTOMY  06/2009   vag bleeding   US ECHOCARDIOGRAPHY  08/2010   normal, EF 63%, mild valvular issues   Family History  Problem Relation Age of Onset   Coronary artery disease Mother 28       CABG and valve replacement   Arthritis Mother    Valvular heart disease Mother        Had valve replacement   Coronary artery disease Father 105       CAD/MI   Hypertension Father    Heart disease Father    Diabetes Sister    Hypertension Sister    Hypertension Brother    Coronary artery disease Brother        Stent   Coronary artery disease Brother        Stents   Hypertension Brother    Hypertension Sister    Mitral valve prolapse Sister    Hypertension Sister    Thyroid disease Sister    Cancer Neg Hx    Social History   Socioeconomic History   Marital status: Married    Spouse name: Not on file   Number of children: 2   Years of education: HS   Highest education level: Not on file  Occupational History    Employer: NOT EMPLOYED  Tobacco Use   Smoking status: Never Smoker   Smokeless tobacco: Never Used  Scientific laboratory technician Use: Never used  Substance and Sexual Activity   Alcohol use: Yes    Comment: rare   Drug use: No   Sexual activity: Yes    Birth control/protection: Surgical  Other Topics Concern   Not on file  Social History Narrative   Caffeine: 2-3 cups/day (1-2 coffee); occasional beer   Lives with husband and 19yo son, 4 dogs   Activity: no scheduled - enjoys walking, but just usually run around doing shopping etc. Does not do routine exercise.   Diet: lots of water, fruits/vegetable   Social Determinants of Health   Financial Resource Strain: Low Risk    Difficulty of Paying Living Expenses: Not hard at all  Food  Insecurity: No Food Insecurity   Worried About Charity fundraiser in the Last Year: Never true   Ran Out of Food in the Last Year: Never true  Transportation Needs: No Transportation Needs   Lack of Transportation (Medical): No   Lack of Transportation (Non-Medical): No  Physical Activity: Inactive   Days of Exercise per Week: 0 days   Minutes of Exercise per Session: 0 min  Stress: No Stress Concern Present   Feeling of Stress : Not at all  Social Connections:    Frequency of Communication with Friends and Family: Not  on file   Frequency of Social Gatherings with Friends and Family: Not on file   Attends Religious Services: Not on file   Active Member of Clubs or Organizations: Not on file   Attends Archivist Meetings: Not on file   Marital Status: Not on file    Tobacco Counseling Counseling given: Not Answered   Clinical Intake:  Pre-visit preparation completed: Yes  Pain : No/denies pain     Nutritional Risks: None Diabetes: No  How often do you need to have someone help you when you read instructions, pamphlets, or other written materials from your doctor or pharmacy?: 1 - Never What is the last grade level you completed in school?: 12th  Diabetic: No Nutrition Risk Assessment:  Has the patient had any N/V/D within the last 2 months?  No  Does the patient have any non-healing wounds?  No  Has the patient had any unintentional weight loss or weight gain?  No   Diabetes:  Is the patient diabetic?  No  If diabetic, was a CBG obtained today?  N/A Did the patient bring in their glucometer from home?  N/A How often do you monitor your CBG's? N/A.   Financial Strains and Diabetes Management:  Are you having any financial strains with the device, your supplies or your medication? N/A.  Does the patient want to be seen by Chronic Care Management for management of their diabetes?  N/A Would the patient like to be referred to a Nutritionist  or for Diabetic Management?  N/A    Interpreter Needed?: No  Information entered by :: CJohnson, LPN   Activities of Daily Living In your present state of health, do you have any difficulty performing the following activities: 06/13/2020  Hearing? Y  Comment trouble hearing what her son is saying sometimes  Vision? N  Difficulty concentrating or making decisions? N  Walking or climbing stairs? N  Dressing or bathing? N  Doing errands, shopping? N  Preparing Food and eating ? N  Using the Toilet? N  In the past six months, have you accidently leaked urine? Y  Comment does not wear pads anymore due to UTIs  Do you have problems with loss of bowel control? N  Managing your Medications? N  Managing your Finances? N  Housekeeping or managing your Housekeeping? N  Some recent data might be hidden    Patient Care Team: Ria Bush, MD as PCP - General (Family Medicine)  Indicate any recent Medical Services you may have received from other than [facility] providers in the past year (date may be approximate).     Assessment:   This is a routine wellness examination for Leeandra.  Hearing/Vision screen  Hearing Screening   125Hz  250Hz  500Hz  1000Hz  2000Hz  3000Hz  4000Hz  6000Hz  8000Hz   Right ear:           Left ear:           Vision Screening Comments: Patient gets annual eye exams   Dietary issues and exercise activities discussed: Exercise limited by: None identified  Goals     Patient Stated     06/13/2020, I will maintain and continue medications as prescribed.      Weight (lb) < 151 lb (68.5 kg)     Starting 03/04/2019, I will continue to use food dairy to monitor intake in an effort to lose weight.       Depression Screen PHQ 2/9 Scores 06/13/2020 03/04/2019 02/12/2018 02/07/2017 09/29/2015 03/08/2014  PHQ - 2 Score  0 0 1 0 0 0  PHQ- 9 Score 0 0 7 - - -    Fall Risk Fall Risk  06/13/2020 03/04/2019 02/12/2018 02/07/2017 09/29/2015  Falls in the past year? 0 0 No No No  Number  falls in past yr: 0 - - - -  Injury with Fall? 0 - - - -  Risk for fall due to : Medication side effect - - - -  Follow up Falls evaluation completed;Falls prevention discussed - - - -    Any stairs in or around the home? Yes  If so, are there any without handrails? No  Home free of loose throw rugs in walkways, pet beds, electrical cords, etc? Yes  Adequate lighting in your home to reduce risk of falls? Yes   ASSISTIVE DEVICES UTILIZED TO PREVENT FALLS:  Life alert? No  Use of a cane, walker or w/c? No  Grab bars in the bathroom? Yes  Shower chair or bench in shower? No  Elevated toilet seat or a handicapped toilet? Yes   TIMED UP AND GO:  Was the test performed? N/A, telephonic visit.    Cognitive Function: MMSE - Mini Mental State Exam 06/13/2020 03/04/2019 02/12/2018 02/07/2017  Orientation to time 5 5 5 5   Orientation to Place 5 5 5 5   Registration 3 3 3 3   Attention/ Calculation 5 0 0 0  Recall 3 3 2 3   Recall-comments - - unable to recall 1 of 3 words -  Language- name 2 objects - 0 0 0  Language- repeat 1 1 1 1   Language- follow 3 step command - 0 3 3  Language- read & follow direction - 0 0 0  Write a sentence - 0 0 0  Copy design - 0 0 0  Total score - 17 19 20   Mini Cog  Mini-Cog screen was completed. Maximum score is 22. A value of 0 denotes this part of the MMSE was not completed or the patient failed this part of the Mini-Cog screening.       Immunizations Immunization History  Administered Date(s) Administered   Influenza Inj Mdck Quad Pf 06/01/2018   Influenza Split 09/26/2011   Influenza,inj,Quad PF,6+ Mos 06/12/2016, 06/01/2018, 06/03/2019   Influenza-Unspecified 06/10/2015, 06/27/2017   PFIZER SARS-COV-2 Vaccination 12/03/2019, 12/28/2019   Tdap 09/26/2011   Zoster 06/12/2016    TDAP status: Up to date Flu Vaccine status: due, will get at the pharmacy Pneumococcal vaccine status: due, will get at upcoming physical Covid-19 vaccine  status: Completed vaccines  Qualifies for Shingles Vaccine? Yes   Zostavax completed Yes   Shingrix Completed?: No.    Education has been provided regarding the importance of this vaccine. Patient has been advised to call insurance company to determine out of pocket expense if they have not yet received this vaccine. Advised may also receive vaccine at local pharmacy or Health Dept. Verbalized acceptance and understanding.  Screening Tests Health Maintenance  Topic Date Due   PNA vac Low Risk Adult (1 of 2 - PCV13) Never done   INFLUENZA VACCINE  04/09/2020   PAP SMEAR-Modifier  02/07/2026 (Originally 02/29/1977)   MAMMOGRAM  10/31/2020   TETANUS/TDAP  09/25/2021   COLONOSCOPY  04/01/2022   DEXA SCAN  Completed   COVID-19 Vaccine  Completed   Hepatitis C Screening  Completed   HIV Screening  Completed    Health Maintenance  Health Maintenance Due  Topic Date Due   PNA vac Low Risk Adult (1 of 2 -  PCV13) Never done   INFLUENZA VACCINE  04/09/2020    Colorectal cancer screening: Completed 04/01/2012. Repeat every 10 years Mammogram status: Completed 11/01/2019. Repeat every year Bone Density status: due, will discuss with provider   Lung Cancer Screening: (Low Dose CT Chest recommended if Age 82-80 years, 30 pack-year currently smoking OR have quit w/in 15 years.) does not qualify.    Additional Screening:  Hepatitis C Screening: does qualify; Completed 02/07/2017  Vision Screening: Recommended annual ophthalmology exams for early detection of glaucoma and other disorders of the eye. Is the patient up to date with their annual eye exam?  No , Patient will schedule appointment soon  Who is the provider or what is the name of the office in which the patient attends annual eye exams? [place] If pt is not established with a provider, would they like to be referred to a provider to establish care? No .   Dental Screening: Recommended annual dental exams for  proper oral hygiene  Community Resource Referral / Chronic Care Management: CRR required this visit?  No   CCM required this visit?  No      Plan:     I have personally reviewed and noted the following in the patients chart:    Medical and social history  Use of alcohol, tobacco or illicit drugs   Current medications and supplements  Functional ability and status  Nutritional status  Physical activity  Advanced directives  List of other physicians  Hospitalizations, surgeries, and ER visits in previous 12 months  Vitals  Screenings to include cognitive, depression, and falls  Referrals and appointments  In addition, I have reviewed and discussed with patient certain preventive protocols, quality metrics, and best practice recommendations. A written personalized care plan for preventive services as well as general preventive health recommendations were provided to patient.   Due to this being a telephonic visit, the after visit summary with patients personalized plan was offered to patient via officeor my-chart.  Patient preferred to pick up at office at next visit or via mychart.   Andrez Grime, LPN   [id number]

## 2020-06-13 NOTE — Patient Instructions (Signed)
Brittany [name] , Thank you for taking time to come for your Medicare Wellness Visit. I appreciate your ongoing commitment to your health goals. Please review the following plan we discussed and let me know if I can assist you in the future.   Screening recommendations/referrals: Colonoscopy: Up to date, completed 04/01/2012, due 03/2022 Mammogram: Up to date, completed 11/01/2019, due 10/2020 Bone Density: due, will discuss with provider Recommended yearly ophthalmology/optometry visit for glaucoma screening and checkup Recommended yearly dental visit for hygiene and checkup  Vaccinations: Influenza vaccine: due, will get at your pharmacy Pneumococcal vaccine: due, will get at upcoming physical Tdap vaccine: Up to date, completed 09/26/2011, due 09/2021 Shingles vaccine: due, check with your insurance regarding coverage    Covid-19:Completed series  Advanced directives: Please bring a copy of your POA (Power of [place]) and/or Living Will to your next appointment.  Conditions/risks identified: hypertension, hyperlipidemia  Next appointment: Follow up in one year for your annual wellness visit    Preventive Care 57 Years and Older, Female Preventive care refers to lifestyle choices and visits with your health care provider that can promote health and wellness. What does preventive care include?  A yearly physical exam. This is also called an annual well check.  Dental exams once or twice a year.  Routine eye exams. Ask your health care provider how often you should have your eyes checked.  Personal lifestyle choices, including:  Daily care of your teeth and gums.  Regular physical activity.  Eating a healthy diet.  Avoiding tobacco and drug use.  Limiting alcohol use.  Practicing safe sex.  Taking low-dose aspirin every day.  Taking vitamin and mineral supplements as recommended by your health care provider. What happens during an annual well check? The services and  screenings done by your health care provider during your annual well check will depend on your age, overall health, lifestyle risk factors, and family history of disease. Counseling  Your health care provider may ask you questions about your:  Alcohol use.  Tobacco use.  Drug use.  Emotional well-being.  Home and relationship well-being.  Sexual activity.  Eating habits.  History of falls.  Memory and ability to understand (cognition).  Work and work Statistician.  Reproductive health. Screening  You may have the following tests or measurements:  Height, weight, and BMI.  Blood pressure.  Lipid and cholesterol levels. These may be checked every 5 years, or more frequently if you are over 5 years old.  Skin check.  Lung cancer screening. You may have this screening every year starting at age 85 if you have a 30-pack-year history of smoking and currently smoke or have quit within the past 15 years.  Fecal occult blood test (FOBT) of the stool. You may have this test every year starting at age 63.  Flexible sigmoidoscopy or colonoscopy. You may have a sigmoidoscopy every 5 years or a colonoscopy every 10 years starting at age 63.  Hepatitis C blood test.  Hepatitis B blood test.  Sexually transmitted disease (STD) testing.  Diabetes screening. This is done by checking your blood sugar (glucose) after you have not eaten for a while (fasting). You may have this done every 1-3 years.  Bone density scan. This is done to screen for osteoporosis. You may have this done starting at age 47.  Mammogram. This may be done every 1-2 years. Talk to your health care provider about how often you should have regular mammograms. Talk with your health care provider about  your test results, treatment options, and if necessary, the need for more tests. Vaccines  Your health care provider may recommend certain vaccines, such as:  Influenza vaccine. This is recommended every  year.  Tetanus, diphtheria, and acellular pertussis (Tdap, Td) vaccine. You may need a Td booster every 10 years.  Zoster vaccine. You may need this after age 49.  Pneumococcal 13-valent conjugate (PCV13) vaccine. One dose is recommended after age 76.  Pneumococcal polysaccharide (PPSV23) vaccine. One dose is recommended after age 23. Talk to your health care provider about which screenings and vaccines you need and how often you need them. This information is not intended to replace advice given to you by your health care provider. Make sure you discuss any questions you have with your health care provider. Document Released: 09/22/2015 Document Revised: 05/15/2016 Document Reviewed: 06/27/2015 Elsevier Interactive Patient Education  2017 [place] Prevention in the Home Falls can cause injuries. They can happen to people of all ages. There are many things you can do to make your home safe and to help prevent falls. What can I do on the outside of my home?  Regularly fix the edges of walkways and driveways and fix any cracks.  Remove anything that might make you trip as you walk through a door, such as a raised step or threshold.  Trim any bushes or trees on the path to your home.  Use bright outdoor lighting.  Clear any walking paths of anything that might make someone trip, such as rocks or tools.  Regularly check to see if handrails are loose or broken. Make sure that both sides of any steps have handrails.  Any raised decks and porches should have guardrails on the edges.  Have any leaves, snow, or ice cleared regularly.  Use sand or salt on walking paths during winter.  Clean up any spills in your garage right away. This includes oil or grease spills. What can I do in the bathroom?  Use night lights.  Install grab bars by the toilet and in the tub and shower. Do not use towel bars as grab bars.  Use non-skid mats or decals in the tub or shower.  If you  need to sit down in the shower, use a plastic, non-slip stool.  Keep the floor dry. Clean up any water that spills on the floor as soon as it happens.  Remove soap buildup in the tub or shower regularly.  Attach bath mats securely with double-sided non-slip rug tape.  Do not have throw rugs and other things on the floor that can make you trip. What can I do in the bedroom?  Use night lights.  Make sure that you have a light by your bed that is easy to reach.  Do not use any sheets or blankets that are too big for your bed. They should not hang down onto the floor.  Have a firm chair that has side arms. You can use this for support while you get dressed.  Do not have throw rugs and other things on the floor that can make you trip. What can I do in the kitchen?  Clean up any spills right away.  Avoid walking on wet floors.  Keep items that you use a lot in easy-to-reach places.  If you need to reach something above you, use a strong step stool that has a grab bar.  Keep electrical cords out of the way.  Do not use floor polish or wax  that makes floors slippery. If you must use wax, use non-skid floor wax.  Do not have throw rugs and other things on the floor that can make you trip. What can I do with my stairs?  Do not leave any items on the stairs.  Make sure that there are handrails on both sides of the stairs and use them. Fix handrails that are broken or loose. Make sure that handrails are as long as the stairways.  Check any carpeting to make sure that it is firmly attached to the stairs. Fix any carpet that is loose or worn.  Avoid having throw rugs at the top or bottom of the stairs. If you do have throw rugs, attach them to the floor with carpet tape.  Make sure that you have a light switch at the top of the stairs and the bottom of the stairs. If you do not have them, ask someone to add them for you. What else can I do to help prevent falls?  Wear shoes  that:  Do not have high heels.  Have rubber bottoms.  Are comfortable and fit you well.  Are closed at the toe. Do not wear sandals.  If you use a stepladder:  Make sure that it is fully opened. Do not climb a closed stepladder.  Make sure that both sides of the stepladder are locked into place.  Ask someone to hold it for you, if possible.  Clearly mark and make sure that you can see:  Any grab bars or handrails.  First and last steps.  Where the edge of each step is.  Use tools that help you move around (mobility aids) if they are needed. These include:  Canes.  Walkers.  Scooters.  Crutches.  Turn on the lights when you go into a dark area. Replace any light bulbs as soon as they burn out.  Set up your furniture so you have a clear path. Avoid moving your furniture around.  If any of your floors are uneven, fix them.  If there are any pets around you, be aware of where they are.  Review your medicines with your doctor. Some medicines can make you feel dizzy. This can increase your chance of falling. Ask your doctor what other things that you can do to help prevent falls. This information is not intended to replace advice given to you by your health care provider. Make sure you discuss any questions you have with your health care provider. Document Released: 06/22/2009 Document Revised: 02/01/2016 Document Reviewed: 09/30/2014 Elsevier Interactive Patient Education  2017 [employer].

## 2020-06-15 ENCOUNTER — Other Ambulatory Visit: Payer: Self-pay | Admitting: Family Medicine

## 2020-07-13 ENCOUNTER — Other Ambulatory Visit: Payer: Self-pay | Admitting: Family Medicine

## 2020-07-13 DIAGNOSIS — E538 Deficiency of other specified B group vitamins: Secondary | ICD-10-CM

## 2020-07-13 DIAGNOSIS — E039 Hypothyroidism, unspecified: Secondary | ICD-10-CM

## 2020-07-13 DIAGNOSIS — E785 Hyperlipidemia, unspecified: Secondary | ICD-10-CM

## 2020-07-14 ENCOUNTER — Other Ambulatory Visit: Payer: Self-pay

## 2020-07-14 ENCOUNTER — Other Ambulatory Visit (INDEPENDENT_AMBULATORY_CARE_PROVIDER_SITE_OTHER): Payer: Medicare Other

## 2020-07-14 DIAGNOSIS — E039 Hypothyroidism, unspecified: Secondary | ICD-10-CM

## 2020-07-14 DIAGNOSIS — E538 Deficiency of other specified B group vitamins: Secondary | ICD-10-CM

## 2020-07-14 DIAGNOSIS — E785 Hyperlipidemia, unspecified: Secondary | ICD-10-CM

## 2020-07-14 LAB — COMPREHENSIVE METABOLIC PANEL
ALT: 10 U/L (ref 0–35)
AST: 17 U/L (ref 0–37)
Albumin: 4.1 g/dL (ref 3.5–5.2)
Alkaline Phosphatase: 70 U/L (ref 39–117)
BUN: 13 mg/dL (ref 6–23)
CO2: 30 mEq/L (ref 19–32)
Calcium: 8.8 mg/dL (ref 8.4–10.5)
Chloride: 103 mEq/L (ref 96–112)
Creatinine, Ser: 1 mg/dL (ref 0.40–1.20)
GFR: 59.18 mL/min — ABNORMAL LOW (ref >60.00)
Glucose, Bld: 92 mg/dL (ref 70–99)
Potassium: 4.3 mEq/L (ref 3.5–5.1)
Sodium: 140 mEq/L (ref 135–145)
Total Bilirubin: 0.4 mg/dL (ref 0.2–1.2)
Total Protein: 6.5 g/dL (ref 6.0–8.3)

## 2020-07-14 LAB — LIPID PANEL
Cholesterol: 230 mg/dL — ABNORMAL HIGH (ref 0–200)
HDL: 47.2 mg/dL (ref >39.00)
LDL Cholesterol: 152 mg/dL — ABNORMAL HIGH (ref 0–99)
NonHDL: 182.38
Total CHOL/HDL Ratio: 5
Triglycerides: 151 mg/dL — ABNORMAL HIGH (ref 0.0–149.0)
VLDL: 30.2 mg/dL (ref 0.0–40.0)

## 2020-07-14 LAB — TSH: TSH: 3.73 u[IU]/mL (ref 0.35–4.50)

## 2020-07-14 LAB — VITAMIN B12: Vitamin B-12: >1526 pg/mL — ABNORMAL HIGH (ref 211–911)

## 2020-07-19 ENCOUNTER — Telehealth: Payer: Self-pay | Admitting: Family Medicine

## 2020-07-19 ENCOUNTER — Ambulatory Visit (INDEPENDENT_AMBULATORY_CARE_PROVIDER_SITE_OTHER): Payer: Medicare Other | Admitting: Family Medicine

## 2020-07-19 ENCOUNTER — Encounter: Payer: Self-pay | Admitting: Family Medicine

## 2020-07-19 ENCOUNTER — Other Ambulatory Visit: Payer: Self-pay

## 2020-07-19 VITALS — BP 118/62 | HR 85 | Temp 97.6°F | Ht 63.0 in | Wt 179.0 lb

## 2020-07-19 DIAGNOSIS — K6289 Other specified diseases of anus and rectum: Secondary | ICD-10-CM | POA: Insufficient documentation

## 2020-07-19 DIAGNOSIS — Z7189 Other specified counseling: Secondary | ICD-10-CM | POA: Diagnosis not present

## 2020-07-19 DIAGNOSIS — I10 Essential (primary) hypertension: Secondary | ICD-10-CM

## 2020-07-19 DIAGNOSIS — E785 Hyperlipidemia, unspecified: Secondary | ICD-10-CM

## 2020-07-19 DIAGNOSIS — K219 Gastro-esophageal reflux disease without esophagitis: Secondary | ICD-10-CM

## 2020-07-19 DIAGNOSIS — E039 Hypothyroidism, unspecified: Secondary | ICD-10-CM

## 2020-07-19 DIAGNOSIS — Z0000 Encounter for general adult medical examination without abnormal findings: Secondary | ICD-10-CM

## 2020-07-19 MED ORDER — POTASSIUM 99 MG PO TABS: 1.0000 | ORAL_TABLET | Freq: Every day | ORAL | 0 refills | Status: DC

## 2020-07-19 MED ORDER — MAGNESIUM 200 MG PO TABS: 1.0000 | ORAL_TABLET | Freq: Every day | ORAL | Status: AC

## 2020-07-19 MED ORDER — VITAMIN D3 25 MCG (1000 UT) PO CAPS: 1.0000 | ORAL_CAPSULE | Freq: Every day | ORAL | Status: AC

## 2020-07-19 MED ORDER — LEVOTHYROXINE SODIUM 100 MCG PO TABS
100.0000 ug | ORAL_TABLET | Freq: Every day | ORAL | 3 refills | Status: DC
Start: 2020-07-19 — End: 2021-08-13

## 2020-07-19 MED ORDER — OMEPRAZOLE 40 MG PO CPDR
40.0000 mg | DELAYED_RELEASE_CAPSULE | Freq: Every day | ORAL | 3 refills | Status: DC
Start: 2020-07-19 — End: 2021-07-19

## 2020-07-19 MED ORDER — LOSARTAN POTASSIUM 50 MG PO TABS
50.0000 mg | ORAL_TABLET | Freq: Every day | ORAL | 3 refills | Status: DC
Start: 2020-07-19 — End: 2021-08-01

## 2020-07-19 MED ORDER — ZINC GLUCONATE 50 MG PO TABS: 50.0000 mg | ORAL_TABLET | Freq: Every day | ORAL | Status: AC

## 2020-07-19 MED ORDER — TRAZODONE HCL 100 MG PO TABS
100.0000 mg | ORAL_TABLET | Freq: Every day | ORAL | 3 refills | Status: DC
Start: 2020-07-19 — End: 2021-04-20

## 2020-07-19 NOTE — Assessment & Plan Note (Signed)
Advanced directives: completed at home but needs to get notarized. Thinks would want sister [name] to be HCPOA.

## 2020-07-19 NOTE — Patient Instructions (Addendum)
If interested, check with pharmacy about new 2 shot shingles series (shingrix).  You are doing well today Return as needed or in 1 year for next physical.  Increase water, increase fiber in diet.   Health Maintenance After Age 65 After age 44, you are at a higher risk for certain long-term diseases and infections as well as injuries from falls. Falls are a major cause of broken bones and head injuries in people who are older than age 1. Getting regular preventive care can help to keep you healthy and well. Preventive care includes getting regular testing and making lifestyle changes as recommended by your health care provider. Talk with your health care provider about:  Which screenings and tests you should have. A screening is a test that checks for a disease when you have no symptoms.  A diet and exercise plan that is right for you. What should I know about screenings and tests to prevent falls? Screening and testing are the best ways to find a health problem early. Early diagnosis and treatment give you the best chance of managing medical conditions that are common after age 65. Certain conditions and lifestyle choices may make you more likely to have a fall. Your health care provider may recommend:  Regular vision checks. Poor vision and conditions such as cataracts can make you more likely to have a fall. If you wear glasses, make sure to get your prescription updated if your vision changes.  Medicine review. Work with your health care provider to regularly review all of the medicines you are taking, including over-the-counter medicines. Ask your health care provider about any side effects that may make you more likely to have a fall. Tell your health care provider if any medicines that you take make you feel dizzy or sleepy.  Osteoporosis screening. Osteoporosis is a condition that causes the bones to get weaker. This can make the bones weak and cause them to break more easily.  Blood  pressure screening. Blood pressure changes and medicines to control blood pressure can make you feel dizzy.  Strength and balance checks. Your health care provider may recommend certain tests to check your strength and balance while standing, walking, or changing positions.  Foot health exam. Foot pain and numbness, as well as not wearing proper footwear, can make you more likely to have a fall.  Depression screening. You may be more likely to have a fall if you have a fear of falling, feel emotionally low, or feel unable to do activities that you used to do.  Alcohol use screening. Using too much alcohol can affect your balance and may make you more likely to have a fall. What actions can I take to lower my risk of falls? General instructions  Talk with your health care provider about your risks for falling. Tell your health care provider if: ? You fall. Be sure to tell your health care provider about all falls, even ones that seem minor. ? You feel dizzy, sleepy, or off-balance.  Take over-the-counter and prescription medicines only as told by your health care provider. These include any supplements.  Eat a healthy diet and maintain a healthy weight. A healthy diet includes low-fat dairy products, low-fat (lean) meats, and fiber from whole grains, beans, and lots of fruits and vegetables. Home safety  Remove any tripping hazards, such as rugs, cords, and clutter.  Install safety equipment such as grab bars in bathrooms and safety rails on stairs.  Keep rooms and walkways well-lit. Activity  Follow a regular exercise program to stay fit. This will help you maintain your balance. Ask your health care provider what types of exercise are appropriate for you.  If you need a cane or walker, use it as recommended by your health care provider.  Wear supportive shoes that have nonskid soles. Lifestyle  Do not drink alcohol if your health care provider tells you not to drink.  If you  drink alcohol, limit how much you have: ? 0-1 drink a day for women. ? 0-2 drinks a day for men.  Be aware of how much alcohol is in your drink. In the [place] one drink equals one typical bottle of beer (12 oz), one-half glass of wine (5 oz), or one shot of hard liquor (1 oz).  Do not use any products that contain nicotine or tobacco, such as cigarettes and e-cigarettes. If you need help quitting, ask your health care provider. Summary  Having a healthy lifestyle and getting preventive care can help to protect your health and wellness after age 75.  Screening and testing are the best way to find a health problem early and help you avoid having a fall. Early diagnosis and treatment give you the best chance for managing medical conditions that are more common for people who are older than age 64.  Falls are a major cause of broken bones and head injuries in people who are older than age 32. Take precautions to prevent a fall at home.  Work with your health care provider to learn what changes you can make to improve your health and wellness and to prevent falls. This information is not intended to replace advice given to you by your health care provider. Make sure you discuss any questions you have with your health care provider. Document Revised: 12/17/2018 Document Reviewed: 07/09/2017 Elsevier Patient Education  2020 [employer].

## 2020-07-19 NOTE — Assessment & Plan Note (Signed)
Preventative protocols reviewed and updated unless pt declined. Discussed healthy diet and lifestyle.  

## 2020-07-19 NOTE — Addendum Note (Signed)
Addended by: Ria Bush on: 07/19/2020 10:30 AM   Modules accepted: Orders

## 2020-07-19 NOTE — Telephone Encounter (Signed)
Forgot to offer at [place] increased cholesterol levels noted this year, would be reasonable to start statin to lower cardiovascular risk. Would she be interested in trial crestor 5-10mg  daily?

## 2020-07-19 NOTE — Assessment & Plan Note (Signed)
Chronic, stable on thyroid replacement.

## 2020-07-19 NOTE — Assessment & Plan Note (Signed)
Continues omeprazole 40mg daily.  

## 2020-07-19 NOTE — Assessment & Plan Note (Signed)
Chronic, stable. Continue current regimen.  Now off metoprolol.

## 2020-07-19 NOTE — Assessment & Plan Note (Signed)
Mass more consistent with ext hemorrhoid however not location (superior to anal area). Will refer to gen surg for evaluation.

## 2020-07-19 NOTE — Progress Notes (Addendum)
This visit was conducted in person.  BP 118/62 (BP Location: Left Arm, Patient Position: Sitting, Cuff Size: Normal)   Pulse 85   Temp 97.6 F (36.4 C) (Temporal)   Ht 5\' 3"  (1.6 m)   Wt 179 lb (81.2 kg)   SpO2 96%   BMI 31.71 kg/m    CC: CPE Subjective:    Patient ID: Brittany [name], female    DOB: [birth year], 64 y.o.   MRN: [id number]  HPI: Brittany [name] is a 64 y.o. female presenting on 07/19/2020 for Annual Exam (Prt 2. )   Saw health advisor last month for medicare wellness visit. Note reviewed.    No exam data present    Clinical Support from 06/13/2020 in [place] at [facility] Total Score 0      Fall Risk  06/13/2020 03/04/2019 02/12/2018 02/07/2017 09/29/2015  Falls in the past year? 0 0 No No No  Number falls in past yr: 0 - - - -  Injury with Fall? 0 - - - -  Risk for fall due to : Medication side effect - - - -  Follow up Falls evaluation completed;Falls prevention discussed - - - -   Stopped aspirin daily.  Stopped B12 due to elevated vitamin readings.  She started vitamin C daily.  She added zinc 50mg  daily as well as magnesium, potassium and vitamin D.  Off metoprolol - has discussed this with cardiology.  She could do better with water intake.  Pain with some swelling at tailbone - attributed to hemorrhoid treating with prep H - pain has improved but bump persists. This is not at anus but actually superior.   Preventative: COLONOSCOPY Date: 03/2012 partial colectomy (h/o diverticulitis), scattered diverticulae [name], rpt 10 yrs  ESOPHAGOGASTRODUODENOSCOPY Date: 03/2012 mod gastritis, benign polyps, H pylori neg Well woman - s/p hysterectomy (benign reason). Doesn't need rpt pap. Ovaries removed(2010). Mammogram 10/2019 [facility] at [facility]  Lung cancer screening - not eligible Flu shot - yearly [place] 11/2019, 12/2019, 06/2020 Tdap - 09/2011 zostavax -06/2016 shingrix - discussed. To check with insurance.   Advanced directives: completed at home but needs to get notarized. Thinks would want sister [name] to be HCPOA. Seat belt use discussed Sunscreen use discussed. No changing moles on skin. Saw dermatologist last year Non smoker Alcohol - occasional beer on weekends  Dentist - yearly Eye exam yearly Bowel - no constipation Bladder - rare urge incontinence   Caffeine: 2-3 cups/day (1-2 coffee)  Lives with husband and 67yo son, 4 dogs  Activity:yardwork, limited by back pain Diet: good water, fruits/vegetable daily     Relevant past medical, surgical, family and social history reviewed and updated as indicated. Interim medical history since our last visit reviewed. Allergies and medications reviewed and updated. Outpatient Medications Prior to Visit  Medication Sig Dispense Refill  . Ascorbic Acid (VITAMIN C) 1000 MG tablet Take 1,000 mg by mouth daily.    Marland Kitchen gabapentin (NEURONTIN) 600 MG tablet 600 mg at bedtime.    Marland Kitchen ibuprofen (ADVIL,MOTRIN) 200 MG tablet Take 600 mg by mouth as needed.     . Multiple Vitamins-Minerals (ALIVE WOMENS ENERGY PO) Take 1 tablet by mouth daily.      . Omega-3 Fatty Acids (FISH OIL PO) Take by mouth daily. Takes 2,400 mg    . levothyroxine (SYNTHROID) 100 MCG tablet TAKE 1 TABLET BY MOUTH  DAILY BEFORE BREAKFAST 90 tablet 0  . losartan (COZAAR) 50 MG tablet TAKE  1 TABLET BY MOUTH  DAILY 90 tablet 0  . omeprazole (PRILOSEC) 40 MG capsule TAKE 1 CAPSULE BY MOUTH  DAILY 90 capsule 0  . traZODone (DESYREL) 100 MG tablet TAKE 1 TABLET BY MOUTH AT  BEDTIME 90 tablet 0  . aspirin (ASPIRIN EC) 81 MG EC tablet Take 81 mg by mouth daily. Swallow whole.    . Cyanocobalamin (B-12) 1000 MCG SUBL Place 1 tablet under the tongue daily. 30 each   . fish oil-omega-3 fatty acids 1000 MG capsule Take 1 g by mouth daily.    . metoprolol tartrate (LOPRESSOR) 25 MG tablet TAKE 1 TABLET BY MOUTH  DAILY 90 tablet 0   No facility-administered medications prior to visit.      Per HPI unless specifically indicated in ROS section below Review of Systems  Constitutional: Negative for activity change, appetite change, chills, fatigue, fever and unexpected weight change.  HENT: Negative for hearing loss.   Eyes: Negative for visual disturbance.  Respiratory: Positive for shortness of breath (exertional - chronic - s/p normal treadmill test). Negative for cough, chest tightness and wheezing.   Cardiovascular: Negative for chest pain, palpitations and leg swelling.  Gastrointestinal: Negative for abdominal distention, abdominal pain, blood in stool, constipation, diarrhea, nausea and vomiting.  Genitourinary: Negative for difficulty urinating and hematuria.  Musculoskeletal: Negative for arthralgias, myalgias and neck pain.  Skin: Negative for rash.  Neurological: Negative for dizziness, seizures, syncope and headaches.  Hematological: Negative for adenopathy. Does not bruise/bleed easily.  Psychiatric/Behavioral: Negative for dysphoric mood. The patient is not nervous/anxious.    Objective:  BP 118/62 (BP Location: Left Arm, Patient Position: Sitting, Cuff Size: Normal)   Pulse 85   Temp 97.6 F (36.4 C) (Temporal)   Ht 5\' 3"  (1.6 m)   Wt 179 lb (81.2 kg)   SpO2 96%   BMI 31.71 kg/m   Wt Readings from Last 3 Encounters:  07/19/20 179 lb (81.2 kg)  06/13/20 175 lb (79.4 kg)  12/02/19 181 lb 8 oz (82.3 kg)      Physical Exam Vitals and nursing note reviewed.  Constitutional:      General: She is not in acute distress.    Appearance: Normal appearance. She is well-developed. She is not ill-appearing.  HENT:     Head: Normocephalic and atraumatic.     Right Ear: Hearing, tympanic membrane, ear canal and external ear normal.     Left Ear: Hearing, tympanic membrane, ear canal and external ear normal.  Eyes:     General: No scleral icterus.    Extraocular Movements: Extraocular movements intact.     Conjunctiva/sclera: Conjunctivae normal.     Pupils:  Pupils are equal, round, and reactive to light.  Neck:     Thyroid: No thyroid mass or thyromegaly.     Vascular: No carotid bruit.  Cardiovascular:     Rate and Rhythm: Normal rate and regular rhythm.     Pulses: Normal pulses.          Radial pulses are 2+ on the right side and 2+ on the left side.     Heart sounds: Normal heart sounds. No murmur heard.   Pulmonary:     Effort: Pulmonary effort is normal. No respiratory distress.     Breath sounds: Normal breath sounds. No wheezing, rhonchi or rales.  Abdominal:     General: Abdomen is flat. Bowel sounds are normal. There is no distension.     Palpations: Abdomen is soft. There is no  mass.     Tenderness: There is no abdominal tenderness. There is no guarding or rebound.     Hernia: No hernia is present.  Genitourinary:    Comments: Non-tender lump ~1.5cm diameter superior to anus on right, no erythema  Musculoskeletal:        General: Normal range of motion.     Cervical back: Normal range of motion and neck supple.     Right lower leg: No edema.     Left lower leg: No edema.  Lymphadenopathy:     Cervical: No cervical adenopathy.  Skin:    General: Skin is warm and dry.     Findings: No rash.  Neurological:     General: No focal deficit present.     Mental Status: She is alert and oriented to person, place, and time.     Comments: CN grossly intact, station and gait intact  Psychiatric:        Mood and Affect: Mood normal.        Behavior: Behavior normal.        Thought Content: Thought content normal.        Judgment: Judgment normal.       Results for orders placed or performed in visit on 07/14/20  Vitamin B12  Result Value Ref Range   Vitamin B-12 >1526 (H) 211 - 911 pg/mL  TSH  Result Value Ref Range   TSH 3.73 0.35 - 4.50 uIU/mL  Comprehensive metabolic panel  Result Value Ref Range   Sodium 140 135 - 145 mEq/L   Potassium 4.3 3.5 - 5.1 mEq/L   Chloride 103 96 - 112 mEq/L   CO2 30 19 - 32 mEq/L    Glucose, Bld 92 70 - 99 mg/dL   BUN 13 6 - 23 mg/dL   Creatinine, Ser 1.00 0.40 - 1.20 mg/dL   Total Bilirubin 0.4 0.2 - 1.2 mg/dL   Alkaline Phosphatase 70 39 - 117 U/L   AST 17 0 - 37 U/L   ALT 10 0 - 35 U/L   Total Protein 6.5 6.0 - 8.3 g/dL   Albumin 4.1 3.5 - 5.2 g/dL   GFR 59.18 (L) >60.00 mL/min   Calcium 8.8 8.4 - 10.5 mg/dL  Lipid panel  Result Value Ref Range   Cholesterol 230 (H) 0 - 200 mg/dL   Triglycerides 151.0 (H) 0 - 149 mg/dL   HDL 47.20 >39.00 mg/dL   VLDL 30.2 0.0 - 40.0 mg/dL   LDL Cholesterol 152 (H) 0 - 99 mg/dL   Total CHOL/HDL Ratio 5    NonHDL 182.38    Assessment & Plan:  This visit occurred during the SARS-CoV-2 public health emergency.  Safety protocols were in place, including screening questions prior to the visit, additional usage of staff PPE, and extensive cleaning of exam room while observing appropriate contact time as indicated for disinfecting solutions.   Problem List Items Addressed This Visit    Mass of perianal area    Mass more consistent with ext hemorrhoid however not location (superior to anal area). Will refer to gen surg for evaluation.       Relevant Orders   Ambulatory referral to General Surgery   Hypothyroidism    Chronic, stable on thyroid replacement.       Relevant Medications   levothyroxine (SYNTHROID) 100 MCG tablet   Hyperlipidemia with target LDL less than 100 (Chronic)    Chronic, off medication. LDL elevated. ASCVD 7% - renew efforts at dietary modifications  to control LDL. Offered statin trial. If persistently elevated, low threshold to start statin (likely rosuva 10mg  daily). The 10-year ASCVD risk score Mikey Bussing DC Brooke Bonito., et al., 2013) is: 7.3%   Values used to calculate the score:     Age: 86 years     Sex: Female     Is Non-Hispanic African American: No     Diabetic: No     Tobacco smoker: No     Systolic Blood Pressure: 176 mmHg     Is BP treated: Yes     HDL Cholesterol: 47.2 mg/dL     Total Cholesterol:  230 mg/dL       Relevant Medications   losartan (COZAAR) 50 MG tablet   Health maintenance examination - Primary    Preventative protocols reviewed and updated unless pt declined. Discussed healthy diet and lifestyle.       GERD (gastroesophageal reflux disease)    Continues omeprazole 40mg  daily.       Relevant Medications   omeprazole (PRILOSEC) 40 MG capsule   Essential hypertension (Chronic)    Chronic, stable. Continue current regimen.  Now off metoprolol.       Relevant Medications   losartan (COZAAR) 50 MG tablet   Advanced care planning/counseling discussion    Advanced directives: completed at home but needs to get notarized. Thinks would want sister [name] to be HCPOA.          Meds ordered this encounter  Medications  . zinc gluconate 50 MG tablet    Sig: Take 1 tablet (50 mg total) by mouth daily.  . Magnesium 200 MG TABS    Sig: Take 1 tablet (200 mg total) by mouth daily.  . Potassium 99 MG TABS    Sig: Take 1 tablet (99 mg total) by mouth daily.    Refill:  0  . Cholecalciferol (VITAMIN D3) 25 MCG (1000 UT) CAPS    Sig: Take 1 capsule (1,000 Units total) by mouth daily.    Dispense:  30 capsule  . levothyroxine (SYNTHROID) 100 MCG tablet    Sig: Take 1 tablet (100 mcg total) by mouth daily before breakfast.    Dispense:  90 tablet    Refill:  3  . losartan (COZAAR) 50 MG tablet    Sig: Take 1 tablet (50 mg total) by mouth daily.    Dispense:  90 tablet    Refill:  3  . omeprazole (PRILOSEC) 40 MG capsule    Sig: Take 1 capsule (40 mg total) by mouth daily.    Dispense:  90 capsule    Refill:  3  . traZODone (DESYREL) 100 MG tablet    Sig: Take 1 tablet (100 mg total) by mouth at bedtime.    Dispense:  90 tablet    Refill:  3   Orders Placed This Encounter  Procedures  . Ambulatory referral to General Surgery    Referral Priority:   Routine    Referral Type:   Surgical    Referral Reason:   Specialty Services Required    Requested  Specialty:   General Surgery    Number of Visits Requested:   1    Patient instructions: If interested, check with pharmacy about new 2 shot shingles series (shingrix).  You are doing well today Return as needed or in 1 year for next physical.  Increase water, increase fiber in diet.   Follow up plan: Return in about 1 year (around 07/19/2021) for annual exam, prior fasting for  blood work, [employer] visit.  Ria Bush, MD

## 2020-07-19 NOTE — Assessment & Plan Note (Addendum)
Chronic, off medication. LDL elevated. ASCVD 7% - renew efforts at dietary modifications to control LDL. Offered statin trial. If persistently elevated, low threshold to start statin (likely rosuva 10mg  daily). The 10-year ASCVD risk score Mikey Bussing DC Brooke Bonito., et al., 2013) is: 7.3%   Values used to calculate the score:     Age: 64 years     Sex: Female     Is Non-Hispanic African American: No     Diabetic: No     Tobacco smoker: No     Systolic Blood Pressure: 698 mmHg     Is BP treated: Yes     HDL Cholesterol: 47.2 mg/dL     Total Cholesterol: 230 mg/dL

## 2020-07-20 MED ORDER — ATORVASTATIN CALCIUM 20 MG PO TABS: 20.0000 mg | ORAL_TABLET | Freq: Every day | ORAL | 1 refills | Status: DC

## 2020-07-20 NOTE — Telephone Encounter (Signed)
Spoke with pt relaying Dr. Synthia Innocent message.  Pt verbalizes understanding but declines to start Crestor due to spouse and family members having bad body aches with the med.  States she is willing to try something else.

## 2020-07-20 NOTE — Addendum Note (Signed)
Addended by: Brenton Grills on: [id number] 03:01 PM   Modules accepted: Orders

## 2020-07-20 NOTE — Telephone Encounter (Addendum)
How about atorvastatin (lipitor) 20mg ? If she agrees, please send in:  Atorvastatin 20mg  1 tablet daily PO #90 RF1.

## 2020-07-20 NOTE — Telephone Encounter (Addendum)
Spoke with pt asking if she is ok to try Lipitor.  She agrees.  FYI to Dr. Darnell Level.   E-scribed per Dr. Darnell Level.

## 2020-08-07 ENCOUNTER — Encounter: Payer: Self-pay | Admitting: Family Medicine

## 2020-08-07 ENCOUNTER — Telehealth (INDEPENDENT_AMBULATORY_CARE_PROVIDER_SITE_OTHER): Payer: Medicare Other | Admitting: Primary Care

## 2020-08-07 ENCOUNTER — Other Ambulatory Visit: Payer: Self-pay

## 2020-08-07 ENCOUNTER — Encounter: Payer: Self-pay | Admitting: Primary Care

## 2020-08-07 VITALS — BP 134/80 | HR 76 | Temp 100.5°F | Ht 63.0 in | Wt 179.0 lb

## 2020-08-07 DIAGNOSIS — J019 Acute sinusitis, unspecified: Secondary | ICD-10-CM

## 2020-08-07 DIAGNOSIS — K649 Unspecified hemorrhoids: Secondary | ICD-10-CM | POA: Diagnosis not present

## 2020-08-07 MED ORDER — AMOXICILLIN-POT CLAVULANATE 875-125 MG PO TABS: 1.0000 | ORAL_TABLET | Freq: Two times a day (BID) | ORAL | 0 refills | Status: DC

## 2020-08-07 NOTE — Progress Notes (Signed)
Subjective:    Patient ID: Brittany [name], female    DOB: May 09, 1956, 64 y.o.   MRN: [id number]  HPI  Virtual Visit via Video Note  I connected with Brittany [name] on 08/07/20 at 10:40 AM EST by a video enabled telemedicine application and verified that I am speaking with the correct person using two identifiers.  Location: Patient: Home Provider: Office Participants: Patient and myself    I discussed the limitations of evaluation and management by telemedicine and the availability of in person appointments. The patient expressed understanding and agreed to proceed.  History of Present Illness:  Ms. Dreese is a 64 year old female patient of Dr. Danise Mina with a history of migraines, hypertension, sinusitis, OSA, hypothyroidism who presents today with a chief complaint of nasal congestion.  Symptoms began about five to six days ago with sinus pressure and nasal congestion, feels like her typical sinusitis symptoms. Since then she's been running low grade fevers symptoms are progressing with sinus pressure to the frontal lobes, cough. She's expelling thick, green mucous from her nasal cavity.   She is fully vaccinated against Covid-19, including her Booster. She will be traveling to see her mother in four days who resides in a nursing home in [place]. She has not seen her mother in 2 years due to the Covid-19 pandemic.    Observations/Objective:  Alert and oriented. Appears well, not sickly. No distress. Speaking in complete sentences.  Assessment and Plan:  Acute sinusitis symptoms in patient with well known sinusitis occurring annually. Progressing symptoms. She does appear bothered by symptoms, will also be seeing her elderly mother later this week.  Agree to provide Augmentin antibiotics given symptoms and history.  Rx sent to pharmacy. Recommend Covid-19 testing, especially prior to seeing her mother.   Return precautions provided.   Follow Up  Instructions:  Start Augmentin antibiotics for the infection Take 1 tablet by mouth twice daily for 7 days.  Please update [place] if no improvement in 3-4 days.  It was a pleasure meeting you! Allie Bossier, NP-C    I discussed the assessment and treatment plan with the patient. The patient was provided an opportunity to ask questions and all were answered. The patient agreed with the plan and demonstrated an understanding of the instructions.   The patient was advised to call back or seek an in-person evaluation if the symptoms worsen or if the condition fails to improve as anticipated.    Pleas Koch, NP    Review of Systems  Constitutional: Positive for fatigue and fever.  HENT: Positive for congestion, sinus pressure and sinus pain.   Respiratory: Positive for cough.   Cardiovascular: Negative for chest pain.  Allergic/Immunologic: Positive for environmental allergies.       Past Medical History:  Diagnosis Date  . Arthritis    neck pain and numbness left digits Vertell Limber)  . Chronic lower back pain    persistent after L1 cmp fx, with multilevel mild lumbar DDD - good resolution after L L5/S1 ESI ([place])  . Colon polyps    last colonoscopy 2006? rpt due  . Diverticulitis   . Fatty liver 02/2012   on CT scan 02/2012  . GERD (gastroesophageal reflux disease)   . History of diverticulitis of colon    s/p resection of large intestine, rpt itis 02/2012  . HTN (hypertension)   . Hypothyroid   . Migraines    occasional, stress related  . OSA (obstructive sleep apnea) 09/2016   mild  to moderate, consider CPAP  . Postmenopausal    HRT compound, previously premarin  . Seasonal allergies   . Vertebral compression fracture ([place]) 07/2012   L1 25% Vertell Limber) released without intervention     Social History   Socioeconomic History  . Marital status: Married    Spouse name: Not on file  . Number of children: 2  . Years of education: HS  . Highest education level: Not on file   Occupational History    Employer: NOT EMPLOYED  Tobacco Use  . Smoking status: Never Smoker  . Smokeless tobacco: Never Used  Vaping Use  . Vaping Use: Never used  Substance and Sexual Activity  . Alcohol use: Yes    Comment: rare  . Drug use: No  . Sexual activity: Yes    Birth control/protection: Surgical  Other Topics Concern  . Not on file  Social History Narrative   Caffeine: 2-3 cups/day (1-2 coffee); occasional beer   Lives with husband and 51yo son, 4 dogs   Activity: no scheduled - enjoys walking, but just usually run around doing shopping etc. Does not do routine exercise.   Diet: lots of water, fruits/vegetable   Social Determinants of Health   Financial Resource Strain: Low Risk   . Difficulty of Paying Living Expenses: Not hard at all  Food Insecurity: No Food Insecurity  . Worried About Charity fundraiser in the Last Year: Never true  . Ran Out of Food in the Last Year: Never true  Transportation Needs: No Transportation Needs  . Lack of Transportation (Medical): No  . Lack of Transportation (Non-Medical): No  Physical Activity: Inactive  . Days of Exercise per Week: 0 days  . Minutes of Exercise per Session: 0 min  Stress: No Stress Concern Present  . Feeling of Stress : Not at all  Social Connections:   . Frequency of Communication with Friends and Family: Not on file  . Frequency of Social Gatherings with Friends and Family: Not on file  . Attends Religious Services: Not on file  . Active Member of Clubs or Organizations: Not on file  . Attends Archivist Meetings: Not on file  . Marital Status: Not on file  Intimate Partner Violence: Not At Risk  . Fear of Current or Ex-Partner: No  . Emotionally Abused: No  . Physically Abused: No  . Sexually Abused: No    Past Surgical History:  Procedure Laterality Date  . ANTERIOR FUSION CERVICAL SPINE  01/1999   cervical HNP  . COLON RESECTION  2008   2 ft removed, diverticulitis  .  COLONOSCOPY  03/2012   partial colectomy, scattered diverticulae [name], rpt 10 yrs  . ESI Left 06/2013, 12/2013, 09/2014   L5/S1 with good resolution of pain  . ESOPHAGOGASTRODUODENOSCOPY  03/2012   mod gastritis, benign polyps, H pylori neg  . FOOT SURGERY Left 01/2014   big toe fusion  . KNEE SURGERY  1995, 2011   torn menisci (MRI 2010 L)  . SPIROMETRY  2011   normal  . TONSILLECTOMY  1964  . TOTAL ABDOMINAL HYSTERECTOMY W/ BILATERAL SALPINGOOPHORECTOMY  06/2009   vag bleeding  . US ECHOCARDIOGRAPHY  08/2010   normal, EF 63%, mild valvular issues    Family History  Problem Relation Age of Onset  . Coronary artery disease Mother 32       CABG and valve replacement  . Arthritis Mother   . Valvular heart disease Mother  Had valve replacement  . Coronary artery disease Father 77       CAD/MI  . Hypertension Father   . Heart disease Father   . Diabetes Sister   . Hypertension Sister   . Hypertension Brother   . Coronary artery disease Brother        Stent  . Coronary artery disease Brother        Stents  . Hypertension Brother   . Hypertension Sister   . Mitral valve prolapse Sister   . Hypertension Sister   . Thyroid disease Sister   . Cancer Neg Hx     Allergies  Allergen Reactions  . Cymbalta [Duloxetine Hcl] Other (See Comments)    suicidality  . Percocet [Oxycodone-Acetaminophen] Other (See Comments)    Nauseated, dizzy    Current Outpatient Medications on File Prior to Visit  Medication Sig Dispense Refill  . Ascorbic Acid (VITAMIN C) 1000 MG tablet Take 1,000 mg by mouth daily.    Marland Kitchen atorvastatin (LIPITOR) 20 MG tablet Take 1 tablet (20 mg total) by mouth daily. 90 tablet 1  . Cholecalciferol (VITAMIN D3) 25 MCG (1000 UT) CAPS Take 1 capsule (1,000 Units total) by mouth daily. 30 capsule   . gabapentin (NEURONTIN) 600 MG tablet 600 mg at bedtime.    Marland Kitchen ibuprofen (ADVIL,MOTRIN) 200 MG tablet Take 600 mg by mouth as needed.     Marland Kitchen levothyroxine  (SYNTHROID) 100 MCG tablet Take 1 tablet (100 mcg total) by mouth daily before breakfast. 90 tablet 3  . losartan (COZAAR) 50 MG tablet Take 1 tablet (50 mg total) by mouth daily. 90 tablet 3  . Magnesium 200 MG TABS Take 1 tablet (200 mg total) by mouth daily.    . Multiple Vitamins-Minerals (ALIVE WOMENS ENERGY PO) Take 1 tablet by mouth daily.      . Omega-3 Fatty Acids (FISH OIL PO) Take by mouth daily. Takes 2,400 mg    . omeprazole (PRILOSEC) 40 MG capsule Take 1 capsule (40 mg total) by mouth daily. 90 capsule 3  . Potassium 99 MG TABS Take 1 tablet (99 mg total) by mouth daily.  0  . traZODone (DESYREL) 100 MG tablet Take 1 tablet (100 mg total) by mouth at bedtime. 90 tablet 3  . zinc gluconate 50 MG tablet Take 1 tablet (50 mg total) by mouth daily.     No current facility-administered medications on file prior to visit.    BP 134/80   Pulse 76   Temp (!) 100.5 F (38.1 C) (Temporal)   Ht 5\' 3"  (1.6 m)   Wt 179 lb (81.2 kg)   BMI 31.71 kg/m    Objective:   Physical Exam Constitutional:      Appearance: She is not ill-appearing.  Pulmonary:     Effort: Pulmonary effort is normal.     Comments: Dry cough occurring once during visit Neurological:     Mental Status: She is alert and oriented to person, place, and time.  Psychiatric:        Mood and Affect: Mood normal.            Assessment & Plan:

## 2020-08-07 NOTE — Assessment & Plan Note (Signed)
Acute sinusitis symptoms in patient with well known sinusitis occurring annually. Progressing symptoms. She does appear bothered by symptoms, will also be seeing her elderly mother later this week.  Agree to provide Augmentin antibiotics given symptoms and history.  Rx sent to pharmacy. Recommend Covid-19 testing, especially prior to seeing her mother.   Return precautions provided.

## 2020-08-07 NOTE — Patient Instructions (Signed)
Start Augmentin antibiotics for the infection Take 1 tablet by mouth twice daily for 7 days.  Please update [place] if no improvement in 3-4 days.  It was a pleasure meeting you! Allie Bossier, NP-C

## 2020-08-07 NOTE — Telephone Encounter (Signed)
Opened in error; Disregard.

## 2020-08-07 NOTE — Telephone Encounter (Signed)
Spoke with pt getting details of sxs.  Pt c/o green nasal discharge, sinus pressure and facial pain in forehead.  Sxs started 08/04/20.  Tried DayQuil, NightQuil and nasal spray at night.  Pt scheduled MyChart video visit with Anda Kraft at 10:40.  She is able to get vitals.  FYI to [facility].

## 2020-08-15 ENCOUNTER — Other Ambulatory Visit: Payer: Self-pay | Admitting: Family Medicine

## 2020-08-31 ENCOUNTER — Other Ambulatory Visit: Payer: Self-pay | Admitting: Family Medicine

## 2020-09-13 ENCOUNTER — Encounter: Payer: Self-pay | Admitting: Gastroenterology

## 2020-09-21 DIAGNOSIS — M1712 Unilateral primary osteoarthritis, left knee: Secondary | ICD-10-CM | POA: Diagnosis not present

## 2020-09-21 DIAGNOSIS — M170 Bilateral primary osteoarthritis of knee: Secondary | ICD-10-CM | POA: Diagnosis not present

## 2020-09-21 DIAGNOSIS — M25561 Pain in right knee: Secondary | ICD-10-CM | POA: Diagnosis not present

## 2020-09-21 DIAGNOSIS — M25562 Pain in left knee: Secondary | ICD-10-CM | POA: Diagnosis not present

## 2020-09-28 DIAGNOSIS — M25561 Pain in right knee: Secondary | ICD-10-CM | POA: Diagnosis not present

## 2020-09-28 DIAGNOSIS — M1711 Unilateral primary osteoarthritis, right knee: Secondary | ICD-10-CM | POA: Diagnosis not present

## 2020-10-03 DIAGNOSIS — Z20822 Contact with and (suspected) exposure to covid-19: Secondary | ICD-10-CM | POA: Diagnosis not present

## 2020-10-04 DIAGNOSIS — M1712 Unilateral primary osteoarthritis, left knee: Secondary | ICD-10-CM | POA: Diagnosis not present

## 2020-10-04 DIAGNOSIS — M25562 Pain in left knee: Secondary | ICD-10-CM | POA: Diagnosis not present

## 2020-10-06 DIAGNOSIS — M25561 Pain in right knee: Secondary | ICD-10-CM | POA: Diagnosis not present

## 2020-10-06 DIAGNOSIS — M1711 Unilateral primary osteoarthritis, right knee: Secondary | ICD-10-CM | POA: Diagnosis not present

## 2020-10-11 DIAGNOSIS — M1712 Unilateral primary osteoarthritis, left knee: Secondary | ICD-10-CM | POA: Diagnosis not present

## 2020-10-11 DIAGNOSIS — M25562 Pain in left knee: Secondary | ICD-10-CM | POA: Diagnosis not present

## 2020-10-12 DIAGNOSIS — M1711 Unilateral primary osteoarthritis, right knee: Secondary | ICD-10-CM | POA: Diagnosis not present

## 2020-10-12 DIAGNOSIS — M25561 Pain in right knee: Secondary | ICD-10-CM | POA: Diagnosis not present

## 2020-10-26 ENCOUNTER — Ambulatory Visit: Payer: Medicare Other | Admitting: Gastroenterology

## 2020-10-26 ENCOUNTER — Encounter: Payer: Self-pay | Admitting: Gastroenterology

## 2020-10-26 VITALS — BP 144/72 | HR 72 | Ht 62.75 in | Wt 182.0 lb

## 2020-10-26 DIAGNOSIS — R194 Change in bowel habit: Secondary | ICD-10-CM | POA: Diagnosis not present

## 2020-10-26 DIAGNOSIS — Z9049 Acquired absence of other specified parts of digestive tract: Secondary | ICD-10-CM | POA: Diagnosis not present

## 2020-10-26 DIAGNOSIS — Z8719 Personal history of other diseases of the digestive system: Secondary | ICD-10-CM | POA: Diagnosis not present

## 2020-10-26 DIAGNOSIS — R195 Other fecal abnormalities: Secondary | ICD-10-CM

## 2020-10-26 DIAGNOSIS — K5790 Diverticulosis of intestine, part unspecified, without perforation or abscess without bleeding: Secondary | ICD-10-CM

## 2020-10-26 DIAGNOSIS — K219 Gastro-esophageal reflux disease without esophagitis: Secondary | ICD-10-CM

## 2020-10-26 MED ORDER — SUPREP BOWEL PREP KIT 17.5-3.13-1.6 GM/177ML PO SOLN: 1.0000 | ORAL | 0 refills | Status: DC

## 2020-10-26 NOTE — Patient Instructions (Addendum)
We have sent the following medications to your pharmacy for you to pick up at your convenience: [facility] have been scheduled for an endoscopy and colonoscopy. Please follow the written instructions given to you at your visit today. Please pick up your prep supplies at the pharmacy within the next 1-3 days. If you use inhalers (even only as needed), please bring them with you on the day of your procedure.   Try Metamucil daily through the month of February, if ineffective then try Fiber Con 1-2 tablets daily.   Toileting tips to help with your constipation - Drink at least 64-80 ounces of water/liquid per day. - Establish a time to try to move your bowels every day.  For many people, this is after a cup of coffee or after a meal such as breakfast. - Sit all of the way back on the toilet keeping your back fairly straight and while sitting up, try to rest the tops of your forearms on your upper thighs.   - Raising your feet with a step stool/squatty potty can be helpful to improve the angle that allows your stool to pass through the rectum. - Relax the rectum feeling it bulge toward the toilet water.  If you feel your rectum raising toward your body, you are contracting rather than relaxing. - Breathe in and slowly exhale. "Belly breath" by expanding your belly towards your belly button. Keep belly expanded as you gently direct pressure down and back to the anus.  A low pitched GRRR sound can assist with increasing intra-abdominal pressure.  - Repeat 3-4 times. If unsuccessful, contract the pelvic floor to restore normal tone and get off the toilet.  Avoid excessive straining. - To reduce excessive wiping by teaching your anus to normally contract, place hands on outer aspect of knees and resist knee movement outward.  Hold 5-10 second then place hands just inside of knees and resist inward movement of knees.  Hold 5 seconds.  Repeat a few times each way.   If you are age 22 or older, your body  mass index should be between 23-30. Your Body mass index is 32.5 kg/m. If this is out of the aforementioned range listed, please consider follow up with your Primary Care Provider.  Thank you for choosing me and [facility].  Dr. Rush Landmark

## 2020-10-27 ENCOUNTER — Telehealth: Payer: Self-pay | Admitting: Gastroenterology

## 2020-10-27 NOTE — Telephone Encounter (Signed)
Pt states her SUPREP is too expensive and is requesting for a cheaper alternative.

## 2020-10-30 ENCOUNTER — Encounter: Payer: Self-pay | Admitting: Gastroenterology

## 2020-10-30 DIAGNOSIS — R195 Other fecal abnormalities: Secondary | ICD-10-CM | POA: Insufficient documentation

## 2020-10-30 DIAGNOSIS — R194 Change in bowel habit: Secondary | ICD-10-CM | POA: Insufficient documentation

## 2020-10-30 DIAGNOSIS — Z9049 Acquired absence of other specified parts of digestive tract: Secondary | ICD-10-CM | POA: Insufficient documentation

## 2020-10-30 DIAGNOSIS — K5790 Diverticulosis of intestine, part unspecified, without perforation or abscess without bleeding: Secondary | ICD-10-CM | POA: Insufficient documentation

## 2020-10-30 DIAGNOSIS — Z8719 Personal history of other diseases of the digestive system: Secondary | ICD-10-CM | POA: Insufficient documentation

## 2020-10-30 NOTE — Telephone Encounter (Signed)
Called and spoke with pt- informed her that her insurance will not cover prep for colonoscopy. I tried multiple preps, even alternate preps listed by insurance and they were not covered either. Pt has been informed and states to leave it as is.

## 2020-10-30 NOTE — Progress Notes (Signed)
[facility] VISIT   Primary Care Provider Ria Bush, MD [place] [address] [phone]  Referring Provider Dr. Dema Severin  Patient Profile: Brittany [name] is a 65 y.o. female with a pmh significant for arthritis, hypertension, hyperlipidemia, hypothyroidism, migraines, sleep apnea, allergies, diverticulosis and prior diverticulitis (status post colonic resection for diverticulitis), GERD.  The patient presents to the [facility] for an evaluation and management of problem(s) noted below:  Problem List 1. Change in bowel habits   2. Change in stool caliber   3. Gastroesophageal reflux disease, unspecified whether esophagitis present   4. Diverticulosis   5. History of diverticulitis   6. History of partial colectomy     History of Present Illness This is the patient's first visit to the outpatient [facility] clinic.  I met the patient previously through her husband who is one of my patients as well.  Patient describes 4 years experiencing issues with heartburn.  Infrequently she has solid food dysphagia but that has been few and far between the course of the last few years.  As long as she takes her acid reducing medication she feels things are under good control.  She does experience issues of bloating and flatulence.  She has had episodes of diarrhea and softer stools.  She has noted a caliber change in her stools over the course of the last few months as well.  There was concern in the past for a potential hemorrhoid as she was experiencing some significant discomfort within the last few months.  She was referred to [facility] surgery and was seen by Dr. Dema Severin.  She has noticed infrequent episodes of bright red blood per rectum which she is attributed to her known hemorrhoids in the past.  She has a history that is consistent for previous diverticulitis requiring segmental colectomy but did very well with  this.  Her last colonoscopy was noted in 2013 with a widely patent anastomosis and diverticulosis still being seen.  When the patient was seen by colorectal surgery and anoscopy was performed that showed tiny internal hemorrhoids but no other significant issues.  By the time she was evaluated by colorectal surgery her symptoms had abated.  She is no longer having significant discomfort or pain in the rectum or perirectal region.  She does find it hard at times to stay clean and so will occasionally have pads to protect her from having any fecal soilage.  She denies any overt incontinence but she does at times have urgency to use the restroom.  GI Review of Systems Positive as above Negative for odynophagia, nausea, vomiting, pain, melena  Review of Systems General: Denies fevers/chills/weight loss unintentionally HEENT: Denies oral lesions/sore throat/headaches/visual changes Cardiovascular: Denies chest pain/palpitations Pulmonary: Denies shortness of breath/nocturnal cough Gastroenterological: See HPI Genitourinary: Denies darkened urine Hematological: Denies easy bruising/bleeding Endocrine: Denies temperature intolerance Dermatological: Denies jaundice Psychological: Mood is stable   Medications Current Outpatient Medications  Medication Sig Dispense Refill  . Ascorbic Acid (VITAMIN C) 1000 MG tablet Take 1,000 mg by mouth daily.    Marland Kitchen atorvastatin (LIPITOR) 20 MG tablet Take 1 tablet (20 mg total) by mouth daily. 90 tablet 1  . Cholecalciferol (VITAMIN D3) 25 MCG (1000 UT) CAPS Take 1 capsule (1,000 Units total) by mouth daily. 30 capsule   . gabapentin (NEURONTIN) 600 MG tablet 600 mg at bedtime.    Marland Kitchen ibuprofen (ADVIL,MOTRIN) 200 MG tablet Take 600 mg by mouth as needed.    Marland Kitchen  levothyroxine (SYNTHROID) 100 MCG tablet Take 1 tablet (100 mcg total) by mouth daily before breakfast. 90 tablet 3  . losartan (COZAAR) 50 MG tablet Take 1 tablet (50 mg total) by mouth daily. 90 tablet 3  .  Magnesium 200 MG TABS Take 1 tablet (200 mg total) by mouth daily.    . Multiple Vitamins-Minerals (ALIVE WOMENS ENERGY PO) Take 1 tablet by mouth daily.    . Na Sulfate-K Sulfate-Mg Sulf (SUPREP BOWEL PREP KIT) 17.5-3.13-1.6 GM/177ML SOLN Take 1 kit by mouth as directed. For colonoscopy prep 354 mL 0  . Omega-3 Fatty Acids (FISH OIL PO) Take by mouth daily. Takes 2,400 mg    . omeprazole (PRILOSEC) 40 MG capsule Take 1 capsule (40 mg total) by mouth daily. 90 capsule 3  . Potassium 99 MG TABS Take 1 tablet (99 mg total) by mouth daily.  0  . traZODone (DESYREL) 100 MG tablet Take 1 tablet (100 mg total) by mouth at bedtime. 90 tablet 3  . zinc gluconate 50 MG tablet Take 1 tablet (50 mg total) by mouth daily.     No current facility-administered medications for this visit.    Allergies Allergies  Allergen Reactions  . Cymbalta [Duloxetine Hcl] Other (See Comments)    suicidality  . Percocet [Oxycodone-Acetaminophen] Nausea And Vomiting and Other (See Comments)    Nauseated, dizzy    Histories Past Medical History:  Diagnosis Date  . Arthritis    neck pain and numbness left digits Vertell Limber)  . Chronic lower back pain    persistent after L1 cmp fx, with multilevel mild lumbar DDD - good resolution after L L5/S1 ESI ([facility])  . Colon polyps    last colonoscopy 2006? rpt due  . Diverticulitis   . Fatty liver 02/2012   on CT scan 02/2012  . GERD (gastroesophageal reflux disease)   . History of diverticulitis of colon    s/p resection of large intestine, rpt itis 02/2012  . HLD (hyperlipidemia)   . HTN (hypertension)   . Hypothyroidism   . Migraines    occasional, stress related  . OSA (obstructive sleep apnea) 09/2016   mild to moderate, consider CPAP  . Postmenopausal    HRT compound, previously premarin  . Seasonal allergies   . Vertebral compression fracture ([place]) 07/2012   L1 25% Vertell Limber) released without intervention   Past Surgical History:  Procedure Laterality Date  .  ANTERIOR FUSION CERVICAL SPINE  01/1999   cervical HNP  . COLON RESECTION  2008   2 ft removed, diverticulitis  . COLONOSCOPY  03/2012   partial colectomy, scattered diverticulae [name], rpt 10 yrs  . ESI Left 06/2013, 12/2013, 09/2014   L5/S1 with good resolution of pain  . ESOPHAGOGASTRODUODENOSCOPY  03/2012   mod gastritis, benign polyps, H pylori neg  . FOOT SURGERY Left 01/2014   big toe fusion  . KNEE SURGERY  1995, 2011   torn menisci (MRI 2010 L)  . SPIROMETRY  2011   normal  . TONSILLECTOMY  1964  . TOTAL ABDOMINAL HYSTERECTOMY W/ BILATERAL SALPINGOOPHORECTOMY  06/2009   vag bleeding  . US ECHOCARDIOGRAPHY  08/2010   normal, EF 63%, mild valvular issues   Social History   Socioeconomic History  . Marital status: Married    Spouse name: Not on file  . Number of children: 2  . Years of education: HS  . Highest education level: Not on file  Occupational History  . Occupation: retired    Fish farm manager: NOT  EMPLOYED  Tobacco Use  . Smoking status: Never Smoker  . Smokeless tobacco: Never Used  Vaping Use  . Vaping Use: Never used  Substance and Sexual Activity  . Alcohol use: Yes    Comment: occasional beer on the weekends  . Drug use: No  . Sexual activity: Yes    Birth control/protection: Surgical  Other Topics Concern  . Not on file  Social History Narrative   Caffeine: 2-3 cups/day (1-2 coffee); occasional beer   Lives with husband and 24yo son, 4 dogs   Activity: no scheduled - enjoys walking, but just usually run around doing shopping etc. Does not do routine exercise.   Diet: lots of water, fruits/vegetable   Social Determinants of Health   Financial Resource Strain: Low Risk   . Difficulty of Paying Living Expenses: Not hard at all  Food Insecurity: No Food Insecurity  . Worried About Charity fundraiser in the Last Year: Never true  . Ran Out of Food in the Last Year: Never true  Transportation Needs: No Transportation Needs  . Lack of Transportation  (Medical): No  . Lack of Transportation (Non-Medical): No  Physical Activity: Inactive  . Days of Exercise per Week: 0 days  . Minutes of Exercise per Session: 0 min  Stress: No Stress Concern Present  . Feeling of Stress : Not at all  Social Connections: Not on file  Intimate Partner Violence: Not At Risk  . Fear of Current or Ex-Partner: No  . Emotionally Abused: No  . Physically Abused: No  . Sexually Abused: No   Family History  Problem Relation Age of Onset  . Coronary artery disease Mother 51       CABG and valve replacement  . Arthritis Mother   . Valvular heart disease Mother        Had valve replacement  . Coronary artery disease Father 13       CAD/MI  . Hypertension Father   . Heart disease Father   . Diabetes Sister   . Hypertension Sister   . Hypertension Brother   . Coronary artery disease Brother        Stent  . Coronary artery disease Brother        Stents  . Hypertension Brother   . Hypertension Sister   . Mitral valve prolapse Sister   . Hypertension Sister   . Thyroid disease Sister   . Colon polyps Sister   . Irritable bowel syndrome Sister   . Diabetes Sister   . Diabetes Maternal Grandfather   . Thyroid disease Son   . Cancer Neg Hx   . Colon cancer Neg Hx   . Esophageal cancer Neg Hx   . Inflammatory bowel disease Neg Hx   . Liver disease Neg Hx   . Pancreatic cancer Neg Hx   . Rectal cancer Neg Hx   . Stomach cancer Neg Hx    I have reviewed her medical, social, and family history in detail and updated the electronic medical record as necessary.    PHYSICAL EXAMINATION  BP (!) 144/72 (BP Location: Left Arm, Patient Position: Sitting, Cuff Size: Normal)   Pulse 72   Ht 5' 2.75" (1.594 m) Comment: height measured without shoes  Wt 182 lb (82.6 kg)   BMI 32.50 kg/m  Wt Readings from Last 3 Encounters:  10/26/20 182 lb (82.6 kg)  08/07/20 179 lb (81.2 kg)  07/19/20 179 lb (81.2 kg)  GEN: NAD, appears stated age, doesn't  appear  chronically ill PSYCH: Cooperative, without pressured speech EYE: Conjunctivae pink, sclerae anicteric ENT: MMM CV: Nontachycardic RESP: No audible wheezing GI: NABS, soft, surgical scars present, NT/ND, without rebound or guarding GU: DRE deferred as colonoscopy is recommended MSK/EXT: No lower extremity edema SKIN: No jaundice NEURO:  Alert & Oriented x 3, no focal deficits   REVIEW OF DATA  I reviewed the following data at the time of this encounter:  GI Procedures and Studies  2013 colonoscopy Anastomosis at 40 cm that was widely patent and normal.  Scattered residual diverticulum noted.  No inflammation or stricture noted.  No polyps or cancers were seen.  Retroflexed views in the rectum revealed no abnormalities.  2013 EGD Moderate gastritis was found in the antrum.  CLO biopsies performed.  Mild erythema and erosions.  There were multiple polyps identified consistent with benign fundal hyperplastic polyps.  Retroflexed views revealed no abnormalities.  The scope was withdrawn.  Laboratory Studies  Reviewed those in epic  Imaging Studies  No relevant studies to review   ASSESSMENT  Ms. Pawlicki is a 65 y.o. female with a pmh significant for arthritis, hypertension, hyperlipidemia, hypothyroidism, migraines, sleep apnea, allergies, diverticulosis and prior diverticulitis (status post colonic resection for diverticulitis), GERD.  The patient is seen today for evaluation and management of:  1. Change in bowel habits   2. Change in stool caliber   3. Gastroesophageal reflux disease, unspecified whether esophagitis present   4. Diverticulosis   5. History of diverticulitis   6. History of partial colectomy    The patient is hemodynamically stable and clinically stable.  Etiology of patient's symptoms few months ago has not been clearly defined although she did have some internal hemorrhoids on anoscopy.  Certainly the change in her bowel habits is something we need to further  evaluate in the setting of her previous diverticulitis and colectomy and anastomosis.  We want to consider biopsies to rule out proctitis and/or colitis issues.  In the setting of her longstanding acid reflux we will evaluate for potential risk of Barrett's esophagus and proceed with a diagnostic endoscopy as well.  I have asked the patient to initiate fiber supplementation in an effort of bulking her stool to see if that will make a difference.  Metamucil or FiberCon can be used.  Further evaluation and recommendations such as defecography or anorectal manometry can be considered based on the patient's symptoms moving forward after endoscopic evaluation.  The risks and benefits of endoscopic evaluation were discussed with the patient; these include but are not limited to the risk of perforation, infection, bleeding, missed lesions, lack of diagnosis, severe illness requiring hospitalization, as well as anesthesia and sedation related illnesses.  The patient is agreeable to proceed.  All patient questions were answered to the best of my ability, and the patient agrees to the aforementioned plan of action with follow-up as indicated.   PLAN  Colonoscopy for evaluation of change in bowel habits Endoscopy for Barrett's screening Trial continued Metamucil through the month of February but if ineffective then transition to [place] 1 to 2 tablets daily Toileting techniques discussed   Orders Placed This Encounter  Procedures  . Ambulatory referral to Gastroenterology    New Prescriptions   NA SULFATE-K SULFATE-MG SULF (SUPREP BOWEL PREP KIT) 17.5-3.13-1.6 GM/177ML SOLN    Take 1 kit by mouth as directed. For colonoscopy prep   Modified Medications   No medications on file    Planned Follow Up No follow-ups on file.  Total Time in Face-to-Face and in Coordination of Care for patient including independent/personal interpretation/review of prior testing, medical history, examination, medication  adjustment, communicating results with the patient directly, and documentation with the EHR is 45 minutes.   Justice Britain, MD [facility] Office # [phone]

## 2020-11-03 ENCOUNTER — Ambulatory Visit: Payer: Medicare Other | Admitting: Cardiology

## 2020-11-03 ENCOUNTER — Encounter: Payer: Self-pay | Admitting: Cardiology

## 2020-11-03 ENCOUNTER — Other Ambulatory Visit: Payer: Self-pay

## 2020-11-03 VITALS — BP 130/70 | HR 72 | Ht 62.75 in | Wt 183.0 lb

## 2020-11-03 DIAGNOSIS — I10 Essential (primary) hypertension: Secondary | ICD-10-CM | POA: Diagnosis not present

## 2020-11-03 DIAGNOSIS — E669 Obesity, unspecified: Secondary | ICD-10-CM

## 2020-11-03 DIAGNOSIS — E785 Hyperlipidemia, unspecified: Secondary | ICD-10-CM

## 2020-11-03 DIAGNOSIS — R002 Palpitations: Secondary | ICD-10-CM | POA: Diagnosis not present

## 2020-11-03 NOTE — Progress Notes (Signed)
Primary Care Provider: Ria Bush, MD Cardiologist: No primary care provider on file. Electrophysiologist: None  Clinic Note: Chief Complaint  Patient presents with  . Follow-up    Annual visit.  Notes that she was just started on a statin by PCP.  Marland Kitchen Palpitations    Occur but not worrisome.  Usually when stressed   =================================== ASSESSMENT/PLAN   Problem List Items Addressed This Visit    Essential hypertension (Chronic)    Blood pressure looks pretty good.  She is no longer on metoprolol, only using it as needed.  She is on losartan.      Relevant Orders   EKG 12-Lead (Completed)   Hyperlipidemia with target LDL less than 100 (Chronic)    10-year cardiovascular risk of roughly 7%  Her lipid panel in November was poorly controlled with a total cholesterol 230 and LDL of 152.  Appropriately started on a statin with her family history and hypertension.  She sees me tolerating well.  She should be having labs checked relatively soon to see if it is working.      Obesity, Class I, BMI 30-34.9 (Chronic)    The patient understands the need to lose weight with diet and exercise. We have discussed specific strategies for this.      Palpitations - Primary (Chronic)    Pretty stable.  Not requiring PRN Metoprolol.      Relevant Orders   EKG 12-Lead (Completed)     =================================== HPI:    Brittany [name] is a 65 y.o. female with a PMH notable for OSA, HTN and fatty liver disease who presents today for annual follow-up (along with her husband Brittany [name].  Brittany [name] was last seen on December 02, 2019-was doing very well.  Occasional palpitations off and on but less than previously.  She says that the palpitations because her blood pressure dropped and she gets a little dizzy.  But no syncope or near syncope.  Not as active because of the COVID-19 lockdown.  (Shortness of breath with long conversations, hip and knee pain)  Recent  Hospitalizations: None  She and her husband both had COVID-19 in January -> they did find because they had both vaccine injections and booster.  They really did have that much of a bad course at all.  Reviewed  CV studies:    The following studies were reviewed today: (if available, images/films reviewed: From Epic Chart or Care Everywhere) . None:   Interval History:   Brittany [name] returns here today for annual follow-up stating that she was started on a statin by her PCP and the plan was to recheck in 6 months.  She seems to be doing okay with it.  The problem was that she acknowledges leading up to the visit with her PCP, she had been eating probably not the prep or things.  She was eating lots of bread and butter because a family member was making homemade bread that was very good but she was probably eating more than she should.  Despite eating lots of high calorie food, she really has not put on a lot of weight which is helpful.  She has had some GERD.  She says she has palpitations every now and then the and come and go without any real rhyme or reason but oftentimes associated with when she is lying down to try to extend down for the day and her mind is racing.  When this happens, her heart rate goes up as well.  The heart rate feels regular, but she sometimes feels a forceful beats.  She also notes that she probably has not been drinking well and she gets cramps and lightheaded when she does drinking of water.  CV Review of Symptoms (Summary) Cardiovascular ROS: no chest pain or dyspnea on exertion positive for - palpitations, rapid heart rate and Little lightheadedness and dizziness with some cramping mostly associated with anxiety, or dehydration. negative for - orthopnea, paroxysmal nocturnal dyspnea, shortness of breath or Syncope or near syncope, TIA/amaurosis fugax, claudication  The patient does not have symptoms concerning for COVID-19 infection (fever, chills, cough, or new  shortness of breath).   REVIEWED OF SYSTEMS   Review of Systems  Constitutional: Negative for malaise/fatigue and weight loss.  HENT: Negative for congestion and nosebleeds.   Respiratory: Negative for shortness of breath.   Gastrointestinal: Positive for heartburn.  Musculoskeletal: Positive for myalgias (Cramping she does not drink enough.). Negative for joint pain.  Neurological: Negative for dizziness (Only when dehydrated), weakness and headaches.  Psychiatric/Behavioral: The patient is nervous/anxious (Her mind wanders and races at night).     I have reviewed and (if needed) personally updated the patient's problem list, medications, allergies, past medical and surgical history, social and family history.   PAST MEDICAL HISTORY   Past Medical History:  Diagnosis Date  . Arthritis    neck pain and numbness left digits Brittany [name])  . Chronic lower back pain    persistent after L1 cmp fx, with multilevel mild lumbar DDD - good resolution after L L5/S1 ESI (Brittany [name])  . Colon polyps    last colonoscopy 2006? rpt due  . Diverticulitis   . Fatty liver 02/2012   on CT scan 02/2012  . GERD (gastroesophageal reflux disease)   . History of diverticulitis of colon    s/p resection of large intestine, rpt itis 02/2012  . HLD (hyperlipidemia)   . HTN (hypertension)   . Hypothyroidism   . Migraines    occasional, stress related  . OSA (obstructive sleep apnea) 09/2016   mild to moderate, consider CPAP  . Postmenopausal    HRT compound, previously premarin  . Seasonal allergies   . Vertebral compression fracture ([place]) 07/2012   L1 25% Brittany [name]) released without intervention    PAST SURGICAL HISTORY   Past Surgical History:  Procedure Laterality Date  . ANTERIOR FUSION CERVICAL SPINE  01/1999   cervical HNP  . COLON RESECTION  2008   2 ft removed, diverticulitis  . COLONOSCOPY  03/2012   partial colectomy, scattered diverticulae Brittany [name]), rpt 10 yrs  . ESI Left 06/2013, 12/2013,  09/2014   L5/S1 with good resolution of pain  . ESOPHAGOGASTRODUODENOSCOPY  03/2012   mod gastritis, benign polyps, H pylori neg  . FOOT SURGERY Left 01/2014   big toe fusion  . KNEE SURGERY  1995, 2011   torn menisci (MRI 2010 L)  . SPIROMETRY  2011   normal  . TONSILLECTOMY  1964  . TOTAL ABDOMINAL HYSTERECTOMY W/ BILATERAL SALPINGOOPHORECTOMY  06/2009   vag bleeding  . US ECHOCARDIOGRAPHY  08/2010   normal, EF 63%, mild valvular issues    Immunization History  Administered Date(s) Administered  . Influenza Inj Mdck Quad Pf 06/01/2018  . Influenza Split 09/26/2011  . Influenza,inj,Quad PF,6+ Mos 06/12/2016, 06/01/2018, 06/03/2019  . Influenza-Unspecified 06/10/2015, 06/27/2017, 06/24/2020  . PFIZER(Purple Top)SARS-COV-2 Vaccination 12/03/2019, 12/28/2019, 07/01/2020  . Tdap 09/26/2011  . Zoster 06/12/2016    MEDICATIONS/ALLERGIES   Current Meds  Medication Sig  .  Ascorbic Acid (VITAMIN C) 1000 MG tablet Take 1,000 mg by mouth daily.  Marland Kitchen atorvastatin (LIPITOR) 20 MG tablet Take 1 tablet (20 mg total) by mouth daily.  . Cholecalciferol (VITAMIN D3) 25 MCG (1000 UT) CAPS Take 1 capsule (1,000 Units total) by mouth daily.  Marland Kitchen gabapentin (NEURONTIN) 600 MG tablet 600 mg at bedtime.  Marland Kitchen ibuprofen (ADVIL,MOTRIN) 200 MG tablet Take 600 mg by mouth as needed.  Marland Kitchen levothyroxine (SYNTHROID) 100 MCG tablet Take 1 tablet (100 mcg total) by mouth daily before breakfast.  . losartan (COZAAR) 50 MG tablet Take 1 tablet (50 mg total) by mouth daily.  . Magnesium 200 MG TABS Take 1 tablet (200 mg total) by mouth daily.  . Multiple Vitamins-Minerals (ALIVE WOMENS ENERGY PO) Take 1 tablet by mouth daily.  . Na Sulfate-K Sulfate-Mg Sulf (SUPREP BOWEL PREP KIT) 17.5-3.13-1.6 GM/177ML SOLN Take 1 kit by mouth as directed. For colonoscopy prep  . Omega-3 Fatty Acids (FISH OIL PO) Take by mouth daily. Takes 2,400 mg  . omeprazole (PRILOSEC) 40 MG capsule Take 1 capsule (40 mg total) by mouth daily.  .  Potassium 99 MG TABS Take 1 tablet (99 mg total) by mouth daily.  . traZODone (DESYREL) 100 MG tablet Take 1 tablet (100 mg total) by mouth at bedtime.  Marland Kitchen zinc gluconate 50 MG tablet Take 1 tablet (50 mg total) by mouth daily.    Allergies  Allergen Reactions  . Cymbalta [Duloxetine Hcl] Other (See Comments)    suicidality  . Percocet [Oxycodone-Acetaminophen] Nausea And Vomiting and Other (See Comments)    Nauseated, dizzy    SOCIAL HISTORY/FAMILY HISTORY   Reviewed in Epic:  Pertinent findings:  Social History   Tobacco Use  . Smoking status: Never Smoker  . Smokeless tobacco: Never Used  Vaping Use  . Vaping Use: Never used  Substance Use Topics  . Alcohol use: Yes    Comment: occasional beer on the weekends  . Drug use: No   Social History   Social History Narrative   Caffeine: 2-3 cups/day (1-2 coffee); occasional beer   Lives with husband and 80yo son, 4 dogs   Activity: no scheduled - enjoys walking, but just usually run around doing shopping etc. Does not do routine exercise.   Diet: lots of water, fruits/vegetable    OBJCTIVE -PE, EKG, labs   Wt Readings from Last 3 Encounters:  11/03/20 183 lb (83 kg)  10/26/20 182 lb (82.6 kg)  08/07/20 179 lb (81.2 kg)    Physical Exam: BP 130/70 (BP Location: Left Arm, Patient Position: Sitting, Cuff Size: Normal)   Pulse 72   Ht 5' 2.75" (1.594 m)   Wt 183 lb (83 kg)   SpO2 98%   BMI 32.68 kg/m  Physical Exam Vitals reviewed.  Constitutional:      General: She is not in acute distress.    Appearance: Normal appearance. She is not ill-appearing or toxic-appearing.  HENT:     Head: Normocephalic and atraumatic.  Neck:     Vascular: No carotid bruit.  Cardiovascular:     Rate and Rhythm: Normal rate and regular rhythm.  No extrasystoles are present.    Chest Wall: PMI is not displaced.     Pulses: Normal pulses.     Heart sounds: Normal heart sounds. No murmur heard. No friction rub. No gallop.    Pulmonary:     Effort: Pulmonary effort is normal. No respiratory distress.     Breath sounds: Normal breath sounds.  Chest:     Chest wall: No tenderness.  Musculoskeletal:        General: No swelling. Normal range of motion.     Cervical back: Normal range of motion and neck supple.  Neurological:     General: No focal deficit present.     Mental Status: She is alert and oriented to person, place, and time.  Psychiatric:        Mood and Affect: Mood normal.        Thought Content: Thought content normal.        Judgment: Judgment normal.     Adult ECG Report  Rate: 72 ;  Rhythm: normal sinus rhythm and Incomplete RBBB.  Otherwise normal axis, intervals and durations.;   Narrative Interpretation: Stable   Recent Labs: Reviewed Lab Results  Component Value Date   CHOL 230 (H) 07/14/2020   HDL 47.20 07/14/2020   LDLCALC 152 (H) 07/14/2020   LDLDIRECT 154.0 08/14/2012   TRIG 151.0 (H) 07/14/2020   CHOLHDL 5 07/14/2020   Lab Results  Component Value Date   CREATININE 1.00 07/14/2020   BUN 13 07/14/2020   NA 140 07/14/2020   K 4.3 07/14/2020   CL 103 07/14/2020   CO2 30 07/14/2020   CBC Latest Ref Rng & Units 06/09/2019 07/31/2016 02/28/2016  WBC 4.0 - 10.5 K/uL 5.8 9.1 7.4  Hemoglobin 12.0 - 15.0 g/dL 13.4 13.7 13.0  Hematocrit 36.0 - 46.0 % 40.9 40.8 -  Platelets 150.0 - 400.0 K/uL 210.0 257 -    Lab Results  Component Value Date   TSH 3.73 07/14/2020    ==================================================  COVID-19 Education: The signs and symptoms of COVID-19 were discussed with the patient and how to seek care for testing (follow up with PCP or arrange E-visit).   The importance of social distancing and COVID-19 vaccination was discussed today. The patient is practicing social distancing & Masking.   I spent a total of 65mnutes with the patient spent in direct patient consultation.  Additional time spent with chart review  / charting (studies, outside  notes, etc): 8 min Total Time: 23 min   Current medicines are reviewed at length with the patient today.  (+/- concerns) n/a  This visit occurred during the SARS-CoV-2 public health emergency.  Safety protocols were in place, including screening questions prior to the visit, additional usage of staff PPE, and extensive cleaning of exam room while observing appropriate contact time as indicated for disinfecting solutions.  Notice: This dictation was prepared with Dragon dictation along with smaller phrase technology. Any transcriptional errors that result from this process are unintentional and may not be corrected upon review.  Patient Instructions / Medication Changes & Studies & Tests Ordered   Patient Instructions  Medication Instructions:   No changes   *If you need a refill on your cardiac medications before your next appointment, please call your pharmacy*   Lab Work: Not needed    Testing/Procedures: Not needed   Follow-Up: At [facility] you and your health needs are our priority.  As part of our continuing mission to provide you with exceptional heart care, we have created designated Provider Care Teams.  These Care Teams include your primary Cardiologist (physician) and Advanced Practice Providers (APPs -  Physician Assistants and Nurse Practitioners) who all work together to provide you with the care you need, when you need it.     Your next appointment:   12 month(s)  The format for your next appointment:  In Person  Provider:   Glenetta Hew, MD    Studies Ordered:   Orders Placed This Encounter  Procedures  . EKG 12-Lead     Glenetta Hew, M.D., M.S. Interventional Cardiologist   Pager # [phone] Phone # [phone] [address] [place], [place] [address]   Thank you for choosing [facility] at [facility]!!

## 2020-11-03 NOTE — Patient Instructions (Signed)

## 2020-11-27 ENCOUNTER — Encounter: Payer: Self-pay | Admitting: Cardiology

## 2020-11-27 NOTE — Assessment & Plan Note (Addendum)
10-year cardiovascular risk of roughly 7%  Her lipid panel in November was poorly controlled with a total cholesterol 230 and LDL of 152.  Appropriately started on a statin with her family history and hypertension.  She sees me tolerating well.  She should be having labs checked relatively soon to see if it is working.

## 2020-11-27 NOTE — Assessment & Plan Note (Signed)
The patient understands the need to lose weight with diet and exercise. We have discussed specific strategies for this.  

## 2020-11-27 NOTE — Assessment & Plan Note (Signed)
Pretty stable.  Not requiring PRN Metoprolol.

## 2020-11-27 NOTE — Assessment & Plan Note (Signed)
Blood pressure looks pretty good.  She is no longer on metoprolol, only using it as needed.  She is on losartan.

## 2020-12-01 ENCOUNTER — Other Ambulatory Visit: Payer: Self-pay | Admitting: Family Medicine

## 2020-12-01 DIAGNOSIS — Z1231 Encounter for screening mammogram for malignant neoplasm of breast: Secondary | ICD-10-CM

## 2020-12-08 HISTORY — PX: COLONOSCOPY: SHX174

## 2020-12-08 HISTORY — PX: ESOPHAGOGASTRODUODENOSCOPY: SHX1529

## 2020-12-14 ENCOUNTER — Other Ambulatory Visit: Payer: Self-pay

## 2020-12-14 ENCOUNTER — Other Ambulatory Visit: Payer: Self-pay | Admitting: Gastroenterology

## 2020-12-14 ENCOUNTER — Ambulatory Visit (AMBULATORY_SURGERY_CENTER): Payer: Medicare Other | Admitting: Gastroenterology

## 2020-12-14 ENCOUNTER — Encounter: Payer: Self-pay | Admitting: Gastroenterology

## 2020-12-14 VITALS — BP 117/66 | HR 79 | Temp 96.6°F | Resp 15 | Ht 62.0 in | Wt 182.0 lb

## 2020-12-14 DIAGNOSIS — R194 Change in bowel habit: Secondary | ICD-10-CM | POA: Diagnosis not present

## 2020-12-14 DIAGNOSIS — K317 Polyp of stomach and duodenum: Secondary | ICD-10-CM

## 2020-12-14 DIAGNOSIS — K219 Gastro-esophageal reflux disease without esophagitis: Secondary | ICD-10-CM

## 2020-12-14 DIAGNOSIS — K5730 Diverticulosis of large intestine without perforation or abscess without bleeding: Secondary | ICD-10-CM | POA: Diagnosis not present

## 2020-12-14 DIAGNOSIS — R195 Other fecal abnormalities: Secondary | ICD-10-CM

## 2020-12-14 DIAGNOSIS — K3189 Other diseases of stomach and duodenum: Secondary | ICD-10-CM | POA: Diagnosis not present

## 2020-12-14 DIAGNOSIS — K641 Second degree hemorrhoids: Secondary | ICD-10-CM

## 2020-12-14 DIAGNOSIS — K2970 Gastritis, unspecified, without bleeding: Secondary | ICD-10-CM

## 2020-12-14 DIAGNOSIS — Z9049 Acquired absence of other specified parts of digestive tract: Secondary | ICD-10-CM

## 2020-12-14 MED ORDER — SODIUM CHLORIDE 0.9 % IV SOLN: 500.0000 mL | Freq: Once | INTRAVENOUS | Status: DC

## 2020-12-14 NOTE — Patient Instructions (Signed)
Start a high fiber diet, use fibercon 1-2 tablets by mouth daily. Continue present medications. Await pathology results, repeat colonoscopy in 10 years for screening purposes.  YOU HAD AN ENDOSCOPIC PROCEDURE TODAY AT [facility]:   Refer to the procedure report that was given to you for any specific questions about what was found during the examination.  If the procedure report does not answer your questions, please call your gastroenterologist to clarify.  If you requested that your care partner not be given the details of your procedure findings, then the procedure report has been included in a sealed envelope for you to review at your convenience later.  YOU SHOULD EXPECT: Some feelings of bloating in the abdomen. Passage of more gas than usual.  Walking can help get rid of the air that was put into your GI tract during the procedure and reduce the bloating. If you had a lower endoscopy (such as a colonoscopy or flexible sigmoidoscopy) you may notice spotting of blood in your stool or on the toilet paper. If you underwent a bowel prep for your procedure, you may not have a normal bowel movement for a few days.  Please Note:  You might notice some irritation and congestion in your nose or some drainage.  This is from the oxygen used during your procedure.  There is no need for concern and it should clear up in a day or so.  SYMPTOMS TO REPORT IMMEDIATELY:   Following lower endoscopy (colonoscopy or flexible sigmoidoscopy):  Excessive amounts of blood in the stool  Significant tenderness or worsening of abdominal pains  Swelling of the abdomen that is new, acute  Fever of 100F or higher   Following upper endoscopy (EGD)  Vomiting of blood or coffee ground material  New chest pain or pain under the shoulder blades  Painful or persistently difficult swallowing  New shortness of breath  Fever of 100F or higher  Black, tarry-looking stools  For urgent or emergent issues, a  gastroenterologist can be reached at any hour by calling [phone]. Do not use MyChart messaging for urgent concerns.    DIET:  We do recommend a small meal at first, but then you may proceed to your regular diet.  Drink plenty of fluids but you should avoid alcoholic beverages for 24 hours.  ACTIVITY:  You should plan to take it easy for the rest of today and you should NOT DRIVE or use heavy machinery until tomorrow (because of the sedation medicines used during the test).    FOLLOW UP: Our staff will call the number listed on your records 48-72 hours following your procedure to check on you and address any questions or concerns that you may have regarding the information given to you following your procedure. If we do not reach you, we will leave a message.  We will attempt to reach you two times.  During this call, we will ask if you have developed any symptoms of COVID 19. If you develop any symptoms (ie: fever, flu-like symptoms, shortness of breath, cough etc.) before then, please call [phone].  If you test positive for Covid 19 in the 2 weeks post procedure, please call and report this information to [place].    If any biopsies were taken you will be contacted by phone or by letter within the next 1-3 weeks.  Please call us at [phone] if you have not heard about the biopsies in 3 weeks.    SIGNATURES/CONFIDENTIALITY: You and/or your  care partner have signed paperwork which will be entered into your electronic medical record.  These signatures attest to the fact that that the information above on your After Visit Summary has been reviewed and is understood.  Full responsibility of the confidentiality of this discharge information lies with you and/or your care-partner.

## 2020-12-14 NOTE — Progress Notes (Signed)
[facility] 

## 2020-12-14 NOTE — Op Note (Signed)
[facility] Patient Name: Brittany [name] Procedure Date: 12/14/2020 1:31 PM MRN: [id number] Endoscopist: Justice Britain , MD Age: 65 Referring MD:  Date of Birth: 05/12/1956 Gender: Female Account #: [id number] Procedure:                Colonoscopy Indications:              Screening for colorectal malignant neoplasm,                            Incidental change in bowel habits noted Medicines:                Monitored Anesthesia Care Procedure:                Pre-Anesthesia Assessment:                           - Prior to the procedure, a History and Physical                            was performed, and patient medications and                            allergies were reviewed. The patient's tolerance of                            previous anesthesia was also reviewed. The risks                            and benefits of the procedure and the sedation                            options and risks were discussed with the patient.                            All questions were answered, and informed consent                            was obtained. Prior Anticoagulants: The patient has                            taken no previous anticoagulant or antiplatelet                            agents except for NSAID medication. ASA Grade                            Assessment: II - A patient with mild systemic                            disease. After reviewing the risks and benefits,                            the patient was deemed in satisfactory condition to  undergo the procedure.                           After obtaining informed consent, the colonoscope                            was passed under direct vision. Throughout the                            procedure, the patient's blood pressure, pulse, and                            oxygen saturations were monitored continuously. The                            Olympus CF-HQ190L [id number]) Colonoscope  was                            introduced through the anus and advanced to the 8                            cm into the ileum. The colonoscopy was performed                            without difficulty. The patient tolerated the                            procedure. The quality of the bowel preparation was                            adequate. The terminal ileum, ileocecal valve,                            appendiceal orifice, and rectum were photographed. Scope In: 1:48:00 PM Scope Out: 2:01:48 PM Scope Withdrawal Time: 0 hours 11 minutes 40 seconds  Total Procedure Duration: 0 hours 13 minutes 48 seconds  Findings:                 The digital rectal exam findings include                            hemorrhoids. Pertinent negatives include no                            palpable rectal lesions.                           The terminal ileum and ileocecal valve appeared                            normal.                           Many small and large-mouthed diverticula were found  in the recto-sigmoid colon, sigmoid colon,                            descending colon, transverse colon, hepatic flexure                            and ascending colon.                           There was evidence of a prior functional end-to-end                            colo-colonic anastomosis in the recto-sigmoid                            colon. This was patent and was characterized by                            healthy appearing mucosa. The anastomosis was                            traversed.                           Normal mucosa was found in the entire colon                            otherwise. Biopsies for histology were taken with a                            cold forceps from the entire colon for evaluation                            of microscopic colitis.                           Non-bleeding non-thrombosed external and internal                            hemorrhoids  were found during retroflexion, during                            perianal exam and during digital exam. The                            hemorrhoids were Grade II (internal hemorrhoids                            that prolapse but reduce spontaneously). Complications:            No immediate complications. Estimated Blood Loss:     Estimated blood loss was minimal. Impression:               - Hemorrhoids found on digital rectal exam.                           -  The examined portion of the ileum was normal.                           - Diverticulosis in the recto-sigmoid colon, in the                            sigmoid colon, in the descending colon, in the                            transverse colon, at the hepatic flexure and in the                            ascending colon.                           - Patent functional end-to-end colo-colonic                            anastomosis, characterized by healthy appearing                            mucosa.                           - Normal mucosa in the entire examined colon                            otherwise. Biopsied.                           - Non-bleeding non-thrombosed external and internal                            hemorrhoids. Recommendation:           - The patient will be observed post-procedure,                            until all discharge criteria are met.                           - Discharge patient to home.                           - Patient has a contact number available for                            emergencies. The signs and symptoms of potential                            delayed complications were discussed with the                            patient. Return to normal activities tomorrow.                            Written  discharge instructions were provided to the                            patient.                           - High fiber diet.                           - Use FiberCon 1-2 tablets PO daily.                            - Continue present medications.                           - Await pathology results.                           - Repeat colonoscopy in 10 years for screening                            purposes.                           - The findings and recommendations were discussed                            with the patient.                           - The findings and recommendations were discussed                            with the patient's family. Justice Britain, MD 12/14/2020 2:11:17 PM

## 2020-12-14 NOTE — Progress Notes (Signed)
Called to room to assist during endoscopic procedure.  Patient ID and intended procedure confirmed with present staff. Received instructions for my participation in the procedure from the performing physician.  

## 2020-12-14 NOTE — Op Note (Signed)
[facility] Patient Name: Brittany [name] Procedure Date: 12/14/2020 1:32 PM MRN: [id number] Endoscopist: Justice Britain , MD Age: 65 Referring MD:  Date of Birth: May 29, 1956 Gender: Female Account #: [id number] Procedure:                Upper GI endoscopy Indications:              Heartburn, Screening for Barrett's esophagus in                            patient at risk for this condition, Change in bowel                            habits Medicines:                Monitored Anesthesia Care Procedure:                Pre-Anesthesia Assessment:                           - Prior to the procedure, a History and Physical                            was performed, and patient medications and                            allergies were reviewed. The patient's tolerance of                            previous anesthesia was also reviewed. The risks                            and benefits of the procedure and the sedation                            options and risks were discussed with the patient.                            All questions were answered, and informed consent                            was obtained. Prior Anticoagulants: The patient has                            taken no previous anticoagulant or antiplatelet                            agents except for NSAID medication. ASA Grade                            Assessment: II - A patient with mild systemic                            disease. After reviewing the risks and benefits,  the patient was deemed in satisfactory condition to                            undergo the procedure.                           After obtaining informed consent, the endoscope was                            passed under direct vision. Throughout the                            procedure, the patient's blood pressure, pulse, and                            oxygen saturations were monitored continuously. The                             Endoscope was introduced through the mouth, and                            advanced to the second part of duodenum. The upper                            GI endoscopy was accomplished without difficulty.                            The patient tolerated the procedure. Scope In: Scope Out: Findings:                 No gross lesions were noted in the entire esophagus.                           The Z-line was regular and was found 37 cm from the                            incisors.                           A non-obstructing Schatzki ring was found at the                            gastroesophageal junction.                           A 3 cm hiatal hernia was present.                           Multiple small semi-sessile polyps with no stigmata                            of recent bleeding were found in the gastric fundus                            and in the gastric body. Biopsies  were taken with a                            cold forceps for histology.                           Patchy mildly erythematous mucosa without bleeding                            was found in the gastric body and in the gastric                            antrum.                           No other gross lesions were noted in the entire                            examined stomach. Biopsies were taken with a cold                            forceps for histology and Helicobacter pylori                            testing.                           No gross lesions were noted in the duodenal bulb,                            in the first portion of the duodenum and in the                            second portion of the duodenum. Biopsies for                            histology were taken with a cold forceps for                            evaluation of celiac disease. Complications:            No immediate complications. Estimated Blood Loss:     Estimated blood loss was minimal. Impression:               - No  gross lesions in esophagus. Z-line regular, 37                            cm from the incisors.                           - Non-obstructing Schatzki ring.                           - 3 cm hiatal hernia.                           -  Multiple gastric polyps - likely fundic gland,                            biopsied.                           - Erythematous mucosa in the gastric body and                            antrum. No other gross lesions in the stomach.                            Biopsied.                           - No gross lesions in the duodenal bulb, in the                            first portion of the duodenum and in the second                            portion of the duodenum. Biopsied. Recommendation:           - Proceed to scheduled colonoscopy.                           - Observe patient's clinical course.                           - Continue present medications.                           - Await pathology results.                           - The findings and recommendations were discussed                            with the patient.                           - The findings and recommendations were discussed                            with the patient's family. Justice Britain, MD 12/14/2020 2:07:57 PM

## 2020-12-14 NOTE — Progress Notes (Signed)
Report given to PACU, vss 

## 2020-12-14 NOTE — Progress Notes (Signed)
1323 Robinul 0.1 mg IV given due large amount of secretions upon assessment.  MD made aware, vss

## 2020-12-18 ENCOUNTER — Telehealth: Payer: Self-pay

## 2020-12-18 NOTE — Telephone Encounter (Signed)
  Follow up Call-  Call back number 12/14/2020  Post procedure Call Back phone  # [phone]  Permission to leave phone message Yes  Some recent data might be hidden     Patient questions:  Do you have a fever, pain , or abdominal swelling? No. Pain Score  0 *  Have you tolerated food without any problems? Yes.    Have you been able to return to your normal activities? Yes.    Do you have any questions about your discharge instructions: Diet   No. Medications  No. Follow up visit  No.  Do you have questions or concerns about your Care? No.  Actions: * If pain score is 4 or above: No action needed, pain <4.

## 2020-12-18 NOTE — Telephone Encounter (Signed)
Left message on follow up call. 

## 2020-12-21 DIAGNOSIS — M25562 Pain in left knee: Secondary | ICD-10-CM | POA: Diagnosis not present

## 2020-12-21 DIAGNOSIS — M1712 Unilateral primary osteoarthritis, left knee: Secondary | ICD-10-CM | POA: Diagnosis not present

## 2020-12-29 ENCOUNTER — Encounter: Payer: Self-pay | Admitting: Gastroenterology

## 2021-01-02 ENCOUNTER — Other Ambulatory Visit: Payer: Self-pay | Admitting: Physical Medicine and Rehabilitation

## 2021-01-02 DIAGNOSIS — M25562 Pain in left knee: Secondary | ICD-10-CM

## 2021-01-04 ENCOUNTER — Other Ambulatory Visit: Payer: Self-pay

## 2021-01-04 ENCOUNTER — Ambulatory Visit
Admission: RE | Admit: 2021-01-04 | Discharge: 2021-01-04 | Disposition: A | Discharge status: Home / Self Care | Payer: Medicare Other | Source: Ambulatory Visit | Attending: Physical Medicine and Rehabilitation | Admitting: Physical Medicine and Rehabilitation

## 2021-01-04 DIAGNOSIS — M25562 Pain in left knee: Secondary | ICD-10-CM

## 2021-01-06 DIAGNOSIS — H40033 Anatomical narrow angle, bilateral: Secondary | ICD-10-CM | POA: Diagnosis not present

## 2021-01-06 DIAGNOSIS — H2513 Age-related nuclear cataract, bilateral: Secondary | ICD-10-CM | POA: Diagnosis not present

## 2021-01-10 DIAGNOSIS — M25561 Pain in right knee: Secondary | ICD-10-CM | POA: Diagnosis not present

## 2021-01-10 DIAGNOSIS — M25562 Pain in left knee: Secondary | ICD-10-CM | POA: Diagnosis not present

## 2021-01-10 DIAGNOSIS — M170 Bilateral primary osteoarthritis of knee: Secondary | ICD-10-CM | POA: Diagnosis not present

## 2021-01-14 ENCOUNTER — Other Ambulatory Visit: Payer: Self-pay | Admitting: Family Medicine

## 2021-01-14 DIAGNOSIS — E785 Hyperlipidemia, unspecified: Secondary | ICD-10-CM

## 2021-01-15 NOTE — Telephone Encounter (Signed)
Pharmacy requests refill on: Atorvastatin 20 mg   LAST REFILL: 07/20/2020 (Q-90, R-1) LAST OV: 07/19/2020 NEXT OV: Not Scheduled  PHARMACY: [id number] [place], [place]

## 2021-01-23 ENCOUNTER — Ambulatory Visit
Admission: RE | Admit: 2021-01-23 | Discharge: 2021-01-23 | Disposition: A | Discharge status: Home / Self Care | Payer: Medicare Other | Source: Ambulatory Visit | Attending: Family Medicine | Admitting: Family Medicine

## 2021-01-23 ENCOUNTER — Other Ambulatory Visit: Payer: Self-pay

## 2021-01-23 DIAGNOSIS — Z1231 Encounter for screening mammogram for malignant neoplasm of breast: Secondary | ICD-10-CM | POA: Diagnosis not present

## 2021-03-06 DIAGNOSIS — M1712 Unilateral primary osteoarthritis, left knee: Secondary | ICD-10-CM | POA: Diagnosis not present

## 2021-03-06 DIAGNOSIS — M170 Bilateral primary osteoarthritis of knee: Secondary | ICD-10-CM | POA: Diagnosis not present

## 2021-03-19 DIAGNOSIS — M25562 Pain in left knee: Secondary | ICD-10-CM | POA: Diagnosis not present

## 2021-04-06 DIAGNOSIS — M25562 Pain in left knee: Secondary | ICD-10-CM | POA: Diagnosis not present

## 2021-04-12 DIAGNOSIS — M25561 Pain in right knee: Secondary | ICD-10-CM | POA: Diagnosis not present

## 2021-04-12 DIAGNOSIS — M1712 Unilateral primary osteoarthritis, left knee: Secondary | ICD-10-CM | POA: Diagnosis not present

## 2021-04-12 DIAGNOSIS — M170 Bilateral primary osteoarthritis of knee: Secondary | ICD-10-CM | POA: Diagnosis not present

## 2021-04-12 DIAGNOSIS — M25562 Pain in left knee: Secondary | ICD-10-CM | POA: Diagnosis not present

## 2021-04-13 DIAGNOSIS — M25562 Pain in left knee: Secondary | ICD-10-CM | POA: Diagnosis not present

## 2021-04-20 ENCOUNTER — Ambulatory Visit (INDEPENDENT_AMBULATORY_CARE_PROVIDER_SITE_OTHER): Payer: Medicare Other | Admitting: Family Medicine

## 2021-04-20 ENCOUNTER — Other Ambulatory Visit: Payer: Self-pay

## 2021-04-20 VITALS — BP 128/76 | HR 80 | Temp 98.3°F | Ht 62.75 in | Wt 179.5 lb

## 2021-04-20 DIAGNOSIS — R197 Diarrhea, unspecified: Secondary | ICD-10-CM

## 2021-04-20 DIAGNOSIS — R1011 Right upper quadrant pain: Secondary | ICD-10-CM

## 2021-04-20 LAB — CBC WITH DIFFERENTIAL/PLATELET
Basophils Absolute: 0 10*3/uL (ref 0.0–0.1)
Basophils Relative: 0.3 % (ref 0.0–3.0)
Eosinophils Absolute: 0.1 10*3/uL (ref 0.0–0.7)
Eosinophils Relative: 0.8 % (ref 0.0–5.0)
HCT: 41.6 % (ref 36.0–46.0)
Hemoglobin: 13.7 g/dL (ref 12.0–15.0)
Lymphocytes Relative: 30.6 % (ref 12.0–46.0)
Lymphs Abs: 3.2 10*3/uL (ref 0.7–4.0)
MCHC: 33 g/dL (ref 30.0–36.0)
MCV: 91.3 fl (ref 78.0–100.0)
Monocytes Absolute: 0.6 10*3/uL (ref 0.1–1.0)
Monocytes Relative: 5.7 % (ref 3.0–12.0)
Neutro Abs: 6.5 10*3/uL (ref 1.4–7.7)
Neutrophils Relative %: 62.6 % (ref 43.0–77.0)
Platelets: 266 10*3/uL (ref 150.0–400.0)
RBC: 4.56 Mil/uL (ref 3.87–5.11)
RDW: 13.2 % (ref 11.5–15.5)
WBC: 10.3 10*3/uL (ref 4.0–10.5)

## 2021-04-20 LAB — COMPREHENSIVE METABOLIC PANEL
ALT: 15 U/L (ref 0–35)
AST: 18 U/L (ref 0–37)
Albumin: 4.4 g/dL (ref 3.5–5.2)
Alkaline Phosphatase: 69 U/L (ref 39–117)
BUN: 14 mg/dL (ref 6–23)
CO2: 29 mEq/L (ref 19–32)
Calcium: 9.4 mg/dL (ref 8.4–10.5)
Chloride: 100 mEq/L (ref 96–112)
Creatinine, Ser: 0.91 mg/dL (ref 0.40–1.20)
GFR: 65.91 mL/min (ref >60.00)
Glucose, Bld: 88 mg/dL (ref 70–99)
Potassium: 4.2 mEq/L (ref 3.5–5.1)
Sodium: 137 mEq/L (ref 135–145)
Total Bilirubin: 0.8 mg/dL (ref 0.2–1.2)
Total Protein: 7 g/dL (ref 6.0–8.3)

## 2021-04-20 LAB — LIPASE: Lipase: 33 U/L (ref 11.0–59.0)

## 2021-04-20 NOTE — Progress Notes (Signed)
Patient ID: Brittany [name], female    DOB: December 29, 1955, 65 y.o.   MRN: [id number]  This visit was conducted in person.  BP 128/76   Pulse 80   Temp 98.3 F (36.8 C) (Temporal)   Ht 5' 2.75" (1.594 m)   Wt 179 lb 8 oz (81.4 kg)   SpO2 98%   BMI 32.05 kg/m    CC: Chief Complaint  Patient presents with   Nausea    X 2 days, started on Wednesday. Also states she feels "woozy"   Diarrhea    Severe on Monday and Tuesday    Hot Flashes    Interested in trying Effexor as her sister has had good results with it    Subjective:   HPI: Brittany [name] is a 65 y.o. female presenting on 04/20/2021 for Nausea (X 2 days, started on Wednesday. Also states she feels "woozy"), Diarrhea (Severe on Monday and Tuesday ), and Hot Flashes (Interested in trying Effexor as her sister has had good results with it)    Was doing well until  4 days ago .. started with diarrhea (watery and loss) 8 times a day. 2 days ago decreased po and nausea. No emesis.  No blood in stool.  No fever   No sick contacts, no known food cahnge  Pain in RUQ , achy x 3 days 4/-10/10.Marland Kitchen now less painful, more mild, only if touches area.  No chronic GI issues.  She has been keeping up with fluids  6 12 oz bottle water in last 24 hours. Minimal food intake. Still some burping and nausea. Diarrhea has resolved and gone back to normal.  Menopausal changes and hot flashes for past few years. Trazodone not helping. Stopped this.  Sister on venlafaxine helps for same issue    Relevant past medical, surgical, family and social history reviewed and updated as indicated. Interim medical history since our last visit reviewed. Allergies and medications reviewed and updated. Outpatient Medications Prior to Visit  Medication Sig Dispense Refill   Ascorbic Acid (VITAMIN C) 1000 MG tablet Take 1,000 mg by mouth daily.     atorvastatin (LIPITOR) 20 MG tablet TAKE 1 TABLET BY MOUTH EVERY DAY 90 tablet 1   Cholecalciferol  (VITAMIN D3) 25 MCG (1000 UT) CAPS Take 1 capsule (1,000 Units total) by mouth daily. 30 capsule    gabapentin (NEURONTIN) 600 MG tablet 600 mg at bedtime.     ibuprofen (ADVIL,MOTRIN) 200 MG tablet Take 600 mg by mouth as needed.     levothyroxine (SYNTHROID) 100 MCG tablet Take 1 tablet (100 mcg total) by mouth daily before breakfast. 90 tablet 3   losartan (COZAAR) 50 MG tablet Take 1 tablet (50 mg total) by mouth daily. 90 tablet 3   Magnesium 200 MG TABS Take 1 tablet (200 mg total) by mouth daily.     Multiple Vitamins-Minerals (ALIVE WOMENS ENERGY PO) Take 1 tablet by mouth daily.     Omega-3 Fatty Acids (FISH OIL PO) Take by mouth daily. Takes 2,400 mg     omeprazole (PRILOSEC) 40 MG capsule Take 1 capsule (40 mg total) by mouth daily. 90 capsule 3   zinc gluconate 50 MG tablet Take 1 tablet (50 mg total) by mouth daily.     Potassium 99 MG TABS Take 1 tablet (99 mg total) by mouth daily. (Patient not taking: Reported on 12/14/2020)  0   traZODone (DESYREL) 100 MG tablet Take 1 tablet (100 mg total) by mouth at bedtime. [place]  tablet 3   No facility-administered medications prior to visit.     Per HPI unless specifically indicated in ROS section below Review of Systems  All other systems reviewed and are negative. Objective:  BP 128/76   Pulse 80   Temp 98.3 F (36.8 C) (Temporal)   Ht 5' 2.75" (1.594 m)   Wt 179 lb 8 oz (81.4 kg)   SpO2 98%   BMI 32.05 kg/m   Wt Readings from Last 3 Encounters:  04/20/21 179 lb 8 oz (81.4 kg)  12/14/20 182 lb (82.6 kg)  11/03/20 183 lb (83 kg)      Physical Exam Constitutional:      General: She is not in acute distress.    Appearance: Normal appearance. She is well-developed. She is not ill-appearing or toxic-appearing.  HENT:     Head: Normocephalic.     Right Ear: Hearing, tympanic membrane, ear canal and external ear normal. Tympanic membrane is not erythematous, retracted or bulging.     Left Ear: Hearing, tympanic membrane, ear  canal and external ear normal. Tympanic membrane is not erythematous, retracted or bulging.     Nose: No mucosal edema or rhinorrhea.     Right Sinus: No maxillary sinus tenderness or frontal sinus tenderness.     Left Sinus: No maxillary sinus tenderness or frontal sinus tenderness.     Mouth/Throat:     Pharynx: Uvula midline.  Eyes:     General: Lids are normal. Lids are everted, no foreign bodies appreciated.     Conjunctiva/sclera: Conjunctivae normal.     Pupils: Pupils are equal, round, and reactive to light.  Neck:     Thyroid: No thyroid mass or thyromegaly.     Vascular: No carotid bruit.     Trachea: Trachea normal.  Cardiovascular:     Rate and Rhythm: Normal rate and regular rhythm.     Pulses: Normal pulses.     Heart sounds: Normal heart sounds, S1 normal and S2 normal. No murmur heard.   No friction rub. No gallop.  Pulmonary:     Effort: Pulmonary effort is normal. No tachypnea or respiratory distress.     Breath sounds: Normal breath sounds. No decreased breath sounds, wheezing, rhonchi or rales.  Abdominal:     General: Bowel sounds are normal.     Palpations: Abdomen is soft.     Tenderness: There is no abdominal tenderness.  Musculoskeletal:     Cervical back: Normal range of motion and neck supple.  Skin:    General: Skin is warm and dry.     Findings: No rash.  Neurological:     Mental Status: She is alert.  Psychiatric:        Mood and Affect: Mood is not anxious or depressed.        Speech: Speech normal.        Behavior: Behavior normal. Behavior is cooperative.        Thought Content: Thought content normal.        Judgment: Judgment normal.      Results for orders placed or performed in visit on 07/14/20  Vitamin B12  Result Value Ref Range   Vitamin B-12 >1526 (H) 211 - 911 pg/mL  TSH  Result Value Ref Range   TSH 3.73 0.35 - 4.50 uIU/mL  Comprehensive metabolic panel  Result Value Ref Range   Sodium 140 135 - 145 mEq/L   Potassium 4.3  3.5 - 5.1 mEq/L   Chloride 103 96 -  112 mEq/L   CO2 30 19 - 32 mEq/L   Glucose, Bld 92 70 - 99 mg/dL   BUN 13 6 - 23 mg/dL   Creatinine, Ser 1.00 0.40 - 1.20 mg/dL   Total Bilirubin 0.4 0.2 - 1.2 mg/dL   Alkaline Phosphatase 70 39 - 117 U/L   AST 17 0 - 37 U/L   ALT 10 0 - 35 U/L   Total Protein 6.5 6.0 - 8.3 g/dL   Albumin 4.1 3.5 - 5.2 g/dL   GFR 59.18 (L) >60.00 mL/min   Calcium 8.8 8.4 - 10.5 mg/dL  Lipid panel  Result Value Ref Range   Cholesterol 230 (H) 0 - 200 mg/dL   Triglycerides 151.0 (H) 0.0 - 149.0 mg/dL   HDL 47.20 >39.00 mg/dL   VLDL 30.2 0.0 - 40.0 mg/dL   LDL Cholesterol 152 (H) 0 - 99 mg/dL   Total CHOL/HDL Ratio 5    NonHDL 182.38     This visit occurred during the SARS-CoV-2 public health emergency.  Safety protocols were in place, including screening questions prior to the visit, additional usage of staff PPE, and extensive cleaning of exam room while observing appropriate contact time as indicated for disinfecting solutions.   COVID 19 screen:  No recent travel or known exposure to COVID19 The patient denies respiratory symptoms of COVID 19 at this time. The importance of social distancing was discussed today.   Assessment and Plan Problem List Items Addressed This Visit     Diarrhea - Primary   Relevant Orders   CBC with Differential/Platelet (Completed)   Comprehensive metabolic panel (Completed)   Lipase (Completed)   Hepatitis panel, acute (Completed)   RUQ pain   Relevant Orders   CBC with Differential/Platelet (Completed)   Comprehensive metabolic panel (Completed)   Lipase (Completed)   Hepatitis panel, acute (Completed)   Eval with labs. Increase fluid intake. Return precautions and ER precautions given.    Eliezer Lofts, MD

## 2021-04-20 NOTE — Patient Instructions (Addendum)
Please stop at the lab to have labs drawn. Push fluids, bland diet.

## 2021-04-23 LAB — HEPATITIS PANEL, ACUTE
Hep A IgM: NONREACTIVE
Hep B C IgM: NONREACTIVE
Hepatitis B Surface Ag: NONREACTIVE
Hepatitis C Ab: NONREACTIVE
SIGNAL TO CUT-OFF: 0.01 (ref <1.00)

## 2021-04-24 ENCOUNTER — Encounter: Payer: Self-pay | Admitting: Family Medicine

## 2021-04-26 NOTE — Telephone Encounter (Signed)
Please have her drop off urine sample for UA reflex to micro/culture

## 2021-04-27 ENCOUNTER — Other Ambulatory Visit (INDEPENDENT_AMBULATORY_CARE_PROVIDER_SITE_OTHER): Payer: Medicare Other

## 2021-04-27 DIAGNOSIS — R300 Dysuria: Secondary | ICD-10-CM | POA: Diagnosis not present

## 2021-04-27 DIAGNOSIS — M25562 Pain in left knee: Secondary | ICD-10-CM | POA: Diagnosis not present

## 2021-04-27 LAB — URINALYSIS, ROUTINE W REFLEX MICROSCOPIC
Bilirubin Urine: NEGATIVE
Ketones, ur: NEGATIVE
Nitrite: NEGATIVE
Specific Gravity, Urine: 1.01 (ref 1.000–1.030)
Urine Glucose: NEGATIVE
Urobilinogen, UA: 0.2 (ref 0.0–1.0)
pH: 6 (ref 5.0–8.0)

## 2021-04-27 MED ORDER — SULFAMETHOXAZOLE-TRIMETHOPRIM 800-160 MG PO TABS: 1.0000 | ORAL_TABLET | Freq: Two times a day (BID) | ORAL | 0 refills | Status: DC

## 2021-04-27 NOTE — Telephone Encounter (Signed)
Pt said she has frequency of urine, burning upon urination and voiding small amts. Pt wants to make sure a med will be sent in before the weekend and pt request cb after med sent to [facility]. Sending note to Dr Darnell Level and Lattie Haw CMA.. will also send teams to [facility].

## 2021-04-27 NOTE — Addendum Note (Signed)
Addended by: Ria Bush on: 04/27/2021 04:17 PM   Modules accepted: Orders

## 2021-04-27 NOTE — Telephone Encounter (Signed)
Replied via my chart message.

## 2021-04-29 LAB — URINE CULTURE
MICRO NUMBER:: 12266633
SPECIMEN QUALITY:: ADEQUATE

## 2021-05-03 ENCOUNTER — Encounter: Payer: Self-pay | Admitting: Family Medicine

## 2021-05-04 ENCOUNTER — Encounter: Payer: Self-pay | Admitting: Family Medicine

## 2021-05-04 ENCOUNTER — Other Ambulatory Visit: Payer: Self-pay

## 2021-05-04 ENCOUNTER — Ambulatory Visit (INDEPENDENT_AMBULATORY_CARE_PROVIDER_SITE_OTHER): Payer: Medicare Other | Admitting: Family Medicine

## 2021-05-04 ENCOUNTER — Telehealth: Payer: Self-pay | Admitting: *Deleted

## 2021-05-04 VITALS — BP 132/74 | HR 98 | Temp 97.3°F | Ht 62.75 in | Wt 184.6 lb

## 2021-05-04 DIAGNOSIS — R309 Painful micturition, unspecified: Secondary | ICD-10-CM | POA: Diagnosis not present

## 2021-05-04 DIAGNOSIS — N390 Urinary tract infection, site not specified: Secondary | ICD-10-CM | POA: Diagnosis not present

## 2021-05-04 LAB — POCT URINALYSIS DIP (MANUAL ENTRY)
Bilirubin, UA: NEGATIVE
Glucose, UA: NEGATIVE mg/dL
Ketones, POC UA: NEGATIVE mg/dL
Nitrite, UA: NEGATIVE
Spec Grav, UA: 1.01 (ref 1.010–1.025)
Urobilinogen, UA: 0.2 E.U./dL
pH, UA: 6 (ref 5.0–8.0)

## 2021-05-04 MED ORDER — SULFAMETHOXAZOLE-TRIMETHOPRIM 800-160 MG PO TABS: 1.0000 | ORAL_TABLET | Freq: Two times a day (BID) | ORAL | 0 refills | Status: DC

## 2021-05-04 NOTE — Telephone Encounter (Signed)
Seen today. 

## 2021-05-04 NOTE — Telephone Encounter (Signed)
Patient called and asked if she could come in today at 4pm for an evaluation patient stated she could. Please schedule for Dr. Darnell Level.

## 2021-05-04 NOTE — Patient Instructions (Signed)
Story consistent with recurrent UTI. Treat with another 5d bactrim course.  Push water and rest.  May use tylenol for discomfort May use cranberry tablets as needed.  Let us know if not improved with second bactrim course.   Urinary Tract Infection, Adult  A urinary tract infection (UTI) is an infection of any part of the urinary tract. The urinary tract includes the kidneys, ureters, bladder, and urethra.These organs make, store, and get rid of urine in the body. An upper UTI affects the ureters and kidneys. A lower UTI affects the bladderand urethra. What are the causes? Most urinary tract infections are caused by bacteria in your genital area around your urethra, where urine leaves your body. These bacteria grow andcause inflammation of your urinary tract. What increases the risk? You are more likely to develop this condition if: You have a urinary catheter that stays in place. You are not able to control when you urinate or have a bowel movement (incontinence). You are female and you: Use a spermicide or diaphragm for birth control. Have low estrogen levels. Are pregnant. You have certain genes that increase your risk. You are sexually active. You take antibiotic medicines. You have a condition that causes your flow of urine to slow down, such as: An enlarged prostate, if you are female. Blockage in your urethra. A kidney stone. A nerve condition that affects your bladder control (neurogenic bladder). Not getting enough to drink, or not urinating often. You have certain medical conditions, such as: Diabetes. A weak disease-fighting system (immunesystem). Sickle cell disease. Gout. Spinal cord injury. What are the signs or symptoms? Symptoms of this condition include: Needing to urinate right away (urgency). Frequent urination. This may include small amounts of urine each time you urinate. Pain or burning with urination. Blood in the urine. Urine that smells bad or  unusual. Trouble urinating. Cloudy urine. Vaginal discharge, if you are female. Pain in the abdomen or the lower back. You may also have: Vomiting or a decreased appetite. Confusion. Irritability or tiredness. A fever or chills. Diarrhea. The first symptom in older adults may be confusion. In some cases, they may nothave any symptoms until the infection has worsened. How is this diagnosed? This condition is diagnosed based on your medical history and a physical exam. You may also have other tests, including: Urine tests. Blood tests. Tests for STIs (sexually transmitted infections). If you have had more than one UTI, a cystoscopy or imaging studies may be doneto determine the cause of the infections. How is this treated? Treatment for this condition includes: Antibiotic medicine. Over-the-counter medicines to treat discomfort. Drinking enough water to stay hydrated. If you have frequent infections or have other conditions such as a kidney stone, you may need to see a health care provider who specializes in the urinary tract (urologist). In rare cases, urinary tract infections can cause sepsis. Sepsis is a life-threatening condition that occurs when the body responds to an infection. Sepsis is treated in the hospital with IV antibiotics, fluids, and othermedicines. Follow these instructions at home:  Medicines Take over-the-counter and prescription medicines only as told by your health care provider. If you were prescribed an antibiotic medicine, take it as told by your health care provider. Do not stop using the antibiotic even if you start to feel better. General instructions Make sure you: Empty your bladder often and completely. Do not hold urine for long periods of time. Empty your bladder after sex. Wipe from front to back after urinating or having  a bowel movement if you are female. Use each tissue only one time when you wipe. Drink enough fluid to keep your urine pale  yellow. Keep all follow-up visits. This is important. Contact a health care provider if: Your symptoms do not get better after 1-2 days. Your symptoms go away and then return. Get help right away if: You have severe pain in your back or your lower abdomen. You have a fever or chills. You have nausea or vomiting. Summary A urinary tract infection (UTI) is an infection of any part of the urinary tract, which includes the kidneys, ureters, bladder, and urethra. Most urinary tract infections are caused by bacteria in your genital area. Treatment for this condition often includes antibiotic medicines. If you were prescribed an antibiotic medicine, take it as told by your health care provider. Do not stop using the antibiotic even if you start to feel better. Keep all follow-up visits. This is important. This information is not intended to replace advice given to you by your health care provider. Make sure you discuss any questions you have with your healthcare provider. Document Revised: 04/07/2020 Document Reviewed: 04/07/2020 Elsevier Patient Education  [place].

## 2021-05-04 NOTE — Assessment & Plan Note (Addendum)
Story consistent with recurrent UTI despite initial improvement after 5d bactrim course.  UA again suspicious for infection - will send in another 5d course and if persistent symptoms, will change abx. Pt agrees with plan.

## 2021-05-04 NOTE — Telephone Encounter (Signed)
Please schedule OV at 4:00pm for evaluation.

## 2021-05-04 NOTE — Progress Notes (Signed)
Patient ID: Brittany [name], female    DOB: 1956/07/06, 65 y.o.   MRN: [id number]  This visit was conducted in person.  BP 132/74   Pulse 98   Temp (!) 97.3 F (36.3 C) (Temporal)   Ht 5' 2.75" (1.594 m)   Wt 184 lb 9.6 oz (83.7 kg)   SpO2 96%   BMI 32.96 kg/m    CC: recurrent UTI Subjective:   HPI: Brittany [name] is a 65 y.o. female presenting on 05/04/2021 for Urinary Tract Infection   UTI symptoms started last week - see MyChart message. UA abnormal, UCx with >100k pansensitive E coli.  Treated with bactrim DS 5d course with improvement in symptoms.  However 1d after finishing course (Wed this week), symptoms have returned to a lesser degree - urgency, frequency, dysuria.   No fevers/chills, nausea/vomiting, abdominal pain, flank pain or blood in urine.   Prior UTI was 2019.  Recent diarrheal GI illness - this has since resolved.  No h/o kidney stones.  No recent abx.      Relevant past medical, surgical, family and social history reviewed and updated as indicated. Interim medical history since our last visit reviewed. Allergies and medications reviewed and updated. Outpatient Medications Prior to Visit  Medication Sig Dispense Refill   Ascorbic Acid (VITAMIN C) 1000 MG tablet Take 1,000 mg by mouth daily.     atorvastatin (LIPITOR) 20 MG tablet TAKE 1 TABLET BY MOUTH EVERY DAY 90 tablet 1   Cholecalciferol (VITAMIN D3) 25 MCG (1000 UT) CAPS Take 1 capsule (1,000 Units total) by mouth daily. 30 capsule    gabapentin (NEURONTIN) 600 MG tablet 600 mg at bedtime.     ibuprofen (ADVIL,MOTRIN) 200 MG tablet Take 600 mg by mouth as needed.     levothyroxine (SYNTHROID) 100 MCG tablet Take 1 tablet (100 mcg total) by mouth daily before breakfast. 90 tablet 3   losartan (COZAAR) 50 MG tablet Take 1 tablet (50 mg total) by mouth daily. 90 tablet 3   Magnesium 200 MG TABS Take 1 tablet (200 mg total) by mouth daily.     Multiple Vitamins-Minerals (ALIVE WOMENS ENERGY PO) Take 1  tablet by mouth daily.     Omega-3 Fatty Acids (FISH OIL PO) Take by mouth daily. Takes 2,400 mg     omeprazole (PRILOSEC) 40 MG capsule Take 1 capsule (40 mg total) by mouth daily. 90 capsule 3   zinc gluconate 50 MG tablet Take 1 tablet (50 mg total) by mouth daily.     sulfamethoxazole-trimethoprim (BACTRIM DS) 800-160 MG tablet Take 1 tablet by mouth 2 (two) times daily. 10 tablet 0   No facility-administered medications prior to visit.     Per HPI unless specifically indicated in ROS section below Review of Systems  Objective:  BP 132/74   Pulse 98   Temp (!) 97.3 F (36.3 C) (Temporal)   Ht 5' 2.75" (1.594 m)   Wt 184 lb 9.6 oz (83.7 kg)   SpO2 96%   BMI 32.96 kg/m   Wt Readings from Last 3 Encounters:  05/04/21 184 lb 9.6 oz (83.7 kg)  04/20/21 179 lb 8 oz (81.4 kg)  12/14/20 182 lb (82.6 kg)      Physical Exam Vitals and nursing note reviewed.  Constitutional:      Appearance: Normal appearance. She is not ill-appearing.  Abdominal:     General: Bowel sounds are normal. There is no distension.     Palpations: Abdomen is soft. There  is no mass.     Tenderness: There is no abdominal tenderness. There is no right CVA tenderness, left CVA tenderness, guarding or rebound.     Hernia: No hernia is present.  Neurological:     Mental Status: She is alert.  Psychiatric:        Mood and Affect: Mood normal.        Behavior: Behavior normal.      Results for orders placed or performed in visit on 05/04/21  POCT urinalysis dipstick  Result Value Ref Range   Color, UA light yellow (A) yellow   Clarity, UA cloudy (A) clear   Glucose, UA negative negative mg/dL   Bilirubin, UA negative negative   Ketones, POC UA negative negative mg/dL   Spec Grav, UA 1.010 1.010 - 1.025   Blood, UA small (A) negative   pH, UA 6.0 5.0 - 8.0   Protein Ur, POC trace (A) negative mg/dL   Urobilinogen, UA 0.2 0.2 or 1.0 E.U./dL   Nitrite, UA Negative Negative   Leukocytes, UA Large (3+)  (A) Negative   Assessment & Plan:  This visit occurred during the SARS-CoV-2 public health emergency.  Safety protocols were in place, including screening questions prior to the visit, additional usage of staff PPE, and extensive cleaning of exam room while observing appropriate contact time as indicated for disinfecting solutions.   Problem List Items Addressed This Visit     Recurrent UTI - Primary    Story consistent with recurrent UTI despite initial improvement after 5d bactrim course.  UA again suspicious for infection - will send in another 5d course and if persistent symptoms, will change abx. Pt agrees with plan.       Relevant Medications   sulfamethoxazole-trimethoprim (BACTRIM DS) 800-160 MG tablet   Other Visit Diagnoses     Painful urination       Relevant Orders   POCT urinalysis dipstick (Completed)        Meds ordered this encounter  Medications   sulfamethoxazole-trimethoprim (BACTRIM DS) 800-160 MG tablet    Sig: Take 1 tablet by mouth 2 (two) times daily.    Dispense:  10 tablet    Refill:  0    Orders Placed This Encounter  Procedures   POCT urinalysis dipstick     Patient instructions: Story consistent with recurrent UTI. Treat with another 5d bactrim course.  Push water and rest.  May use tylenol for discomfort May use cranberry tablets as needed.  Let us know if not improved with second bactrim course.   Follow up plan: Return if symptoms worsen or fail to improve.  Ria Bush, MD

## 2021-05-04 NOTE — Telephone Encounter (Signed)
Pt called triage to f/u on mychart she sent PCP. She hasn't received a response and since it's Friday she wanted to call also. Pt said she was recently diagnosed with a UTI and given 5 days of abx. Pt said she finished the abx on Wednesday and was feeling better but by Thursday her sxs started to return and now they are back. Pt said she has urgency, frequency and a little dysuria but no fever/chills. Pt said the sxs are not as sever as before but she is afraid that untreated they will keep getting worse. Pt asked if PCP would send another round of abx to her pharmacy [facility] [address]

## 2021-05-08 ENCOUNTER — Encounter: Payer: Self-pay | Admitting: Family Medicine

## 2021-05-09 MED ORDER — FOSFOMYCIN TROMETHAMINE 3 G PO PACK: 3.0000 g | PACK | Freq: Once | ORAL | 0 refills | Status: AC

## 2021-05-21 ENCOUNTER — Other Ambulatory Visit: Payer: Self-pay

## 2021-05-21 ENCOUNTER — Ambulatory Visit: Payer: Medicare Other | Admitting: Urology

## 2021-05-21 ENCOUNTER — Encounter: Payer: Self-pay | Admitting: Urology

## 2021-05-21 ENCOUNTER — Ambulatory Visit: Payer: Self-pay | Admitting: Urology

## 2021-05-21 VITALS — BP 150/82 | HR 84 | Ht 64.0 in | Wt 180.0 lb

## 2021-05-21 DIAGNOSIS — N390 Urinary tract infection, site not specified: Secondary | ICD-10-CM | POA: Diagnosis not present

## 2021-05-21 LAB — BLADDER SCAN AMB NON-IMAGING

## 2021-05-21 MED ORDER — DOXYCYCLINE HYCLATE 100 MG PO CAPS: 100.0000 mg | ORAL_CAPSULE | Freq: Two times a day (BID) | ORAL | 0 refills | Status: DC

## 2021-05-21 NOTE — Progress Notes (Signed)
05/21/21 1:00 PM   Brittany [name] 1956/01/09 [id number]  CC: Recurrent UTI, right flank pain  HPI: 65 year old female who was diagnosed with an E. coli UTI on 04/27/2021, treated with 5 days of Bactrim.  She had symptoms of foul-smelling urine, dysuria, and urgency/frequency.  She had improvement of her symptoms for 1 day, then return of UTI type symptoms, and urine culture was reportedly again positive.  She was treated with an additional 5-day course of Bactrim, and had a similar recurrence, and ultimately was treated with fosfomycin about a week ago.  Her dysuria has improved, but she still reports an abnormal sensation with urination and in the bladder.  She feels like she still has a UTI.  She has had some intermittent right-sided abdominal pain, but denies any fevers or chills.  She denies any gross hematuria, no smoking history.  Her last infection prior to this was 2019 that was associated with sexual activity.  Surgical history notable for distant history of recurrent diverticulitis status post hemicolectomy about 20 years ago, as well as open hysterectomy.  Urinalysis today concerning for infection with greater than 30 WBCs, 0-2 RBCs, moderate bacteria, no yeast, nitrite negative, 2+ leukocytes.  PVR mildly elevated at 187 mL.   PMH: Past Medical History:  Diagnosis Date   Allergy    Arthritis    neck pain and numbness left digits Vertell Limber), lower spine and knees   Chronic lower back pain    persistent after L1 cmp fx, with multilevel mild lumbar DDD - good resolution after L L5/S1 ESI ([place])   Colon polyps    last colonoscopy 2006? rpt due   Diverticulitis    Fatty liver 02/2012   on CT scan 02/2012   GERD (gastroesophageal reflux disease)    History of diverticulitis of colon    s/p resection of large intestine, rpt itis 02/2012   HLD (hyperlipidemia)    HTN (hypertension)    Hypothyroidism    Migraines    occasional, stress related   OSA (obstructive sleep apnea)  09/2016   mild to moderate, consider CPAP   Postmenopausal    HRT compound, previously premarin   Seasonal allergies    Vertebral compression fracture ([place]) 07/2012   L1 25% Vertell Limber) released without intervention    Surgical History: Past Surgical History:  Procedure Laterality Date   ANTERIOR FUSION CERVICAL SPINE  01/1999   cervical HNP   COLON RESECTION  2008   2 ft removed, diverticulitis   COLONOSCOPY  03/2012   partial colectomy, scattered diverticulae [name], rpt 10 yrs   ESI Left 06/2013, 12/2013, 09/2014   L5/S1 with good resolution of pain   ESOPHAGOGASTRODUODENOSCOPY  03/2012   mod gastritis, benign polyps, H pylori neg   FOOT SURGERY Left 01/2014   big toe fusion   [place], 2011   torn menisci (MRI 2010 L)   SPIROMETRY  2011   normal   TONSILLECTOMY  1964   TOTAL ABDOMINAL HYSTERECTOMY W/ BILATERAL SALPINGOOPHORECTOMY  06/2009   vag bleeding   UPPER GASTROINTESTINAL ENDOSCOPY     US ECHOCARDIOGRAPHY  08/2010   normal, EF 63%, mild valvular issues      Family History: Family History  Problem Relation Age of Onset   Coronary artery disease Mother 75       CABG and valve replacement   Arthritis Mother    Valvular heart disease Mother        Had valve replacement   Coronary artery disease  Father 45       CAD/MI   Hypertension Father    Heart disease Father    Diabetes Sister    Hypertension Sister    Hypertension Brother    Coronary artery disease Brother        Stent   Coronary artery disease Brother        Stents   Hypertension Brother    Hypertension Sister    Mitral valve prolapse Sister    Hypertension Sister    Thyroid disease Sister    Colon polyps Sister    Irritable bowel syndrome Sister    Diabetes Sister    Diabetes Maternal Grandfather    Thyroid disease Son    Cancer Neg Hx    Colon cancer Neg Hx    Esophageal cancer Neg Hx    Inflammatory bowel disease Neg Hx    Liver disease Neg Hx    Pancreatic cancer Neg Hx     Rectal cancer Neg Hx    Stomach cancer Neg Hx     Social History:  reports that she has never smoked. She has never used smokeless tobacco. She reports current alcohol use. She reports that she does not use drugs.  Physical Exam: BP (!) 150/82   Pulse 84   Ht '5\' 4"'$  (1.626 m)   Wt 180 lb (81.6 kg)   BMI 30.90 kg/m    Constitutional:  Alert and oriented, No acute distress. Cardiovascular: No clubbing, cyanosis, or edema. Respiratory: Normal respiratory effort, no increased work of breathing. GI: Abdomen is soft, nontender, nondistended, no abdominal masses  Laboratory Data: Reviewed, see HPI  Pertinent Imaging: None to review  Assessment & Plan:   65 year old female with E. coli culture documented UTI on 04/27/2021 with persistent symptoms and infected urine despite 2 rounds of Bactrim DS twice daily x5 days, and fosfomycin.  We discussed the evaluation and treatment of patients with recurrent UTIs at length.  We specifically discussed the differences between asymptomatic bacteriuria and true urinary tract infection.  We discussed the AUA definition of recurrent UTI of at least 2 culture proven symptomatic acute cystitis episodes in a 76-monthperiod, or 3 within a 1 year period.  We discussed the importance of culture directed antibiotic treatment, and antibiotic stewardship.  First-line therapy includes nitrofurantoin(5 days), Bactrim(3 days), or fosfomycin(3 g single dose).  Possible etiologies of recurrent infection include periurethral tissue atrophy in postmenopausal woman, constipation, sexual activity, incomplete emptying, anatomic abnormalities, and even genetic predisposition.  Finally, we discussed the role of perineal hygiene, timed voiding, adequate hydration, topical vaginal estrogen, cranberry prophylaxis, and low-dose antibiotic prophylaxis.  We also discussed more rare causes like colovesical fistula, stones, or malignancy.  Doxycycline 100 mg twice daily x7 days Urine  sent for culture and atypicals Renal ultrasound and cystoscopy for further evaluation of persistent UTI  BNickolas Madrid MD 05/21/2021  [id number] [place] [phone]

## 2021-05-21 NOTE — Patient Instructions (Signed)
Cystoscopy Cystoscopy is a procedure that is used to help diagnose and sometimes treat conditions that affect the lower urinary tract. The lower urinary tract includes the bladder and the urethra. The urethra is the tube that drains urine from the bladder. Cystoscopy is done using a thin, tube-shaped instrument with a light and camera at the end (cystoscope). The cystoscope may be hard or flexible, depending on the goal of the procedure. The cystoscope is inserted through the urethra, into the bladder. Cystoscopy may be recommended if you have: Urinary tract infections that keep coming back. Blood in the urine (hematuria). An inability to control when you urinate (urinary incontinence) or an overactive bladder. Unusual cells found in a urine sample. A blockage in the urethra, such as a urinary stone. Painful urination. An abnormality in the bladder found during an intravenous pyelogram (IVP) or CT scan. Cystoscopy may also be done to remove a sample of tissue to be examined under a microscope (biopsy). What are the risks? Generally, this is a safe procedure. However, problems may occur, including: Infection. Bleeding.  What happens during the procedure?  You will be given one or more of the following: A medicine to numb the area (local anesthetic). The area around the opening of your urethra will be cleaned. The cystoscope will be passed through your urethra into your bladder. Germ-free (sterile) fluid will flow through the cystoscope to fill your bladder. The fluid will stretch your bladder so that your health care provider can clearly examine your bladder walls. Your doctor will look at the urethra and bladder. The cystoscope will be removed The procedure may vary among health care providers  What can I expect after the procedure? After the procedure, it is common to have: Some soreness or pain in your abdomen and urethra. Urinary symptoms. These include: Mild pain or burning when you  urinate. Pain should stop within a few minutes after you urinate. This may last for up to 1 week. A small amount of blood in your urine for several days. Feeling like you need to urinate but producing only a small amount of urine. Follow these instructions at home: General instructions Return to your normal activities as told by your health care provider.  Do not drive for 24 hours if you were given a sedative during your procedure. Watch for any blood in your urine. If the amount of blood in your urine increases, call your health care provider. If a tissue sample was removed for testing (biopsy) during your procedure, it is up to you to get your test results. Ask your health care provider, or the department that is doing the test, when your results will be ready. Drink enough fluid to keep your urine pale yellow. Keep all follow-up visits as told by your health care provider. This is important. Contact a health care provider if you: Have pain that gets worse or does not get better with medicine, especially pain when you urinate. Have trouble urinating. Have more blood in your urine. Get help right away if you: Have blood clots in your urine. Have abdominal pain. Have a fever or chills. Are unable to urinate. Summary Cystoscopy is a procedure that is used to help diagnose and sometimes treat conditions that affect the lower urinary tract. Cystoscopy is done using a thin, tube-shaped instrument with a light and camera at the end. After the procedure, it is common to have some soreness or pain in your abdomen and urethra. Watch for any blood in your urine.   If the amount of blood in your urine increases, call your health care provider. If you were prescribed an antibiotic medicine, take it as told by your health care provider. Do not stop taking the antibiotic even if you start to feel better. This information is not intended to replace advice given to you by your health care provider. Make  sure you discuss any questions you have with your health care provider. Document Revised: 08/18/2018 Document Reviewed: 08/18/2018 Elsevier Patient Education  2020 Elsevier Inc.  

## 2021-05-23 LAB — URINALYSIS, COMPLETE
Bilirubin, UA: NEGATIVE
Glucose, UA: NEGATIVE
Ketones, UA: NEGATIVE
Nitrite, UA: NEGATIVE
Protein,UA: NEGATIVE
Specific Gravity, UA: <1.005 — ABNORMAL LOW (ref 1.005–1.030)
Urobilinogen, Ur: 0.2 mg/dL (ref 0.2–1.0)
pH, UA: 6 (ref 5.0–7.5)

## 2021-05-23 LAB — MICROSCOPIC EXAMINATION: WBC, UA: >30 /hpf — AB (ref 0–5)

## 2021-05-29 LAB — CULTURE, URINE COMPREHENSIVE

## 2021-05-31 LAB — MYCOPLASMA / UREAPLASMA CULTURE

## 2021-06-05 ENCOUNTER — Ambulatory Visit
Admission: RE | Admit: 2021-06-05 | Discharge: 2021-06-05 | Disposition: A | Discharge status: Home / Self Care | Payer: Medicare Other | Source: Ambulatory Visit | Attending: Urology | Admitting: Urology

## 2021-06-05 ENCOUNTER — Other Ambulatory Visit: Payer: Self-pay

## 2021-06-05 DIAGNOSIS — N390 Urinary tract infection, site not specified: Secondary | ICD-10-CM | POA: Diagnosis not present

## 2021-06-07 ENCOUNTER — Telehealth: Payer: Self-pay

## 2021-06-07 NOTE — Telephone Encounter (Signed)
-----   Message from Billey Co, MD sent at 06/07/2021 10:15 AM EDT ----- Renal/bladder US normal, keep scheduled follow up  Nickolas Madrid, MD 06/07/2021

## 2021-06-21 ENCOUNTER — Ambulatory Visit: Payer: Medicare Other | Admitting: Urology

## 2021-06-21 ENCOUNTER — Other Ambulatory Visit: Payer: Self-pay

## 2021-06-21 VITALS — BP 155/79 | HR 98 | Ht 63.0 in | Wt 178.0 lb

## 2021-06-21 DIAGNOSIS — N390 Urinary tract infection, site not specified: Secondary | ICD-10-CM

## 2021-06-21 DIAGNOSIS — R1011 Right upper quadrant pain: Secondary | ICD-10-CM

## 2021-06-21 MED ORDER — ESTRADIOL 0.1 MG/GM VA CREA: TOPICAL_CREAM | VAGINAL | 12 refills | Status: DC

## 2021-06-21 MED ORDER — AMOXICILLIN-POT CLAVULANATE 875-125 MG PO TABS: 1.0000 | ORAL_TABLET | Freq: Two times a day (BID) | ORAL | 0 refills | Status: DC

## 2021-06-21 MED ORDER — NITROFURANTOIN MACROCRYSTAL 50 MG PO CAPS: 50.0000 mg | ORAL_CAPSULE | Freq: Every day | ORAL | 0 refills | Status: DC

## 2021-06-21 NOTE — Progress Notes (Signed)
Cystoscopy Procedure Note:  Indication: Recurrent UTI  After informed consent and discussion of the procedure and its risks, Brittany [name] was positioned and prepped in the standard fashion. Cystoscopy was performed with a flexible cystoscope. The urethra, bladder neck and entire bladder was visualized in a standard fashion. The ureteral orifices were visualized in their normal location and orientation.  Bladder mucosa grossly normal throughout, no abnormalities on retroflexion  Imaging: Renal ultrasound normal  Findings: Normal cystoscopy  -----------------------------------------------------------------------------  Assessment and Plan: She continues to have foul-smelling urine and UTI type symptoms despite culture appropriate antibiotic treatment for multiple UTIs over the last few months.  Renal/bladder ultrasound and cystoscopy normal.  She has an extensive history of diverticulitis and bowel resection, and I recommended a CT abdomen pelvis for further evaluation, will call with those results.  Regarding her recurrent UTIs, I recommended sending urine today for culture, 7 days Augmentin twice daily followed by nitrofurantoin 50 mg daily prophylaxis for 3 months, as well as starting topical estrogen cream.  Call with CT results  RTC 3 months symptom check    Nickolas Madrid, MD 06/21/2021

## 2021-06-21 NOTE — Patient Instructions (Signed)
Estrogen Cream Instruction  Discard applicator  Apply pea sized amount to tip of finger to urethra before bed. Wash hands well after application. Use Monday, Wednesday and Friday

## 2021-07-02 LAB — CULTURE, URINE COMPREHENSIVE

## 2021-07-18 ENCOUNTER — Other Ambulatory Visit: Payer: Self-pay | Admitting: Family Medicine

## 2021-07-19 ENCOUNTER — Other Ambulatory Visit: Payer: Self-pay | Admitting: Family Medicine

## 2021-07-19 DIAGNOSIS — E785 Hyperlipidemia, unspecified: Secondary | ICD-10-CM

## 2021-07-31 ENCOUNTER — Ambulatory Visit
Admission: RE | Admit: 2021-07-31 | Discharge: 2021-07-31 | Disposition: A | Discharge status: Home / Self Care | Payer: Medicare Other | Source: Ambulatory Visit | Attending: Urology | Admitting: Urology

## 2021-07-31 ENCOUNTER — Other Ambulatory Visit: Payer: Self-pay | Admitting: Family Medicine

## 2021-07-31 ENCOUNTER — Other Ambulatory Visit: Payer: Self-pay

## 2021-07-31 DIAGNOSIS — R1011 Right upper quadrant pain: Secondary | ICD-10-CM | POA: Insufficient documentation

## 2021-07-31 DIAGNOSIS — N390 Urinary tract infection, site not specified: Secondary | ICD-10-CM | POA: Diagnosis not present

## 2021-07-31 DIAGNOSIS — K6389 Other specified diseases of intestine: Secondary | ICD-10-CM | POA: Diagnosis not present

## 2021-07-31 DIAGNOSIS — K5730 Diverticulosis of large intestine without perforation or abscess without bleeding: Secondary | ICD-10-CM | POA: Diagnosis not present

## 2021-07-31 LAB — POCT I-STAT CREATININE: Creatinine, Ser: 0.9 mg/dL (ref 0.44–1.00)

## 2021-07-31 MED ORDER — IOHEXOL 300 MG/ML  SOLN
100.0000 mL | Freq: Once | INTRAMUSCULAR | Status: AC | PRN
  Administered 2021-07-31: 100 mL via INTRAVENOUS

## 2021-08-09 DIAGNOSIS — M1712 Unilateral primary osteoarthritis, left knee: Secondary | ICD-10-CM | POA: Diagnosis not present

## 2021-08-11 ENCOUNTER — Other Ambulatory Visit: Payer: Self-pay | Admitting: Family Medicine

## 2021-09-10 ENCOUNTER — Other Ambulatory Visit: Payer: Self-pay | Admitting: Family Medicine

## 2021-09-10 DIAGNOSIS — E785 Hyperlipidemia, unspecified: Secondary | ICD-10-CM

## 2021-09-10 DIAGNOSIS — E538 Deficiency of other specified B group vitamins: Secondary | ICD-10-CM

## 2021-09-10 DIAGNOSIS — E039 Hypothyroidism, unspecified: Secondary | ICD-10-CM

## 2021-09-11 ENCOUNTER — Other Ambulatory Visit: Payer: Self-pay

## 2021-09-11 ENCOUNTER — Other Ambulatory Visit (INDEPENDENT_AMBULATORY_CARE_PROVIDER_SITE_OTHER): Payer: Medicare Other

## 2021-09-11 DIAGNOSIS — E039 Hypothyroidism, unspecified: Secondary | ICD-10-CM | POA: Diagnosis not present

## 2021-09-11 DIAGNOSIS — E785 Hyperlipidemia, unspecified: Secondary | ICD-10-CM

## 2021-09-11 DIAGNOSIS — E538 Deficiency of other specified B group vitamins: Secondary | ICD-10-CM | POA: Diagnosis not present

## 2021-09-11 LAB — LIPID PANEL
Cholesterol: 166 mg/dL (ref 0–200)
HDL: 53.6 mg/dL (ref >39.00)
LDL Cholesterol: 89 mg/dL (ref 0–99)
NonHDL: 112.55
Total CHOL/HDL Ratio: 3
Triglycerides: 119 mg/dL (ref 0.0–149.0)
VLDL: 23.8 mg/dL (ref 0.0–40.0)

## 2021-09-11 LAB — COMPREHENSIVE METABOLIC PANEL
ALT: 11 U/L (ref 0–35)
AST: 17 U/L (ref 0–37)
Albumin: 4.2 g/dL (ref 3.5–5.2)
Alkaline Phosphatase: 73 U/L (ref 39–117)
BUN: 11 mg/dL (ref 6–23)
CO2: 28 mEq/L (ref 19–32)
Calcium: 9 mg/dL (ref 8.4–10.5)
Chloride: 102 mEq/L (ref 96–112)
Creatinine, Ser: 0.89 mg/dL (ref 0.40–1.20)
GFR: 67.51 mL/min (ref >60.00)
Glucose, Bld: 90 mg/dL (ref 70–99)
Potassium: 4.1 mEq/L (ref 3.5–5.1)
Sodium: 138 mEq/L (ref 135–145)
Total Bilirubin: 0.7 mg/dL (ref 0.2–1.2)
Total Protein: 6.8 g/dL (ref 6.0–8.3)

## 2021-09-11 LAB — TSH: TSH: 0.42 u[IU]/mL (ref 0.35–5.50)

## 2021-09-11 LAB — VITAMIN B12: Vitamin B-12: 211 pg/mL (ref 211–911)

## 2021-09-18 ENCOUNTER — Ambulatory Visit (INDEPENDENT_AMBULATORY_CARE_PROVIDER_SITE_OTHER): Payer: Medicare Other | Admitting: Family Medicine

## 2021-09-18 ENCOUNTER — Encounter: Payer: Self-pay | Admitting: Family Medicine

## 2021-09-18 ENCOUNTER — Other Ambulatory Visit: Payer: Self-pay

## 2021-09-18 VITALS — BP 130/76 | HR 75 | Temp 97.5°F | Ht 63.0 in | Wt 179.4 lb

## 2021-09-18 DIAGNOSIS — Z7189 Other specified counseling: Secondary | ICD-10-CM

## 2021-09-18 DIAGNOSIS — E039 Hypothyroidism, unspecified: Secondary | ICD-10-CM

## 2021-09-18 DIAGNOSIS — E538 Deficiency of other specified B group vitamins: Secondary | ICD-10-CM

## 2021-09-18 DIAGNOSIS — I10 Essential (primary) hypertension: Secondary | ICD-10-CM

## 2021-09-18 DIAGNOSIS — E669 Obesity, unspecified: Secondary | ICD-10-CM

## 2021-09-18 DIAGNOSIS — K219 Gastro-esophageal reflux disease without esophagitis: Secondary | ICD-10-CM

## 2021-09-18 DIAGNOSIS — R7989 Other specified abnormal findings of blood chemistry: Secondary | ICD-10-CM

## 2021-09-18 DIAGNOSIS — E785 Hyperlipidemia, unspecified: Secondary | ICD-10-CM

## 2021-09-18 DIAGNOSIS — R232 Flushing: Secondary | ICD-10-CM

## 2021-09-18 DIAGNOSIS — Z0000 Encounter for general adult medical examination without abnormal findings: Secondary | ICD-10-CM | POA: Diagnosis not present

## 2021-09-18 DIAGNOSIS — N390 Urinary tract infection, site not specified: Secondary | ICD-10-CM

## 2021-09-18 DIAGNOSIS — E66811 Obesity, class 1: Secondary | ICD-10-CM

## 2021-09-18 MED ORDER — VENLAFAXINE HCL ER 37.5 MG PO CP24: 37.5000 mg | ORAL_CAPSULE | Freq: Every day | ORAL | 6 refills | Status: DC

## 2021-09-18 MED ORDER — CYANOCOBALAMIN 500 MCG PO TABS: 500.0000 ug | ORAL_TABLET | ORAL | Status: DC

## 2021-09-18 NOTE — Assessment & Plan Note (Signed)
Stable period on low dose nitrofurantoin through urology.

## 2021-09-18 NOTE — Assessment & Plan Note (Signed)
Gabapentin nightly has not helped.  She would like to try effexor as her sister has had good benefit with this - will start 37.5mg  XR once daily, discussed possible difficulty tapering off medication once started. Update with effect.

## 2021-09-18 NOTE — Patient Instructions (Addendum)
If interested, check with pharmacy about new 2 shot shingles series (shingrix).  Get pneumonia shot as well (Prevnar-20).  Bring [place] a copy of your advanced directive.  Start vitamin b12 525mcg MWF (3 days a well).  Try effexor low dose to help hot flashes, let me know how this helps.  You are doing well today Return as needed or in 1 year for next physical/wellness visit.   Health Maintenance After Age 67 After age 7, you are at a higher risk for certain long-term diseases and infections as well as injuries from falls. Falls are a major cause of broken bones and head injuries in people who are older than age 67. Getting regular preventive care can help to keep you healthy and well. Preventive care includes getting regular testing and making lifestyle changes as recommended by your health care provider. Talk with your health care provider about: Which screenings and tests you should have. A screening is a test that checks for a disease when you have no symptoms. A diet and exercise plan that is right for you. What should I know about screenings and tests to prevent falls? Screening and testing are the best ways to find a health problem early. Early diagnosis and treatment give you the best chance of managing medical conditions that are common after age 85. Certain conditions and lifestyle choices may make you more likely to have a fall. Your health care provider may recommend: Regular vision checks. Poor vision and conditions such as cataracts can make you more likely to have a fall. If you wear glasses, make sure to get your prescription updated if your vision changes. Medicine review. Work with your health care provider to regularly review all of the medicines you are taking, including over-the-counter medicines. Ask your health care provider about any side effects that may make you more likely to have a fall. Tell your health care provider if any medicines that you take make you feel dizzy or  sleepy. Strength and balance checks. Your health care provider may recommend certain tests to check your strength and balance while standing, walking, or changing positions. Foot health exam. Foot pain and numbness, as well as not wearing proper footwear, can make you more likely to have a fall. Screenings, including: Osteoporosis screening. Osteoporosis is a condition that causes the bones to get weaker and break more easily. Blood pressure screening. Blood pressure changes and medicines to control blood pressure can make you feel dizzy. Depression screening. You may be more likely to have a fall if you have a fear of falling, feel depressed, or feel unable to do activities that you used to do. Alcohol use screening. Using too much alcohol can affect your balance and may make you more likely to have a fall. Follow these instructions at home: Lifestyle Do not drink alcohol if: Your health care provider tells you not to drink. If you drink alcohol: Limit how much you have to: 0-1 drink a day for women. 0-2 drinks a day for men. Know how much alcohol is in your drink. In the [place] one drink equals one 12 oz bottle of beer (355 mL), one 5 oz glass of wine (148 mL), or one 1 oz glass of hard liquor (44 mL). Do not use any products that contain nicotine or tobacco. These products include cigarettes, chewing tobacco, and vaping devices, such as e-cigarettes. If you need help quitting, ask your health care provider. Activity  Follow a regular exercise program to stay fit. This  will help you maintain your balance. Ask your health care provider what types of exercise are appropriate for you. If you need a cane or walker, use it as recommended by your health care provider. Wear supportive shoes that have nonskid soles. Safety  Remove any tripping hazards, such as rugs, cords, and clutter. Install safety equipment such as grab bars in bathrooms and safety rails on stairs. Keep rooms and walkways  well-lit. General instructions Talk with your health care provider about your risks for falling. Tell your health care provider if: You fall. Be sure to tell your health care provider about all falls, even ones that seem minor. You feel dizzy, tiredness (fatigue), or off-balance. Take over-the-counter and prescription medicines only as told by your health care provider. These include supplements. Eat a healthy diet and maintain a healthy weight. A healthy diet includes low-fat dairy products, low-fat (lean) meats, and fiber from whole grains, beans, and lots of fruits and vegetables. Stay current with your vaccines. Schedule regular health, dental, and eye exams. Summary Having a healthy lifestyle and getting preventive care can help to protect your health and wellness after age 49. Screening and testing are the best way to find a health problem early and help you avoid having a fall. Early diagnosis and treatment give you the best chance for managing medical conditions that are more common for people who are older than age 49. Falls are a major cause of broken bones and head injuries in people who are older than age 61. Take precautions to prevent a fall at home. Work with your health care provider to learn what changes you can make to improve your health and wellness and to prevent falls. This information is not intended to replace advice given to you by your health care provider. Make sure you discuss any questions you have with your health care provider. Document Revised: 01/15/2021 Document Reviewed: 01/15/2021 Elsevier Patient Education  [employer].

## 2021-09-18 NOTE — Assessment & Plan Note (Signed)
Continue to encourage healthy diet and lifestyle choices to affect sustainable weight loss.  °

## 2021-09-18 NOTE — Assessment & Plan Note (Signed)

## 2021-09-18 NOTE — Assessment & Plan Note (Signed)
Advanced directives: completed at home but needs to get notarized. Thinks would want sister Brittany [name] to be HCPOA. Asked to bring [place] a copy.

## 2021-09-18 NOTE — Assessment & Plan Note (Signed)
Continues omeprazole 40mg daily.  

## 2021-09-18 NOTE — Assessment & Plan Note (Signed)
Chronic, stable on losartan 50mg  daily.

## 2021-09-18 NOTE — Assessment & Plan Note (Addendum)
Chronic, improved on atorvastatin 20mg  - continue. The 10-year ASCVD risk score (Arnett DK, et al., 2019) is: 7.9%   Values used to calculate the score:     Age: 66 years     Sex: Female     Is Non-Hispanic African American: No     Diabetic: No     Tobacco smoker: No     Systolic Blood Pressure: 536 mmHg     Is BP treated: Yes     HDL Cholesterol: 53.6 mg/dL     Total Cholesterol: 166 mg/dL

## 2021-09-18 NOTE — Assessment & Plan Note (Signed)
Chronic, stable on levothyroxine - continue.

## 2021-09-18 NOTE — Progress Notes (Signed)
Patient ID: Brittany [name], female    DOB: 21-Jul-1956, 66 y.o.   MRN: [id number]  This visit was conducted in person.  BP 130/76    Pulse 75    Temp (!) 97.5 F (36.4 C) (Temporal)    Ht 5\' 3"  (1.6 m)    Wt 179 lb 7 oz (81.4 kg)    SpO2 98%    BMI 31.79 kg/m    CC: CPE/AMW Subjective:   HPI: Brittany [name] is a 66 y.o. female presenting on 09/18/2021 for Medicare Wellness   Did not see health advisor this year.   Hearing Screening   500Hz  1000Hz  2000Hz  4000Hz   Right ear 20 20 20 20   Left ear 20 20 20 20   Vision Screening - Comments:: Last eye exam, 2022.  [place] Office Visit from 09/18/2021 in [place] [facility]  PHQ-2 Total Score 0       Fall Risk  09/18/2021 06/13/2020 03/04/2019 02/12/2018 02/07/2017  Falls in the past year? 0 0 0 No No  Number falls in past yr: - 0 - - -  Injury with Fall? - 0 - - -  Risk for fall due to : - Medication side effect - - -  Follow up - Falls evaluation completed;Falls prevention discussed - - -   Ongoing hot flashes - med tried last year didn't help. Gabapentin doesn't help. Interested in trying effexor which sister has had good effect with.   Preventative: COLONOSCOPY Date: 03/2012 partial colectomy (h/o diverticulitis), scattered diverticulae [name], rpt 10 yrs  Colonoscopy 12/2020 - diverticulosis, hem, rpt 10 yrs (Mansouraty) Well woman - s/p hysterectomy (benign reason). Doesn't need rpt pap. Ovaries removed (2010).  Mammogram 01/2021 [facility] at [facility]  Lung cancer screening - not eligible Flu shot - yearly [place] 11/2019, 12/2019, booster 06/2020, 12/2020, bivalent 05/2021 Tdap - 09/2011 Prevnar-20 discussed.  zostavax - 06/2016 shingrix - discussed. To check at pharmacy Advanced directives: completed at home but needs to get notarized. Thinks would want sister [name] to be HCPOA. Asked to bring [place] a copy.  Seat belt use discussed Sunscreen use discussed. No changing moles on skin.   Non smoker Alcohol - occasional beer on weekends  Dentist - yearly Eye exam yearly Bowel - no constipation  Bladder - rare urge incontinence   Caffeine: 2-3 cups/day (1-2 coffee)  Lives with husband and 16yo son, 4 dogs  Activity: yardwork, limited by back pain Diet: good water, fruits/vegetable daily      Relevant past medical, surgical, family and social history reviewed and updated as indicated. Interim medical history since our last visit reviewed. Allergies and medications reviewed and updated. Outpatient Medications Prior to Visit  Medication Sig Dispense Refill   Ascorbic Acid (VITAMIN C) 1000 MG tablet Take 1,000 mg by mouth daily.     atorvastatin (LIPITOR) 20 MG tablet TAKE 1 TABLET BY MOUTH EVERY DAY 90 tablet 0   Cholecalciferol (VITAMIN D3) 25 MCG (1000 UT) CAPS Take 1 capsule (1,000 Units total) by mouth daily. 30 capsule    Cranberry 500 MG CAPS Take by mouth.     estradiol (ESTRACE) 0.1 MG/GM vaginal cream Estrogen Cream Instruction Discard applicator Apply pea sized amount to tip of finger to urethra before bed. Wash hands well after application. Use Monday, Wednesday and Friday 42.5 g 12   gabapentin (NEURONTIN) 600 MG tablet 600 mg at bedtime.     ibuprofen (ADVIL,MOTRIN) 200 MG tablet Take 600 mg by  mouth as needed.     levothyroxine (SYNTHROID) 100 MCG tablet TAKE 1 TABLET BY MOUTH  DAILY BEFORE BREAKFAST 90 tablet 0   losartan (COZAAR) 50 MG tablet TAKE 1 TABLET BY MOUTH  DAILY 90 tablet 0   Magnesium 200 MG TABS Take 1 tablet (200 mg total) by mouth daily.     Multiple Vitamins-Minerals (ALIVE WOMENS ENERGY PO) Take 1 tablet by mouth daily.     nitrofurantoin (MACRODANTIN) 50 MG capsule Take 1 capsule (50 mg total) by mouth daily. 90 capsule 0   Omega-3 Fatty Acids (FISH OIL PO) Take by mouth daily. Takes 2,400 mg     omeprazole (PRILOSEC) 40 MG capsule TAKE 1 CAPSULE BY MOUTH  DAILY 90 capsule 0   zinc gluconate 50 MG tablet Take 1 tablet (50 mg total) by  mouth daily.     amoxicillin-clavulanate (AUGMENTIN) 875-125 MG tablet Take 1 tablet by mouth every 12 (twelve) hours. 14 tablet 0   No facility-administered medications prior to visit.     Per HPI unless specifically indicated in ROS section below Review of Systems  Constitutional:  Negative for activity change, appetite change, chills, fatigue, fever and unexpected weight change.  HENT:  Negative for hearing loss.   Eyes:  Negative for visual disturbance.  Respiratory:  Negative for cough, chest tightness, shortness of breath and wheezing.   Cardiovascular:  Negative for chest pain, palpitations and leg swelling.  Gastrointestinal:  Negative for abdominal distention, abdominal pain, blood in stool, constipation, diarrhea, nausea and vomiting.  Genitourinary:  Negative for difficulty urinating and hematuria.  Musculoskeletal:  Negative for arthralgias, myalgias and neck pain.  Skin:  Negative for rash.  Neurological:  Negative for dizziness, seizures, syncope and headaches.  Hematological:  Negative for adenopathy. Does not bruise/bleed easily.  Psychiatric/Behavioral:  Negative for dysphoric mood. The patient is not nervous/anxious.    Objective:  BP 130/76    Pulse 75    Temp (!) 97.5 F (36.4 C) (Temporal)    Ht 5\' 3"  (1.6 m)    Wt 179 lb 7 oz (81.4 kg)    SpO2 98%    BMI 31.79 kg/m   Wt Readings from Last 3 Encounters:  09/18/21 179 lb 7 oz (81.4 kg)  06/21/21 178 lb (80.7 kg)  05/21/21 180 lb (81.6 kg)      Physical Exam Vitals and nursing note reviewed.  Constitutional:      Appearance: Normal appearance. She is not ill-appearing.  HENT:     Head: Normocephalic and atraumatic.     Right Ear: Tympanic membrane, ear canal and external ear normal. There is no impacted cerumen.     Left Ear: Tympanic membrane, ear canal and external ear normal. There is no impacted cerumen.  Eyes:     General:        Right eye: No discharge.        Left eye: No discharge.     Extraocular  Movements: Extraocular movements intact.     Conjunctiva/sclera: Conjunctivae normal.     Pupils: Pupils are equal, round, and reactive to light.  Neck:     Thyroid: No thyroid mass or thyromegaly.  Cardiovascular:     Rate and Rhythm: Normal rate and regular rhythm.     Pulses: Normal pulses.     Heart sounds: Normal heart sounds. No murmur heard. Pulmonary:     Effort: Pulmonary effort is normal. No respiratory distress.     Breath sounds: Normal breath sounds. No wheezing,  rhonchi or rales.  Abdominal:     General: Bowel sounds are normal. There is no distension.     Palpations: Abdomen is soft. There is no mass.     Tenderness: There is no abdominal tenderness. There is no guarding or rebound.     Hernia: No hernia is present.  Musculoskeletal:     Cervical back: Normal range of motion and neck supple. No rigidity.     Right lower leg: No edema.     Left lower leg: No edema.  Lymphadenopathy:     Cervical: No cervical adenopathy.  Skin:    General: Skin is warm and dry.     Findings: No rash.  Neurological:     General: No focal deficit present.     Mental Status: She is alert. Mental status is at baseline.     Comments:  Recall 3/3 Calculation 5/5 DLROW  Psychiatric:        Mood and Affect: Mood normal.        Behavior: Behavior normal.      Results for orders placed or performed in visit on 09/11/21  TSH  Result Value Ref Range   TSH 0.42 0.35 - 5.50 uIU/mL  Comprehensive metabolic panel  Result Value Ref Range   Sodium 138 135 - 145 mEq/L   Potassium 4.1 3.5 - 5.1 mEq/L   Chloride 102 96 - 112 mEq/L   CO2 28 19 - 32 mEq/L   Glucose, Bld 90 70 - 99 mg/dL   BUN 11 6 - 23 mg/dL   Creatinine, Ser 0.89 0.40 - 1.20 mg/dL   Total Bilirubin 0.7 0.2 - 1.2 mg/dL   Alkaline Phosphatase 73 39 - 117 U/L   AST 17 0 - 37 U/L   ALT 11 0 - 35 U/L   Total Protein 6.8 6.0 - 8.3 g/dL   Albumin 4.2 3.5 - 5.2 g/dL   GFR 67.51 >60.00 mL/min   Calcium 9.0 8.4 - 10.5 mg/dL   Lipid panel  Result Value Ref Range   Cholesterol 166 0 - 200 mg/dL   Triglycerides 119.0 0.0 - 149.0 mg/dL   HDL 53.60 >39.00 mg/dL   VLDL 23.8 0.0 - 40.0 mg/dL   LDL Cholesterol 89 0 - 99 mg/dL   Total CHOL/HDL Ratio 3    NonHDL 112.55   Vitamin B12  Result Value Ref Range   Vitamin B-12 211 211 - 911 pg/mL   Depression screen [facility] 2/9 09/18/2021 06/13/2020 03/04/2019 02/12/2018 02/07/2017  Decreased Interest 0 0 0 1 0  Down, Depressed, Hopeless 0 0 0 0 0  PHQ - 2 Score 0 0 0 1 0  Altered sleeping 3 0 0 2 -  Tired, decreased energy 3 0 0 3 -  Change in appetite 0 0 0 0 -  Feeling bad or failure about yourself  0 0 0 1 -  Trouble concentrating 0 0 0 0 -  Moving slowly or fidgety/restless 0 0 0 0 -  Suicidal thoughts 0 0 0 0 -  PHQ-9 Score 6 0 0 7 -  Difficult doing work/chores - Not difficult at all Not difficult at all Somewhat difficult -    GAD 7 : Generalized Anxiety Score 09/18/2021  Nervous, Anxious, on Edge 0  Control/stop worrying 0  Worry too much - different things 0  Trouble relaxing 0  Restless 0  Easily annoyed or irritable 0  Afraid - awful might happen 0  Total GAD 7 Score 0  Assessment & Plan:  This visit occurred during the SARS-CoV-2 public health emergency.  Safety protocols were in place, including screening questions prior to the visit, additional usage of staff PPE, and extensive cleaning of exam room while observing appropriate contact time as indicated for disinfecting solutions.   Problem List Items Addressed This Visit     Essential hypertension (Chronic)    Chronic, stable on losartan 50mg  daily.       Hyperlipidemia with target LDL less than 100 (Chronic)    Chronic, improved on atorvastatin 20mg  - continue. The 10-year ASCVD risk score (Arnett DK, et al., 2019) is: 7.9%   Values used to calculate the score:     Age: 5 years     Sex: Female     Is Non-Hispanic African American: No     Diabetic: No     Tobacco smoker: No     Systolic Blood  Pressure: 130 mmHg     Is BP treated: Yes     HDL Cholesterol: 53.6 mg/dL     Total Cholesterol: 166 mg/dL       Medicare annual wellness visit, subsequent (Chronic)    I have personally reviewed the Medicare Annual Wellness questionnaire and have noted 1. The patient's medical and social history 2. Their use of alcohol, tobacco or illicit drugs 3. Their current medications and supplements 4. The patient's functional ability including ADL's, fall risks, home safety risks and hearing or visual impairment. Cognitive function has been assessed and addressed as indicated.  5. Diet and physical activity 6. Evidence for depression or mood disorders The patients weight, height, BMI have been recorded in the chart. I have made referrals, counseling and provided education to the patient based on review of the above and I have provided the pt with a written personalized care plan for preventive services. Provider list updated.. See scanned questionairre as needed for further documentation. Reviewed preventative protocols and updated unless pt declined.       Obesity, Class I, BMI 30-34.9 (Chronic)    Continue to encourage healthy diet and lifestyle choices to affect sustainable weight loss.       Advanced care planning/counseling discussion (Chronic)    Advanced directives: completed at home but needs to get notarized. Thinks would want sister [name] to be HCPOA. Asked to bring [place] a copy.       Health maintenance examination (Chronic)    Preventative protocols reviewed and updated unless pt declined. Discussed healthy diet and lifestyle.       GERD (gastroesophageal reflux disease)    Continues omeprazole 40mg  daily.       Hypothyroidism    Chronic, stable on levothyroxine - continue.       Hot flashes    Gabapentin nightly has not helped.  She would like to try effexor as her sister has had good benefit with this - will start 37.5mg  XR once daily, discussed possible difficulty  tapering off medication once started. Update with effect.       Recurrent UTI    Stable period on low dose nitrofurantoin through urology.       Low vitamin B12 level    Levels too high while on 1061mcg daily replacement, now again low off replacement. rec start vit B12 530mcg MWF.         Meds ordered this encounter  Medications   vitamin B-12 (V-R VITAMIN B-12) 500 MCG tablet    Sig: Take 1 tablet (500 mcg total) by mouth every Monday, Wednesday,  and Friday.   venlafaxine XR (EFFEXOR XR) 37.5 MG 24 hr capsule    Sig: Take 1 capsule (37.5 mg total) by mouth daily with breakfast.    Dispense:  30 capsule    Refill:  6   No orders of the defined types were placed in this encounter.    Patient instructions: If interested, check with pharmacy about new 2 shot shingles series (shingrix).  Get pneumonia shot as well (Prevnar-20).  Bring [place] a copy of your advanced directive.  Start vitamin b12 554mcg MWF (3 days a well).  Try effexor low dose to help hot flashes, let me know how this helps.  You are doing well today Return as needed or in 1 year for next physical/wellness visit.   Follow up plan: Return in about 1 year (around 09/18/2022) for annual exam, prior fasting for blood work, medicare wellness visit.  Ria Bush, MD

## 2021-09-18 NOTE — Assessment & Plan Note (Signed)
Levels too high while on 1071mcg daily replacement, now again low off replacement. rec start vit B12 568mcg MWF.

## 2021-09-18 NOTE — Assessment & Plan Note (Signed)
Preventative protocols reviewed and updated unless pt declined. Discussed healthy diet and lifestyle.  

## 2021-09-20 ENCOUNTER — Encounter: Payer: Self-pay | Admitting: Urology

## 2021-09-20 ENCOUNTER — Ambulatory Visit: Payer: Medicare Other | Admitting: Urology

## 2021-09-20 ENCOUNTER — Other Ambulatory Visit: Payer: Self-pay

## 2021-09-20 VITALS — BP 161/76 | HR 76 | Ht 63.0 in | Wt 178.0 lb

## 2021-09-20 DIAGNOSIS — N390 Urinary tract infection, site not specified: Secondary | ICD-10-CM

## 2021-09-20 MED ORDER — NITROFURANTOIN MACROCRYSTAL 50 MG PO CAPS: 50.0000 mg | ORAL_CAPSULE | Freq: Every day | ORAL | 2 refills | Status: DC | PRN

## 2021-09-20 MED ORDER — ESTRADIOL 0.1 MG/GM VA CREA: TOPICAL_CREAM | VAGINAL | 12 refills | Status: DC

## 2021-09-20 NOTE — Progress Notes (Signed)
° °  09/20/2021 1:25 PM   Brittany [name] 17-Jun-1956 [id number]  Reason for visit: Follow up recurrent UTIs, foul-smelling urine  HPI: 66 year old female who had recurrent urinary infections and foul-smelling urine despite treatment with appropriate antibiotics.  She also underwent a normal cystoscopy, normal renal ultrasound, as well as a normal CT that showed no evidence of colovesical fistula.  She was started on a 62-month course of daily low-dose nitrofurantoin, 7 days of Augmentin twice daily, cranberry tablets, and topical estrogen cream.  She has done extremely well since that time and denies any further UTIs.  She denies any further foul-smelling urine after taking the course of Augmentin.  I recommended continuing the nitrofurantoin just postcoitally, continuing cranberry tablets and the topical estrogen cream.  Return precautions discussed.  Continue topical estrogen cream, cranberry tablets, transition nitrofurantoin to postcoital RTC 1 year   [place], [place] [address] [place], [place] [address] [phone]

## 2021-09-26 ENCOUNTER — Other Ambulatory Visit: Payer: Self-pay | Admitting: Family Medicine

## 2021-10-02 IMAGING — MG MM DIGITAL SCREENING BILAT W/ TOMO AND CAD
8 series · 8 of 24 positions shown · non-contrast
Comparison: Previous exam(s).

CLINICAL DATA: Screening.

EXAM:
DIGITAL SCREENING BILATERAL MAMMOGRAM WITH TOMOSYNTHESIS AND CAD
TECHNIQUE: Bilateral screening digital craniocaudal and mediolateral oblique
mammograms were obtained. Bilateral screening digital breast
tomosynthesis was performed. The images were evaluated with
computer-aided detection.

[L MLO synth-2D]
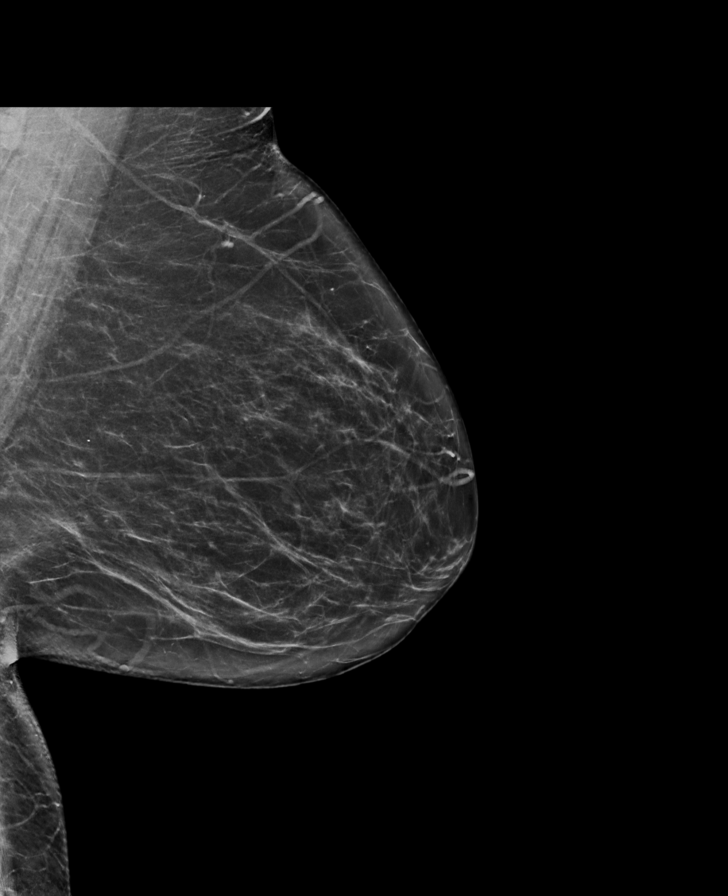

[R MLO synth-2D]
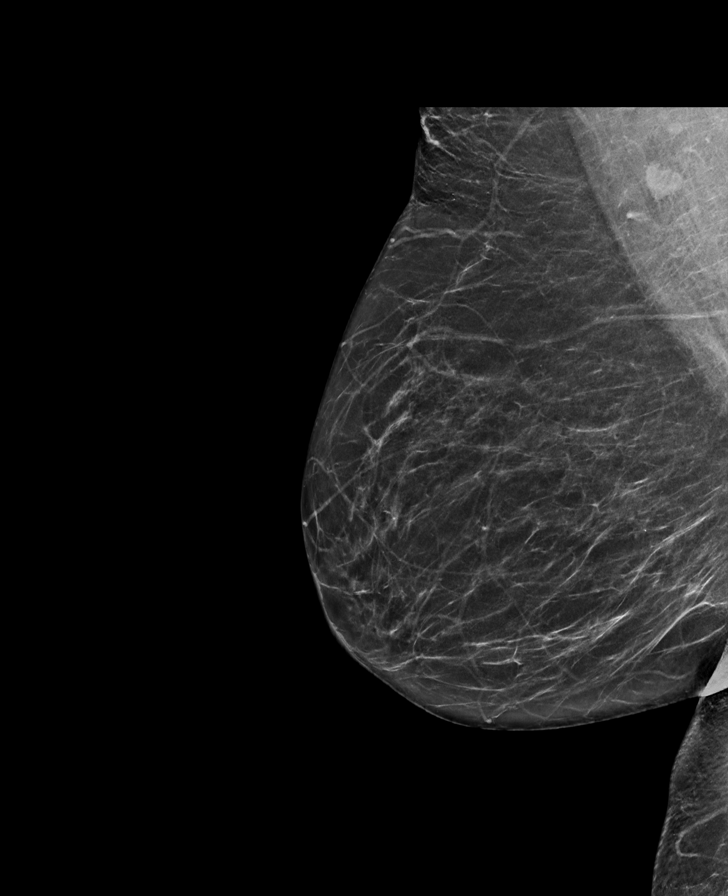

[R CC synth-2D]
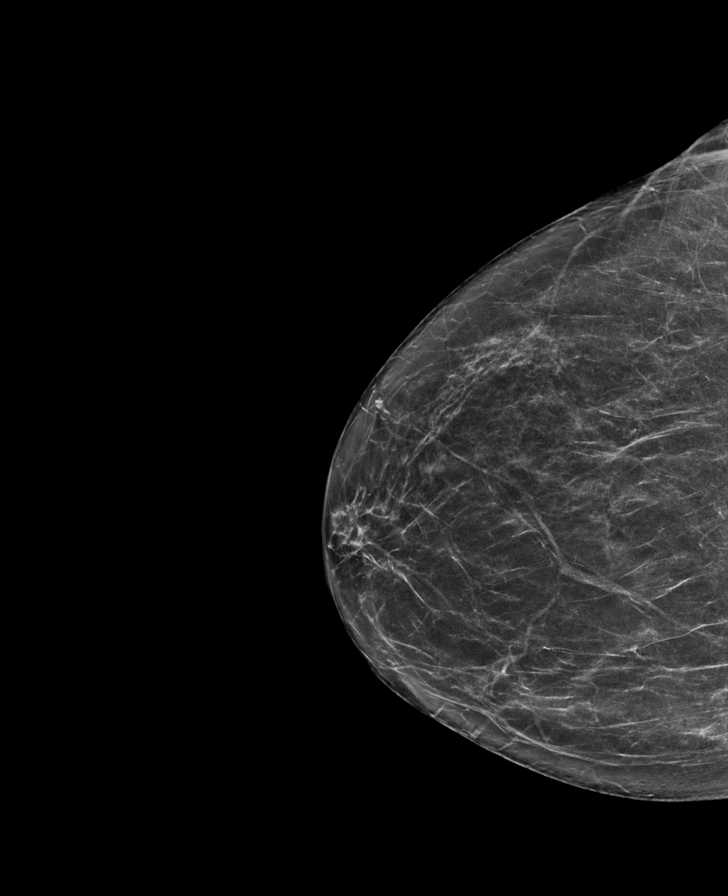

[L CC synth-2D]
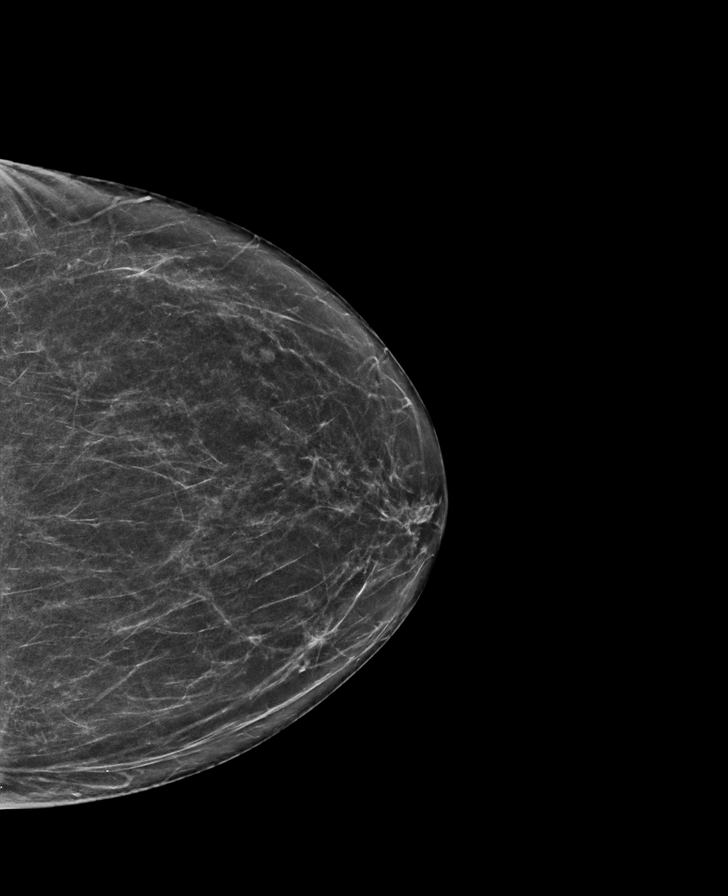

[L CC tomo · tomo slice 31/62.0]
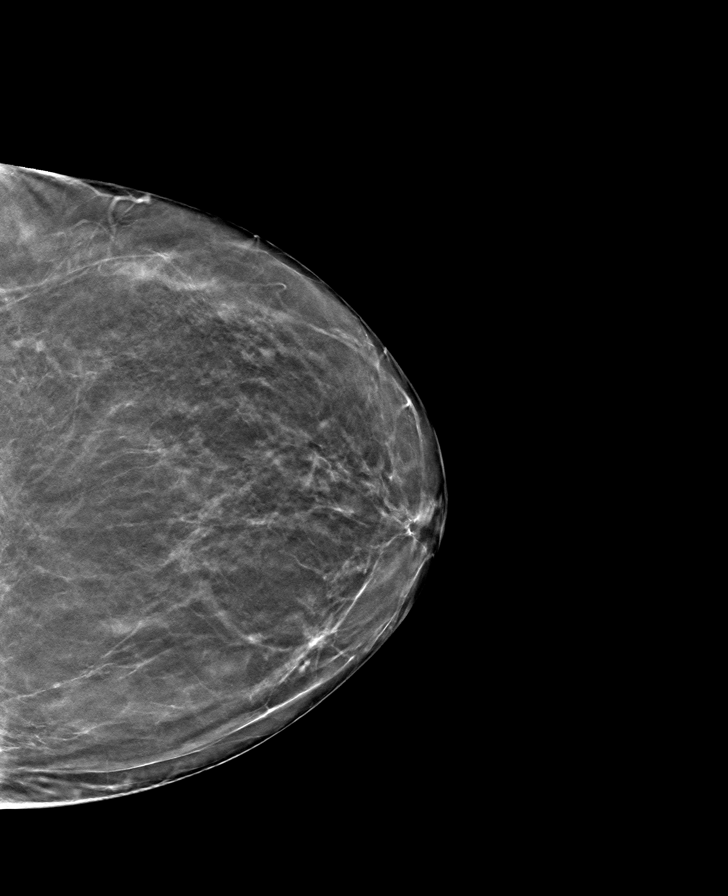

[R MLO tomo · tomo slice 34/67.0]
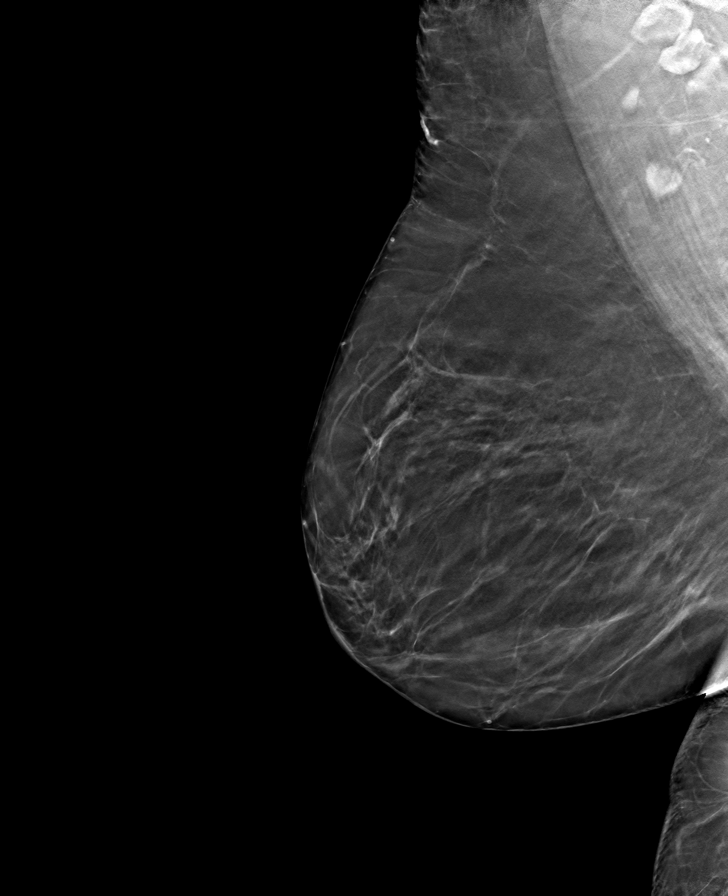

[L MLO tomo · tomo slice 39/76.0]
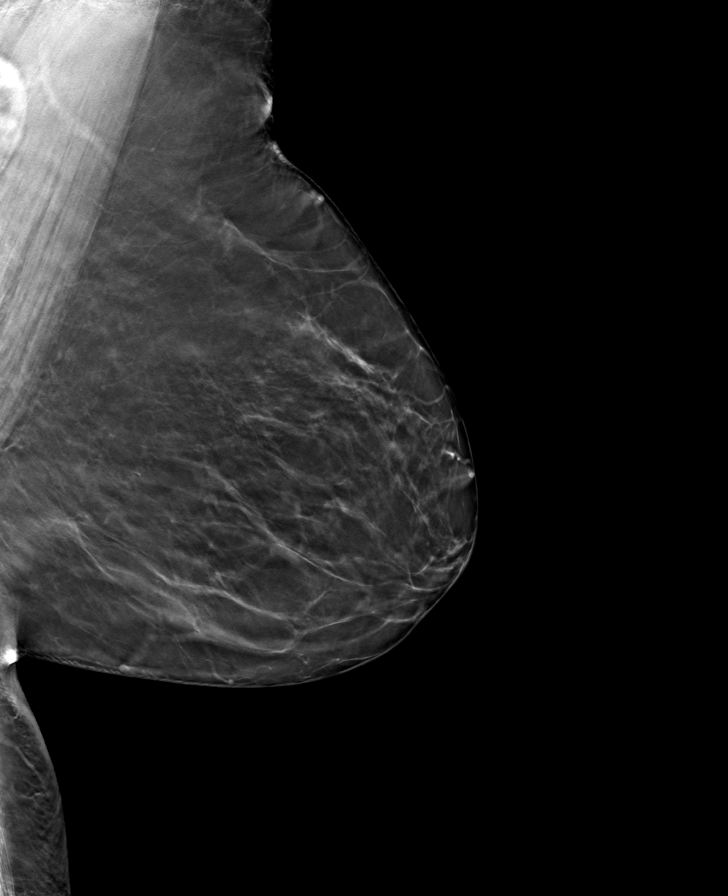

[R CC tomo · tomo slice 33/64.0]
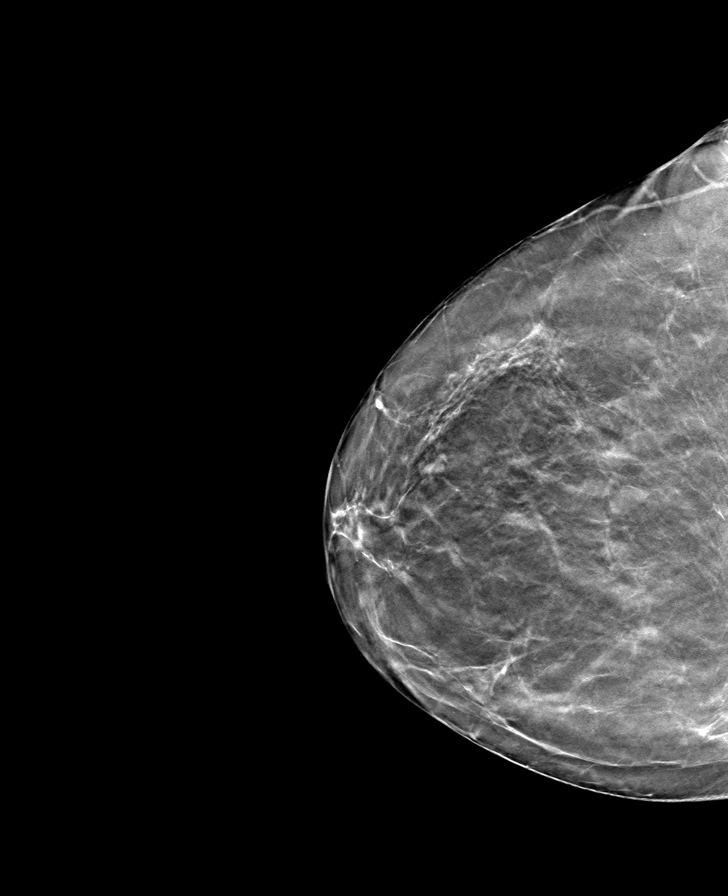

[8 of 24 positions shown; findings below may reference images not displayed]

ACR Breast Density Category b: There are scattered areas of
fibroglandular density.
FINDINGS: There are no findings suspicious for malignancy. The images were
evaluated with computer-aided detection.
IMPRESSION: No mammographic evidence of malignancy. A result letter of this
screening mammogram will be mailed directly to the patient.

RECOMMENDATION:
Screening mammogram in one year. (Code:WJ-I-BG6)

BI-RADS CATEGORY  1: Negative.

## 2021-10-10 ENCOUNTER — Other Ambulatory Visit: Payer: Self-pay | Admitting: Family Medicine

## 2021-10-14 ENCOUNTER — Other Ambulatory Visit: Payer: Self-pay | Admitting: Family Medicine

## 2021-10-14 DIAGNOSIS — E785 Hyperlipidemia, unspecified: Secondary | ICD-10-CM

## 2021-10-28 ENCOUNTER — Other Ambulatory Visit: Payer: Self-pay | Admitting: Family Medicine

## 2021-11-19 ENCOUNTER — Ambulatory Visit (INDEPENDENT_AMBULATORY_CARE_PROVIDER_SITE_OTHER): Payer: Medicare Other

## 2021-11-19 ENCOUNTER — Other Ambulatory Visit: Payer: Self-pay

## 2021-11-19 ENCOUNTER — Ambulatory Visit: Payer: Medicare Other | Admitting: Podiatry

## 2021-11-19 DIAGNOSIS — M2062 Acquired deformities of toe(s), unspecified, left foot: Secondary | ICD-10-CM | POA: Diagnosis not present

## 2021-11-19 DIAGNOSIS — M205X2 Other deformities of toe(s) (acquired), left foot: Secondary | ICD-10-CM | POA: Diagnosis not present

## 2021-11-19 DIAGNOSIS — M79672 Pain in left foot: Secondary | ICD-10-CM

## 2021-11-21 DIAGNOSIS — M1712 Unilateral primary osteoarthritis, left knee: Secondary | ICD-10-CM | POA: Diagnosis not present

## 2021-11-22 NOTE — Progress Notes (Signed)
Subjective:  ? ?Patient ID: Brittany [name], female   DOB: 66 y.o.   MRN: [id number]  ? ?HPI ?66 year old female presents the office today for concerns of left toe pain.  She states that the left toe sits up and she gets a callus underneath the toe area and also she states that the big toe rubs on the second toe she had to keep the nail trimmed once it rubs causing discomfort.  She denies any swelling redness or drainage.  She has a history of a first MPJ arthrodesis in 2015. ? ? ?Review of Systems  ?All other systems reviewed and are negative. ? ?Past Medical History:  ?Diagnosis Date  ? Allergy   ? Arthritis   ? neck pain and numbness left digits Vertell Limber), lower spine and knees  ? Chronic lower back pain   ? persistent after L1 cmp fx, with multilevel mild lumbar DDD - good resolution after L L5/S1 ESI ([place])  ? Colon polyps   ? last colonoscopy 2006? rpt due  ? Diverticulitis   ? Fatty liver 02/2012  ? on CT scan 02/2012  ? GERD (gastroesophageal reflux disease)   ? History of diverticulitis of colon   ? s/p resection of large intestine, rpt itis 02/2012  ? HLD (hyperlipidemia)   ? HTN (hypertension)   ? Hypothyroidism   ? Migraines   ? occasional, stress related  ? OSA (obstructive sleep apnea) 09/2016  ? mild to moderate, consider CPAP  ? Postmenopausal   ? HRT compound, previously premarin  ? Seasonal allergies   ? Vertebral compression fracture ([place]) 07/2012  ? L1 25% Vertell Limber) released without intervention  ? ? ?Past Surgical History:  ?Procedure Laterality Date  ? ANTERIOR FUSION CERVICAL SPINE  01/1999  ? cervical HNP  ? COLON RESECTION  2008  ? 2 ft removed, diverticulitis  ? COLONOSCOPY  03/2012  ? partial colectomy, scattered diverticulae [name], rpt 10 yrs  ? COLONOSCOPY  12/2020  ? diverticulosis, hem, rpt 10 yrs (Mansouraty)  ? ESI Left 06/2013, 12/2013, 09/2014  ? L5/S1 with good resolution of pain  ? ESOPHAGOGASTRODUODENOSCOPY  03/2012  ? mod gastritis, benign polyps, H pylori neg  ?  ESOPHAGOGASTRODUODENOSCOPY  12/2020  ? non-obstructive schatzki ring, 3cm HH, gastric polyps (Mansouraty)  ? FOOT SURGERY Left 01/2014  ? big toe fusion  ? [place], 2011  ? torn menisci (MRI 2010 L)  ? SPIROMETRY  2011  ? normal  ? TONSILLECTOMY  1964  ? TOTAL ABDOMINAL HYSTERECTOMY W/ BILATERAL SALPINGOOPHORECTOMY  06/2009  ? vag bleeding  ? UPPER GASTROINTESTINAL ENDOSCOPY    ? US ECHOCARDIOGRAPHY  08/2010  ? normal, EF 63%, mild valvular issues  ? ? ? ?Current Outpatient Medications:  ?  Ascorbic Acid (VITAMIN C) 1000 MG tablet, Take 1,000 mg by mouth daily., Disp: , Rfl:  ?  atorvastatin (LIPITOR) 20 MG tablet, TAKE 1 TABLET BY MOUTH EVERY DAY, Disp: 90 tablet, Rfl: 3 ?  Cholecalciferol (VITAMIN D3) 25 MCG (1000 UT) CAPS, Take 1 capsule (1,000 Units total) by mouth daily., Disp: 30 capsule, Rfl:  ?  Cranberry 500 MG CAPS, Take by mouth., Disp: , Rfl:  ?  estradiol (ESTRACE) 0.1 MG/GM vaginal cream, Estrogen Cream Instruction Discard applicator Apply pea sized amount to tip of finger to urethra before bed. Wash hands well after application. Use Monday, Wednesday and Friday, Disp: 42.5 g, Rfl: 12 ?  gabapentin (NEURONTIN) 600 MG tablet, 600 mg at bedtime., Disp: , Rfl:  ?  ibuprofen (ADVIL,MOTRIN) 200 MG tablet, Take 600 mg by mouth as needed., Disp: , Rfl:  ?  levothyroxine (SYNTHROID) 100 MCG tablet, TAKE 1 TABLET BY MOUTH DAILY  BEFORE BREAKFAST, Disp: 90 tablet, Rfl: 3 ?  losartan (COZAAR) 50 MG tablet, TAKE 1 TABLET BY MOUTH DAILY, Disp: 90 tablet, Rfl: 3 ?  Magnesium 200 MG TABS, Take 1 tablet (200 mg total) by mouth daily., Disp: , Rfl:  ?  Multiple Vitamins-Minerals (ALIVE WOMENS ENERGY PO), Take 1 tablet by mouth daily., Disp: , Rfl:  ?  nitrofurantoin (MACRODANTIN) 50 MG capsule, Take 1 capsule (50 mg total) by mouth daily as needed (at the time of sexual activity to prevent UTI)., Disp: 90 capsule, Rfl: 2 ?  Omega-3 Fatty Acids (FISH OIL PO), Take by mouth daily. Takes 2,400 mg, Disp: , Rfl:   ?  omeprazole (PRILOSEC) 40 MG capsule, TAKE 1 CAPSULE BY MOUTH  DAILY, Disp: 90 capsule, Rfl: 0 ?  QUICKVUE AT-HOME COVID-19 TEST KIT, See admin instructions., Disp: , Rfl:  ?  venlafaxine XR (EFFEXOR-XR) 37.5 MG 24 hr capsule, TAKE 1 CAPSULE BY MOUTH DAILY WITH BREAKFAST., Disp: 90 capsule, Rfl: 1 ?  vitamin B-12 (V-R VITAMIN B-12) 500 MCG tablet, Take 1 tablet (500 mcg total) by mouth every Monday, Wednesday, and Friday., Disp: , Rfl:  ?  zinc gluconate 50 MG tablet, Take 1 tablet (50 mg total) by mouth daily., Disp: , Rfl:  ? ?Allergies  ?Allergen Reactions  ? Cymbalta [Duloxetine Hcl] Other (See Comments)  ?  suicidality  ? Percocet [Oxycodone-Acetaminophen] Nausea And Vomiting and Other (See Comments)  ?  Nauseated, dizzy  ? ? ? ? ? ?   ?Objective:  ?Physical Exam  ?General: AAO x3, NAD ? ?Dermatological: Hyperkeratotic lesion submetatarsal 1 area of the crease on the bottom of her foot.  No underlying ulceration drainage or signs of infection.  There is no significant pain to the toenail this time there is no edema, erythema, drainage or pus or signs of infection.  No open lesions. ? ?Vascular: Dorsalis Pedis artery and Posterior Tibial artery pedal pulses are 2/4 bilateral with immedate capillary fill time.  There is no pain with calf compression, swelling, warmth, erythema.  ? ?Neruologic: Grossly intact via light touch bilateral.  Sensation intact with Thornell Mule monofilament ? ?Musculoskeletal: Hallux extensors is present.  Hallux abductus is also noted with medial deviation of the second digit causing irritation.  Muscular strength 5/5 in all groups tested bilateral. ? ?Gait: Unassisted, Nonantalgic.  ? ? ? ? ? ? ?   ?Assessment:  ? ?Hallux deformity left side ? ?   ?Plan:  ?-Treatment options discussed including all alternatives, risks, and complications ?-Etiology of symptoms were discussed ?-X-rays were obtained and reviewed with the patient.  Hallux extensus is noted as well as previous first  MPJ arthrodesis.  No evidence of acute fracture. ?-Discussed with conservative as well as surgical treatment options for this.  Discussed with her hallux IPJ fusion.  She will consider her options.  Meantime dispensed offloading pads that she can try.  Continue with wider toe box shoes to avoid any excess pressure.  Discussed also partial nail avulsion if needed ? ?Trula Slade DPM ? ?   ? ?

## 2021-11-26 ENCOUNTER — Ambulatory Visit: Payer: Medicare Other | Admitting: Podiatry

## 2021-12-08 ENCOUNTER — Other Ambulatory Visit: Payer: Self-pay | Admitting: Podiatry

## 2021-12-08 DIAGNOSIS — M205X2 Other deformities of toe(s) (acquired), left foot: Secondary | ICD-10-CM

## 2021-12-10 ENCOUNTER — Telehealth (INDEPENDENT_AMBULATORY_CARE_PROVIDER_SITE_OTHER): Payer: Medicare Other | Admitting: Family Medicine

## 2021-12-10 ENCOUNTER — Encounter: Payer: Self-pay | Admitting: Family Medicine

## 2021-12-10 DIAGNOSIS — J0110 Acute frontal sinusitis, unspecified: Secondary | ICD-10-CM

## 2021-12-10 MED ORDER — FLUTICASONE PROPIONATE 50 MCG/ACT NA SUSP: 2.0000 | Freq: Every day | NASAL | 1 refills | Status: DC

## 2021-12-10 MED ORDER — AMOXICILLIN-POT CLAVULANATE 875-125 MG PO TABS: 1.0000 | ORAL_TABLET | Freq: Two times a day (BID) | ORAL | 0 refills | Status: AC

## 2021-12-10 NOTE — Telephone Encounter (Signed)
Spoke with pt asking about sxs.  C/o cough, postnasal drainage, sinus pain/pressure in forehead and behind eyes.  Denies any fever or other sxs.  H/o sinus inf.  Offered virtual with another provider, pt declines and requests Dr. Darnell Level send something in.   ?

## 2021-12-10 NOTE — Assessment & Plan Note (Addendum)
Story consistent with acute sinus infection. Of short duration however given unilateral symptoms and severity of symptoms do recommend antibiotic course - will send augmentin 10d course and also have her start flonase OTC. Update if not improving with treatment. Pt agrees with plan.  ?

## 2021-12-10 NOTE — Progress Notes (Signed)
? ? Patient ID: Brittany [name], female    DOB: 11/09/1955, 66 y.o.   MRN: [id number] ? ?Virtual visit completed through MyChart, a video enabled telemedicine application. Due to national recommendations of social distancing due to COVID-19, a virtual visit is felt to be most appropriate for this patient at this time. Reviewed limitations, risks, security and privacy concerns of performing a virtual visit and the availability of in person appointments. I also reviewed that there may be a patient responsible charge related to this service. The patient agreed to proceed.  ? ?Interactive audio and video telecommunications were attempted between myself and Brittany [name], however failed due to patient having technical difficulties. We continued and completed visit with audio only.  ?Time: 4:55pm - 5:17pm  ? ?Patient location: home ?Provider location: Financial controller at [facility], office ?Persons participating in this virtual visit: patient, provider  ? ?If any vitals were documented, they were collected by patient at home unless specified below.   ? ?BP 126/74   Pulse 86   Temp 99.8 ?F (37.7 ?C)   Ht _0  (1.6 m)   Wt 176 lb (79.8 kg)   BMI 31.18 kg/m?   ? ?CC: sinus symptoms ?Subjective:  ? ?HPI: ?Brittany [name] is a 66 y.o. female presenting on 12/10/2021 for Sinus Problem (C/o cough, postnasal drainage, sinus pain/pressure in forehead and behind bilateral eyes.  Sxs started 12/08/21. Denies fever or other sxs.  Pt will do home COVID test and have results for Dr. Darnell Level. ) ? ? ?3d h/o productive cough of colored mucous, significant PNdrainage, sinus pain, pressure, forehead pressure behind eyes. Hoarse voice. L earache. Head >> chest congestion. L>R sinus/facial pain.  ? ?No fevers/chills, tooth pain, dyspnea or wheezing.  ? ?This started after she took care of sick grandchildren - they saw pediatrician, both treated for sinusitis with improvement.  ? ?COVID test done today - negative. ? ?Drinking plenty of water.  ?Continues  vitamin C, benadryl, dayquil and nyquil. Also tried delsym without benefit.  ?   ? ?Relevant past medical, surgical, family and social history reviewed and updated as indicated. Interim medical history since our last visit reviewed. ?Allergies and medications reviewed and updated. ?Outpatient Medications Prior to Visit  ?Medication Sig Dispense Refill  ? Ascorbic Acid (VITAMIN C) 1000 MG tablet Take 1,000 mg by mouth daily.    ? atorvastatin (LIPITOR) 20 MG tablet TAKE 1 TABLET BY MOUTH EVERY DAY 90 tablet 3  ? Cholecalciferol (VITAMIN D3) 25 MCG (1000 UT) CAPS Take 1 capsule (1,000 Units total) by mouth daily. 30 capsule   ? Cranberry 500 MG CAPS Take by mouth.    ? estradiol (ESTRACE) 0.1 MG/GM vaginal cream Estrogen Cream Instruction Discard applicator Apply pea sized amount to tip of finger to urethra before bed. Wash hands well after application. Use Monday, Wednesday and Friday 42.5 g 12  ? gabapentin (NEURONTIN) 600 MG tablet 600 mg at bedtime.    ? ibuprofen (ADVIL,MOTRIN) 200 MG tablet Take 600 mg by mouth as needed.    ? levothyroxine (SYNTHROID) 100 MCG tablet TAKE 1 TABLET BY MOUTH DAILY  BEFORE BREAKFAST 90 tablet 3  ? losartan (COZAAR) 50 MG tablet TAKE 1 TABLET BY MOUTH DAILY 90 tablet 3  ? Magnesium 200 MG TABS Take 1 tablet (200 mg total) by mouth daily.    ? Multiple Vitamins-Minerals (ALIVE WOMENS ENERGY PO) Take 1 tablet by mouth daily.    ? nitrofurantoin (MACRODANTIN) 50 MG capsule Take 1 capsule (50 mg  total) by mouth daily as needed (at the time of sexual activity to prevent UTI). 90 capsule 2  ? Omega-3 Fatty Acids (FISH OIL PO) Take by mouth daily. Takes 2,400 mg    ? omeprazole (PRILOSEC) 40 MG capsule TAKE 1 CAPSULE BY MOUTH  DAILY 90 capsule 0  ? QUICKVUE AT-HOME COVID-19 TEST KIT See admin instructions.    ? venlafaxine XR (EFFEXOR-XR) 37.5 MG 24 hr capsule TAKE 1 CAPSULE BY MOUTH DAILY WITH BREAKFAST. 90 capsule 1  ? vitamin B-12 (V-R VITAMIN B-12) 500 MCG tablet Take 1 tablet (500 mcg  total) by mouth every Monday, Wednesday, and Friday.    ? zinc gluconate 50 MG tablet Take 1 tablet (50 mg total) by mouth daily.    ? ?No facility-administered medications prior to visit.  ?  ? ?Per HPI unless specifically indicated in ROS section below ?Review of Systems ?Objective:  ?BP 126/74   Pulse 86   Temp 99.8 ?F (37.7 ?C)   Ht _0  (1.6 m)   Wt 176 lb (79.8 kg)   BMI 31.18 kg/m?   ?Wt Readings from Last 3 Encounters:  ?12/10/21 176 lb (79.8 kg)  ?09/20/21 178 lb (80.7 kg)  ?09/18/21 179 lb 7 oz (81.4 kg)  ?  ?  ? ?Physical exam: ?Gen: alert, NAD, not ill appearing ?Pulm: speaks in complete sentences without increased work of breathing ?Psych: normal mood, normal thought content  ? ?   ?Assessment & Plan:  ? ?Problem List Items Addressed This Visit   ? ? Acute non-recurrent sinusitis  ?  Story consistent with acute sinus infection. Of short duration however given unilateral symptoms and severity of symptoms do recommend antibiotic course - will send augmentin 10d course and also have her start flonase OTC. Update if not improving with treatment. Pt agrees with plan.  ?  ?  ? Relevant Medications  ? fluticasone (FLONASE) 50 MCG/ACT nasal spray  ? amoxicillin-clavulanate (AUGMENTIN) 875-125 MG tablet  ?  ? ?Meds ordered this encounter  ?Medications  ? fluticasone (FLONASE) 50 MCG/ACT nasal spray  ?  Sig: Place 2 sprays into both nostrils daily.  ?  Dispense:  16 g  ?  Refill:  1  ? amoxicillin-clavulanate (AUGMENTIN) 875-125 MG tablet  ?  Sig: Take 1 tablet by mouth 2 (two) times daily for 10 days.  ?  Dispense:  20 tablet  ?  Refill:  0  ? ?No orders of the defined types were placed in this encounter. ? ? ?I discussed the assessment and treatment plan with the patient. The patient was provided an opportunity to ask questions and all were answered. The patient agreed with the plan and demonstrated an understanding of the instructions. The patient was advised to call back or seek an in-person evaluation  if the symptoms worsen or if the condition fails to improve as anticipated. ? ?Follow up plan: ?No follow-ups on file. ? ?Ria Bush, MD   ?

## 2021-12-10 NOTE — Telephone Encounter (Signed)
Spoke with pt offering 4:30 MyChart visit today.  Pt agrees.  Will have front office add pt to schedule.  ?

## 2021-12-10 NOTE — Telephone Encounter (Signed)
Plz offer 4:30pm virtual appt with me. Thanks  ?

## 2021-12-25 ENCOUNTER — Ambulatory Visit: Payer: Medicare Other | Admitting: Family Medicine

## 2021-12-27 ENCOUNTER — Other Ambulatory Visit: Payer: Self-pay | Admitting: Family Medicine

## 2022-01-01 ENCOUNTER — Other Ambulatory Visit: Payer: Self-pay | Admitting: Family Medicine

## 2022-01-21 ENCOUNTER — Encounter: Payer: Self-pay | Admitting: Cardiology

## 2022-01-21 ENCOUNTER — Ambulatory Visit: Payer: Medicare Other | Admitting: Cardiology

## 2022-01-21 VITALS — BP 136/77 | HR 70 | Ht 63.0 in | Wt 177.4 lb

## 2022-01-21 DIAGNOSIS — I10 Essential (primary) hypertension: Secondary | ICD-10-CM

## 2022-01-21 DIAGNOSIS — R002 Palpitations: Secondary | ICD-10-CM

## 2022-01-21 DIAGNOSIS — E785 Hyperlipidemia, unspecified: Secondary | ICD-10-CM

## 2022-01-21 DIAGNOSIS — E669 Obesity, unspecified: Secondary | ICD-10-CM | POA: Diagnosis not present

## 2022-01-21 NOTE — Patient Instructions (Addendum)

## 2022-01-21 NOTE — Progress Notes (Signed)
Primary Care Provider: Ria Bush, MD Cardiologist: Brittany Hew, MD Electrophysiologist: None  Clinic Note: No chief complaint on file. ===================================  ASSESSMENT/PLAN   Problem List Items Addressed This Visit       Cardiology Problems   Essential hypertension (Chronic)    Borderline pressures today, but usually better than this. On rosuvastatin.  Normal renal function and potassium level.       Relevant Orders   EKG 12-Lead (Completed)   Hyperlipidemia with target LDL less than 100 (Chronic)    Dramatic improvement with 20 mg atorvastatin.  Now with an LDL of 89.  Within goal that we would like to be.  Encouraged continued exercise and dietary modification.  Continue atorvastatin.       Relevant Orders   EKG 12-Lead (Completed)     Other   Obesity, Class I, BMI 30-34.9 (Chronic)    She has lost about 6 pounds in the last year or so with dietary modification.  Encouraged continued efforts at.  BMI is now down to 31.       Palpitations - Primary (Chronic)    Well-controlled.  No longer using beta-blocker PRN.       Relevant Orders   EKG 12-Lead (Completed)    ===================================  HPI:    Brittany [name] is a 66 y.o. female with a PMH notable for OSA, HTN and fatty liver disease who presents today for delayed annual follow-up.  She presents here today along with her husband, Brittany [name] --> she returns today at the request of Brittany Bush, MD.  Brittany [name] was last seen in February 2022: Had just been started on Statin by PCP -continue to be tolerating it relatively well.  Acknowledge that she was not really eating appropriately.  Eating a lot of homemade bread with butter etc.  Despite that, she really was not putting a lot of weight.  But she was never excessive GERD.  Palpitations were relatively controlled -> noted them every now and then.  They would come and go without any rhyme or reason.  Usually at the  end the day when she is trying to relax and her mind is still racing.  Notes that her heart rate will go up and will be irregularity to it.  Oftentimes she will feel intermittent forceful beats with some regularity.  Work-up necessarily requiring as needed metoprolol consider not happening very frequently enough to bother her.  Also analogous to not really doing as well as she should with hydration.  Recent Hospitalizations: none  Reviewed  CV studies:    The following studies were reviewed today: (if available, images/films reviewed: From Epic Chart or Care Everywhere) none:   Interval History:   Brittany [name] returns here today with her husband Brittany [name] stating that her palpitations are doing much better than she can remember.  She is really doing well and, and is no longer given the beta-blocker.  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  Despite this, she seems to be managing it well and is doing fine.  Her blood pressures drifted up little bit, but usually well controlled on current dose of losartan.  She is still on atorvastatin and is tolerating it relatively well.  Her LDL remarkably is down to 89 from 152.  Total cholesterol down to 166 from 230.  She is also made some  changes to her diet.  Pretty stable from cardiac standpoint no major symptoms Cardiovascular ROS: no chest pain or dyspnea on exertion positive for - irregular heartbeat, palpitations, and both of these are very well controlled-has not required beta-blocker in several months. negative for - edema, loss of consciousness, orthopnea, paroxysmal nocturnal dyspnea, rapid heart rate, shortness of breath, or near syncope, TIA/amaurosis fugax or claudication  REVIEWED OF SYSTEMS   Review of Systems  Constitutional:  Negative for malaise/fatigue (Energy level is doing well)  and weight loss.  HENT:  Negative for nosebleeds.   Respiratory: Negative.    Cardiovascular:        Per HPI  Gastrointestinal:  Positive for heartburn (Doing much better since she is adjusted her diet). Negative for blood in stool and melena.  Genitourinary:  Negative for hematuria.  Musculoskeletal:  Positive for joint pain. Negative for falls.  Neurological:  Negative for dizziness and focal weakness.  Endo/Heme/Allergies:  Does not bruise/bleed easily.  Psychiatric/Behavioral:  Negative for depression and memory loss. The patient is nervous/anxious (Off and on anxiety related to all of the responsibilities she now has to take on). The patient does not have insomnia.   All other systems reviewed and are negative.  I have reviewed and (if needed) personally updated the patient's problem list, medications, allergies, past medical and surgical history, social and family history.   PAST MEDICAL HISTORY   Past Medical History:  Diagnosis Date   Allergy    Arthritis    neck pain and numbness left digits Vertell Limber), lower spine and knees   Chronic lower back pain    persistent after L1 cmp fx, with multilevel mild lumbar DDD - good resolution after L L5/S1 ESI ([place])   Colon polyps    last colonoscopy 2006? rpt due   Diverticulitis    Fatty liver 02/2012   on CT scan 02/2012   GERD (gastroesophageal reflux disease)    History of diverticulitis of colon    s/p resection of large intestine, rpt itis 02/2012   HLD (hyperlipidemia)    HTN (hypertension)    Hypothyroidism    Migraines    occasional, stress related   OSA (obstructive sleep apnea) 09/2016   mild to moderate, consider CPAP   Postmenopausal    HRT compound, previously premarin   Seasonal allergies    Vertebral compression fracture ([place]) 07/2012   L1 25% Vertell Limber) released without intervention    PAST SURGICAL HISTORY   Past Surgical History:  Procedure Laterality Date   ANTERIOR FUSION CERVICAL SPINE  01/1999    cervical HNP   COLON RESECTION  2008   2 ft removed, diverticulitis   COLONOSCOPY  03/2012   partial colectomy, scattered diverticulae Brittany [name]), rpt 10 yrs   COLONOSCOPY  12/2020   diverticulosis, hem, rpt 10 yrs ([place])   ESI Left 06/2013, 12/2013, 09/2014   L5/S1 with good resolution of pain   ESOPHAGOGASTRODUODENOSCOPY  03/2012   mod gastritis, benign polyps, H pylori neg   ESOPHAGOGASTRODUODENOSCOPY  12/2020   non-obstructive schatzki ring, 3cm HH, gastric polyps ([place])   FOOT SURGERY Left 01/2014   big toe fusion   [place], 2011   torn menisci (MRI 2010 L)   SPIROMETRY  2011   normal   TONSILLECTOMY  1964   TOTAL ABDOMINAL HYSTERECTOMY W/ BILATERAL SALPINGOOPHORECTOMY  06/2009   vag bleeding   UPPER GASTROINTESTINAL ENDOSCOPY     US ECHOCARDIOGRAPHY  08/2010   normal, EF 63%, mild valvular issues  Immunization History  Administered Date(s) Administered   Influenza Inj Mdck Quad Pf 06/01/2018   Influenza Split 09/26/2011   Influenza, High Dose Seasonal PF 05/30/2021   Influenza,inj,Quad PF,6+ Mos 06/12/2016, 06/01/2018, 06/03/2019   Influenza-Unspecified 06/10/2015, 06/27/2017, 06/24/2020   PFIZER(Purple Top)SARS-COV-2 Vaccination 12/03/2019, 12/28/2019, 07/01/2020, 01/06/2021   Pfizer Covid-19 Vaccine Bivalent Booster 49yr & up 05/30/2021   Tdap 09/26/2011   Zoster, Live 06/12/2016    MEDICATIONS/ALLERGIES   Current Meds  Medication Sig   Ascorbic Acid (VITAMIN C) 1000 MG tablet Take 1,000 mg by mouth daily.   atorvastatin (LIPITOR) 20 MG tablet TAKE 1 TABLET BY MOUTH EVERY DAY   Cholecalciferol (VITAMIN D3) 25 MCG (1000 UT) CAPS Take 1 capsule (1,000 Units total) by mouth daily.   Cranberry 500 MG CAPS Take by mouth.   estradiol (ESTRACE) 0.1 MG/GM vaginal cream Estrogen Cream Instruction Discard applicator Apply pea sized amount to tip of finger to urethra before bed. Wash hands well after application. Use Monday, Wednesday and Friday    gabapentin (NEURONTIN) 600 MG tablet 600 mg at bedtime.   ibuprofen (ADVIL,MOTRIN) 200 MG tablet Take 600 mg by mouth as needed.   levothyroxine (SYNTHROID) 100 MCG tablet TAKE 1 TABLET BY MOUTH DAILY  BEFORE BREAKFAST   losartan (COZAAR) 50 MG tablet TAKE 1 TABLET BY MOUTH DAILY   Magnesium 200 MG TABS Take 1 tablet (200 mg total) by mouth daily.   Multiple Vitamins-Minerals (ALIVE WOMENS ENERGY PO) Take 1 tablet by mouth daily.   Omega-3 Fatty Acids (FISH OIL PO) Take by mouth daily. Takes 2,400 mg   omeprazole (PRILOSEC) 40 MG capsule TAKE 1 CAPSULE BY MOUTH DAILY   QUICKVUE AT-HOME COVID-19 TEST KIT See admin instructions.   venlafaxine XR (EFFEXOR-XR) 37.5 MG 24 hr capsule TAKE 1 CAPSULE BY MOUTH DAILY WITH BREAKFAST.   vitamin B-12 (V-R VITAMIN B-12) 500 MCG tablet Take 1 tablet (500 mcg total) by mouth every Monday, Wednesday, and Friday.   zinc gluconate 50 MG tablet Take 1 tablet (50 mg total) by mouth daily.   [DISCONTINUED] fluticasone (FLONASE) 50 MCG/ACT nasal spray SPRAY 2 SPRAYS INTO EACH NOSTRIL EVERY DAY    Allergies  Allergen Reactions   Cymbalta [Duloxetine Hcl] Other (See Comments)    suicidality   Percocet [Oxycodone-Acetaminophen] Nausea And Vomiting and Other (See Comments)    Nauseated, dizzy    SOCIAL HISTORY/FAMILY HISTORY   Reviewed in Epic:  Pertinent findings:  Social History   Tobacco Use   Smoking status: Never   Smokeless tobacco: Never  Vaping Use   Vaping Use: Never used  Substance Use Topics   Alcohol use: Yes    Comment: occasional beer on the weekends   Drug use: No   Social History   Social History Narrative   Caffeine: 2-3 cups/day (1-2 coffee); occasional beer   Lives with husband and [place], 4 dogs   Activity: no scheduled - enjoys walking, but just usually run around doing shopping etc. Does not do routine exercise.   Diet: lots of water, fruits/vegetable    OBJCTIVE -PE, EKG, labs   Wt Readings from Last 3 Encounters:   01/21/22 177 lb 6.4 oz (80.5 kg)  12/10/21 176 lb (79.8 kg)  09/20/21 178 lb (80.7 kg)  11/03/2020 -183 pounds;  83 kg  Physical Exam: BP 136/77   Pulse 70   Ht 5' 3"  (1.6 m)   Wt 177 lb 6.4 oz (80.5 kg)   SpO2 97%   BMI 31.42 kg/m  Physical Exam Vitals reviewed.  Constitutional:      General: She is not in acute distress.    Appearance: Normal appearance. She is obese. She is not ill-appearing or toxic-appearing.     Comments: Well-nourished, well-groomed.  Healthy-appearing.  HENT:     Head: Normocephalic and atraumatic.  Neck:     Vascular: No carotid bruit or JVD.  Cardiovascular:     Rate and Rhythm: Normal rate and regular rhythm. No extrasystoles are present.    Chest Wall: PMI is not displaced.     Pulses: Normal pulses.     Heart sounds: S1 normal and S2 normal. No murmur heard.   No friction rub. No gallop.  Pulmonary:     Effort: Pulmonary effort is normal. No respiratory distress.     Breath sounds: Normal breath sounds. No wheezing, rhonchi or rales.  Chest:     Chest wall: No tenderness.  Musculoskeletal:        General: No swelling. Normal range of motion.     Cervical back: Normal range of motion and neck supple.  Skin:    General: Skin is warm and dry.     Coloration: Skin is not jaundiced or pale.  Neurological:     General: No focal deficit present.     Mental Status: She is alert and oriented to person, place, and time.     Gait: Gait normal.  Psychiatric:        Mood and Affect: Mood normal.        Behavior: Behavior normal.        Thought Content: Thought content normal.        Judgment: Judgment normal.    Adult ECG Report  Rate: 70 ;  Rhythm: normal sinus rhythm and Left axis deviation.  Otherwise normal intervals and durations. ;   Narrative Interpretation:  Stable  Recent Labs: Reviewed. Lab Results  Component Value Date   CHOL 166 09/11/2021   HDL 53.60 09/11/2021   LDLCALC 89 09/11/2021   LDLDIRECT 154.0 08/14/2012   TRIG 119.0  09/11/2021   CHOLHDL 3 09/11/2021   Lab Results  Component Value Date   CREATININE 0.89 09/11/2021   BUN 11 09/11/2021   NA 138 09/11/2021   K 4.1 09/11/2021   CL 102 09/11/2021   CO2 28 09/11/2021      Latest Ref Rng & Units 04/20/2021    1:15 PM 06/09/2019    8:29 AM 07/31/2016    4:48 PM  CBC  WBC 4.0 - 10.5 K/uL 10.3   5.8   9.1    Hemoglobin 12.0 - 15.0 g/dL 13.7   13.4   13.7    Hematocrit 36.0 - 46.0 % 41.6   40.9   40.8    Platelets 150.0 - 400.0 K/uL 266.0   210.0   257      No results found for: HGBA1C Lab Results  Component Value Date   TSH 0.42 09/11/2021    ==================================================  COVID-19 Education: The signs and symptoms of COVID-19 were discussed with the patient and how to seek care for testing (follow up with PCP or arrange E-visit).    I spent a total of 14 minutes with the patient spent in direct patient consultation.  Additional time spent with chart review  / charting (studies, outside notes, etc): 12 min Total Time: 26 min  Current medicines are reviewed at length with the patient today.  (+/- concerns) N/A  This visit occurred during the SARS-CoV-2  public health emergency.  Safety protocols were in place, including screening questions prior to the visit, additional usage of staff PPE, and extensive cleaning of exam room while observing appropriate contact time as indicated for disinfecting solutions.  Notice: This dictation was prepared with Dragon dictation along with smart phrase technology. Any transcriptional errors that result from this process are unintentional and may not be corrected upon review.  Studies Ordered:   Orders Placed This Encounter  Procedures   EKG 12-Lead   No orders of the defined types were placed in this encounter.   Patient Instructions / Medication Changes & Studies & Tests Ordered   Patient Instructions  Medication Instructions:   No changes *If you need a refill on your cardiac  medications before your next appointment, please call your pharmacy*   Lab Work: Not needed    Testing/Procedures: Not needed   Follow-Up: At [facility], you and your health needs are our priority.  As part of our continuing mission to provide you with exceptional heart care, we have created designated Provider Care Teams.  These Care Teams include your primary Cardiologist (physician) and Advanced Practice Providers (APPs -  Physician Assistants and Nurse Practitioners) who all work together to provide you with the care you need, when you need it.     Your next appointment:   12 month(s)  The format for your next appointment:   In Person  Provider:   Glenetta Hew, MD     Brittany [name], M.D., M.S. Interventional Cardiologist   Pager # [phone] Phone # [phone] [address] [place], [place] [address]   Thank you for choosing Heartcare at [facility]!!

## 2022-01-28 ENCOUNTER — Other Ambulatory Visit: Payer: Self-pay | Admitting: Family Medicine

## 2022-02-10 NOTE — Assessment & Plan Note (Signed)
Dramatic improvement with 20 mg atorvastatin.  Now with an LDL of 89.  Within goal that we would like to be.  Encouraged continued exercise and dietary modification.  Continue atorvastatin.

## 2022-02-10 NOTE — Assessment & Plan Note (Signed)
She has lost about 6 pounds in the last year or so with dietary modification.  Encouraged continued efforts at.  BMI is now down to 31.

## 2022-02-10 NOTE — Assessment & Plan Note (Signed)
Well-controlled.  No longer using beta-blocker PRN.

## 2022-02-10 NOTE — Assessment & Plan Note (Signed)
Borderline pressures today, but usually better than this. On rosuvastatin.  Normal renal function and potassium level.

## 2022-02-25 ENCOUNTER — Encounter: Payer: Self-pay | Admitting: Family Medicine

## 2022-02-25 MED ORDER — VENLAFAXINE HCL ER 37.5 MG PO CP24: 37.5000 mg | ORAL_CAPSULE | Freq: Every day | ORAL | 0 refills | Status: DC

## 2022-03-11 ENCOUNTER — Other Ambulatory Visit: Payer: Self-pay | Admitting: Family Medicine

## 2022-03-11 DIAGNOSIS — Z1231 Encounter for screening mammogram for malignant neoplasm of breast: Secondary | ICD-10-CM

## 2022-03-15 ENCOUNTER — Ambulatory Visit
Admission: RE | Admit: 2022-03-15 | Discharge: 2022-03-15 | Disposition: A | Discharge status: Home / Self Care | Payer: Medicare Other | Source: Ambulatory Visit | Attending: Family Medicine | Admitting: Family Medicine

## 2022-03-15 DIAGNOSIS — Z1231 Encounter for screening mammogram for malignant neoplasm of breast: Secondary | ICD-10-CM | POA: Diagnosis not present

## 2022-04-09 IMAGING — CT CT ABD-PEL WO/W CM
2 of 9 series · 12 of 46 positions shown, 18 images · IV contrast (omnipaque)
Comparison: None.

CLINICAL DATA: Abdominal pain, acute nonlocalized. History of
diverticulitis.

EXAM:
CT ABDOMEN AND PELVIS WITHOUT AND WITH CONTRAST
TECHNIQUE: Multidetector CT imaging of the abdomen and pelvis was performed
following the standard protocol before and following the bolus
administration of intravenous contrast.
CONTRAST:  100mL OMNIPAQUE IOHEXOL 300 MG/ML  SOLN

[Series 2: abd pelvis pre 5.00 · axial · non-contrast · 0.67mm/px · z∈[-1472,-1117]mm · 9 of 89 slices shown, 15 images]
[im 9/89  soft-tissue]
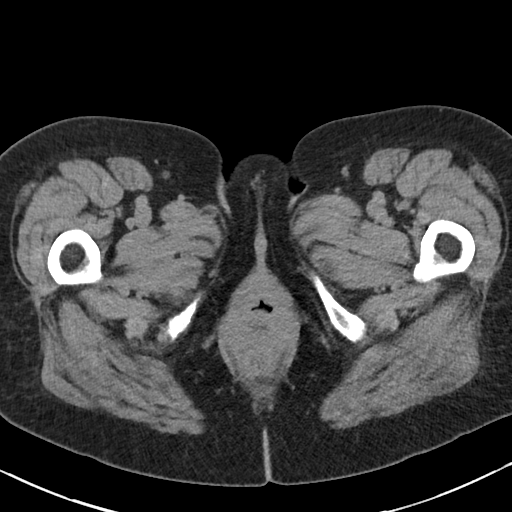
[im 9/89  bone]
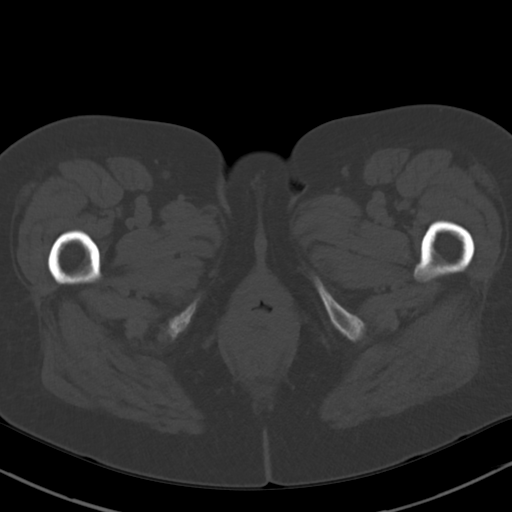
[im 18/89  soft-tissue]
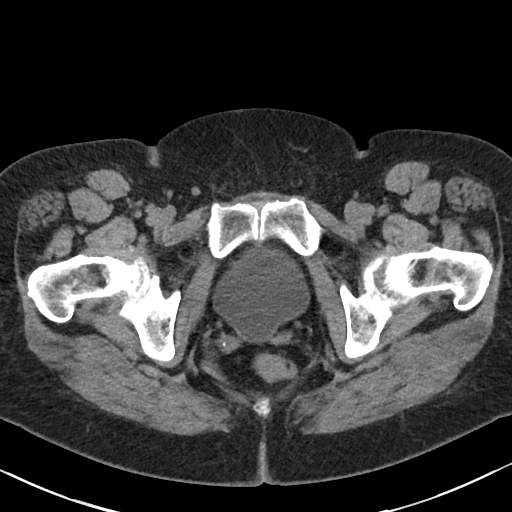
[im 27/89  soft-tissue]
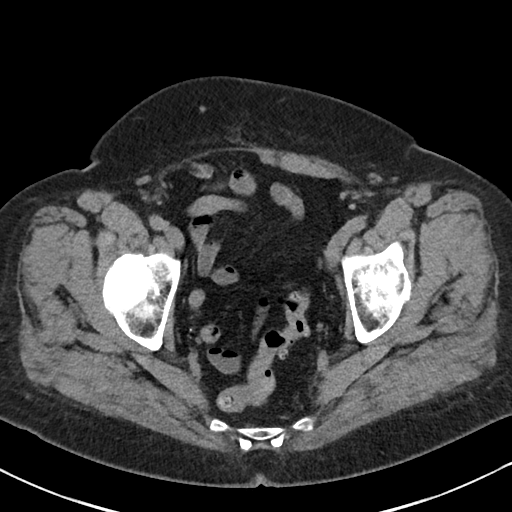
[im 36/89  soft-tissue]
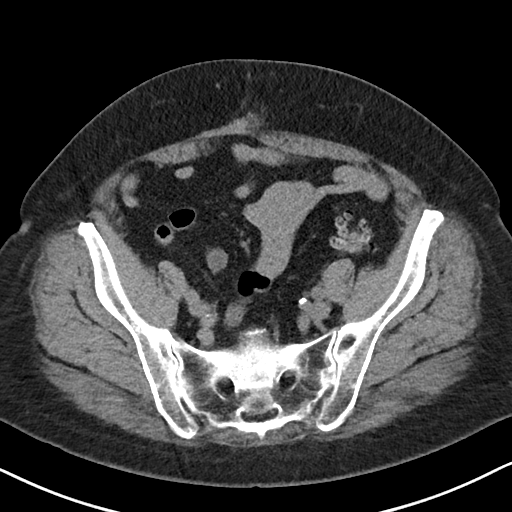
[im 45/89  soft-tissue]
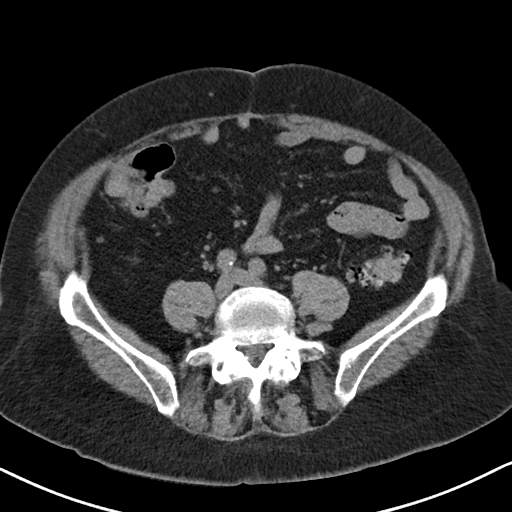
[im 53/89  soft-tissue]
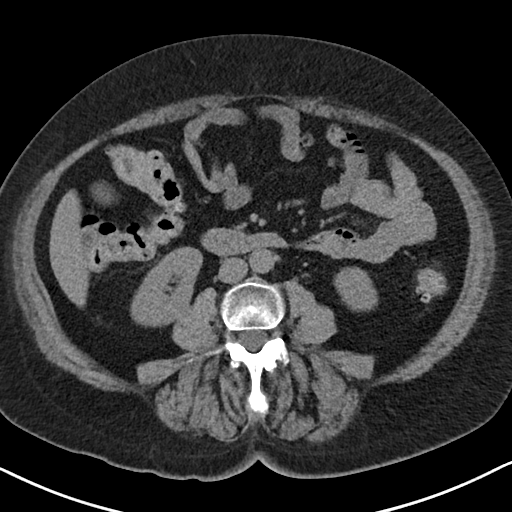
[im 53/89  lung]
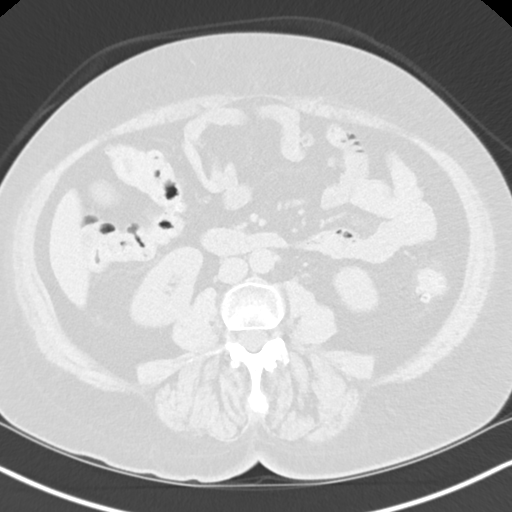
[im 62/89  soft-tissue]
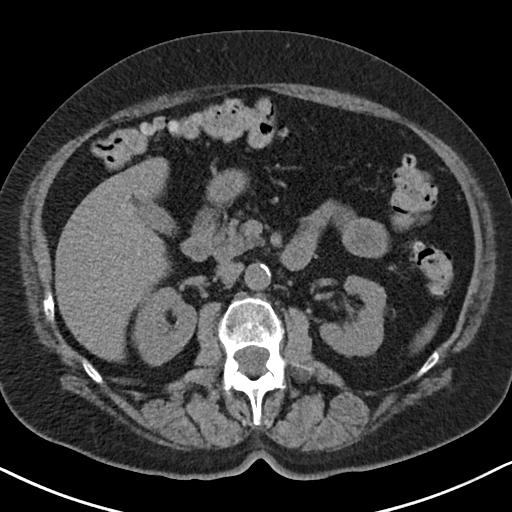
[im 62/89  lung]
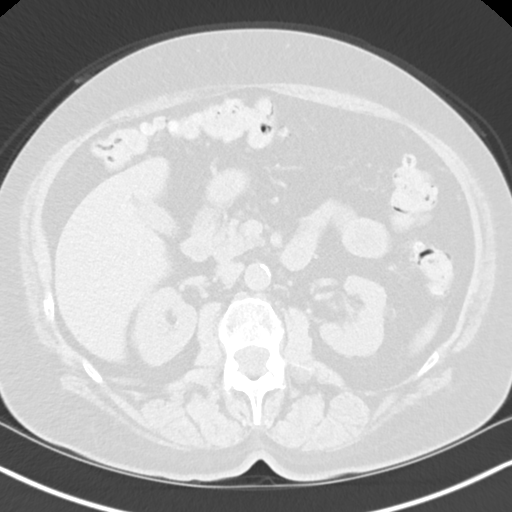
[im 71/89  soft-tissue]
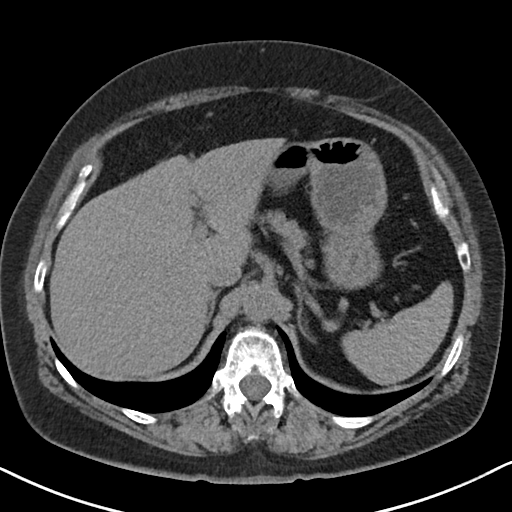
[im 71/89  lung]
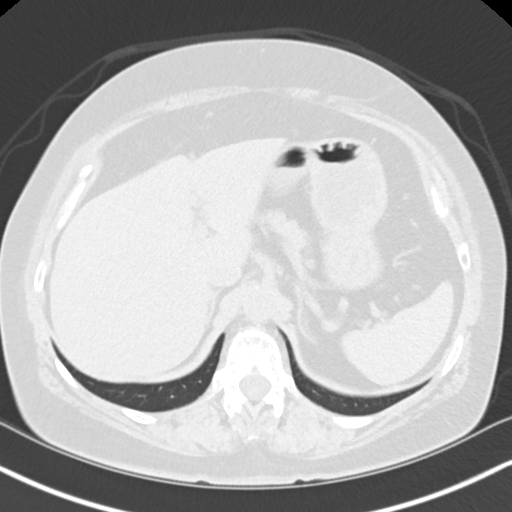
[im 80/89  soft-tissue]
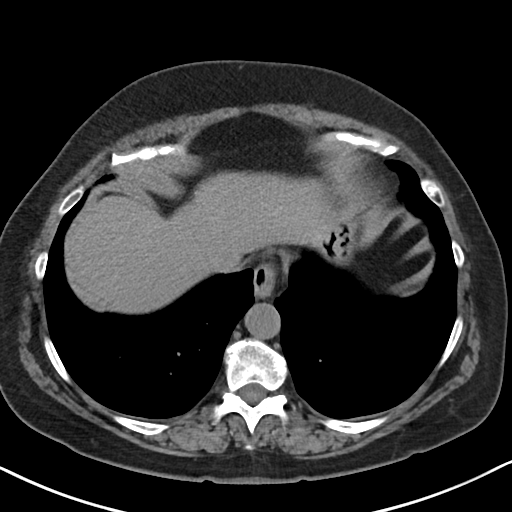
[im 80/89  lung]
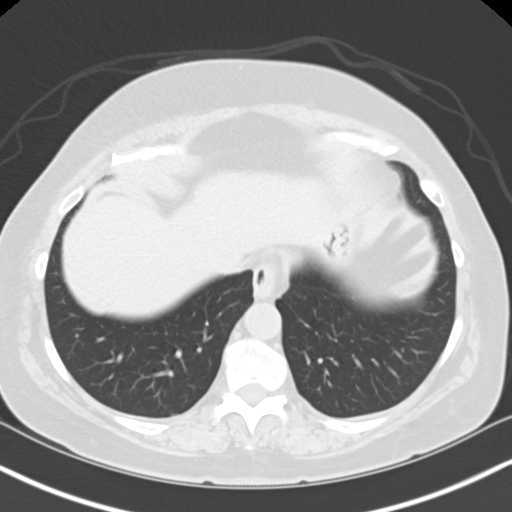
[im 80/89  bone]
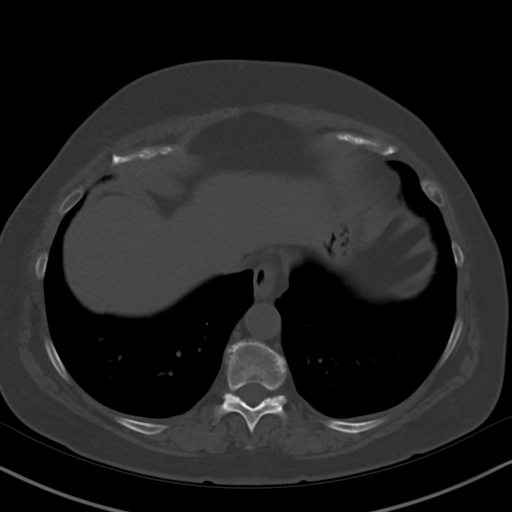

[Series 4: abd pelvis pre 2.00 cor · coronal · non-contrast · 0.67mm/px · 3 of 155 slices shown]
[im 39/155  soft-tissue]
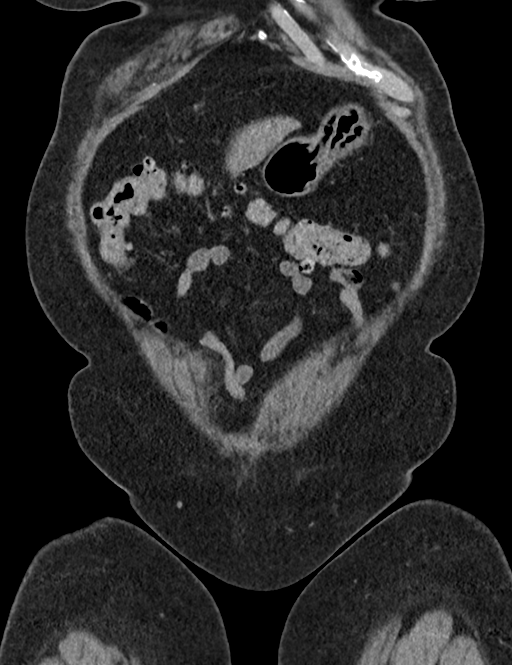
[im 78/155  soft-tissue]
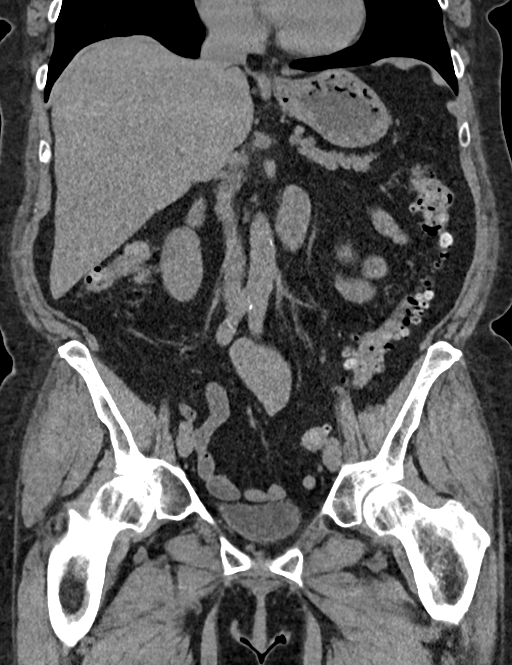
[im 116/155  soft-tissue]
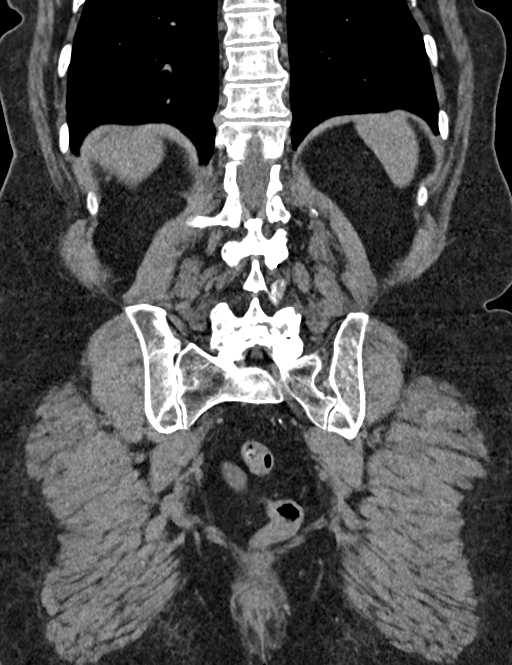

[12 of 46 positions shown; findings below may reference images not displayed]

FINDINGS: Lower chest: Lung bases are clear.

Hepatobiliary: No focal hepatic lesion. No biliary duct dilatation.
Common bile duct is normal.

Pancreas: Pancreas is normal. No ductal dilatation. No pancreatic
inflammation.

Spleen: Normal spleen

Adrenals/urinary tract: Adrenal glands and kidneys are normal. The
ureters and bladder normal.

Stomach/Bowel: Stomach, small-bowel and cecum are normal. The
appendix is not identified but there is no pericecal inflammation to
suggest appendicitis. Multiple diverticula of the descending colon
and sigmoid colon without acute inflammation. Colonic colonic
anastomosis at the proximal sigmoid colon. No complication. Rectum
normal

Vascular/Lymphatic: Abdominal aorta is normal caliber. No periportal
or retroperitoneal adenopathy. No pelvic adenopathy.

Reproductive: Post hysterectomy.  Adnexa unremarkable

Other: No free fluid.

Musculoskeletal: No aggressive osseous lesion.
IMPRESSION: 1. No acute abdominopelvic findings.
2. LEFT colon diverticulosis without evidence diverticulitis.
3. No obstructive uropathy.  Bladder normal.

## 2022-05-07 DIAGNOSIS — M25562 Pain in left knee: Secondary | ICD-10-CM | POA: Diagnosis not present

## 2022-06-14 ENCOUNTER — Telehealth: Payer: Self-pay | Admitting: Family Medicine

## 2022-06-14 NOTE — Telephone Encounter (Signed)
Spoke with patient. She has been feeling bad for 2 weeks now and has tried over the counter medications. Informed patient we don't have any appointments available at this point in the day. Suggested urgent care if wanting to be seen before the weekend, patient does not want to go to urgent care. Patient only wanted to see Dr Darnell Level, scheduled appt. on 10/10.

## 2022-06-14 NOTE — Telephone Encounter (Signed)
Pt called requesting advice on what to do about sinus infection. Pt stated she has a bad cough, throat is backed up & had head pressure. Pt stated she tried treating sinuses herself but nothing is seeming to work, feels as if she's feeling worse. Call back # [phone]

## 2022-06-18 ENCOUNTER — Encounter: Payer: Self-pay | Admitting: Family Medicine

## 2022-06-18 ENCOUNTER — Ambulatory Visit (INDEPENDENT_AMBULATORY_CARE_PROVIDER_SITE_OTHER): Payer: Medicare Other | Admitting: Family Medicine

## 2022-06-18 DIAGNOSIS — J0110 Acute frontal sinusitis, unspecified: Secondary | ICD-10-CM

## 2022-06-18 MED ORDER — CHERATUSSIN AC 100-10 MG/5ML PO SOLN: 5.0000 mL | Freq: Three times a day (TID) | ORAL | 0 refills | Status: DC | PRN

## 2022-06-18 MED ORDER — AMOXICILLIN-POT CLAVULANATE 875-125 MG PO TABS: 1.0000 | ORAL_TABLET | Freq: Two times a day (BID) | ORAL | 0 refills | Status: AC

## 2022-06-18 MED ORDER — FLUTICASONE PROPIONATE 50 MCG/ACT NA SUSP: NASAL | 3 refills | Status: DC

## 2022-06-18 NOTE — Progress Notes (Signed)
Patient ID: Brittany [name], female    DOB: 11/29/1955, 66 y.o.   MRN: [id number]  This visit was conducted in person.  BP 124/84 (BP Location: Left Arm, Patient Position: Sitting, Cuff Size: Large)   Pulse 72   Temp 97.6 F (36.4 C)   Ht 5' 3"  (1.6 m)   Wt 176 lb (79.8 kg)   SpO2 100%   BMI 31.18 kg/m    CC: URI symptoms  Subjective:   HPI: Brittany [name] is a 66 y.o. female presenting on 06/18/2022 for URI (Started 3 weeks ago with post nasal drip, cough, laryngitis. Thought it was allergies. Last week had headaches, sinus pressure. Now still has post nasal trip, cough. )   3 wk h/o PNdrainage, cough, laryngitis, initially also with headache and sinus pressure. 4d ago had worsened frontal sinus pressure, ongoing coughing fits worse at night time dry cough but feels sputum in throat. This week loose stools started. Initial chills.   Initially thought allergic vs viral but symptoms haven't improved. She's been taking dayquil, benadryl, nyquil, and flonase. She's tried OTC cough remedy as well as cough drops.   No fevers, ear or tooth pain, dyspnea or wheezing.   No sick contacts at home.  No h/o asthma.   No recent antibiotic use.      Relevant past medical, surgical, family and social history reviewed and updated as indicated. Interim medical history since our last visit reviewed. Allergies and medications reviewed and updated. Outpatient Medications Prior to Visit  Medication Sig Dispense Refill   Ascorbic Acid (VITAMIN C) 1000 MG tablet Take 1,000 mg by mouth daily.     atorvastatin (LIPITOR) 20 MG tablet TAKE 1 TABLET BY MOUTH EVERY DAY 90 tablet 3   Cholecalciferol (VITAMIN D3) 25 MCG (1000 UT) CAPS Take 1 capsule (1,000 Units total) by mouth daily. 30 capsule    Cranberry 500 MG CAPS Take by mouth.     estradiol (ESTRACE) 0.1 MG/GM vaginal cream Estrogen Cream Instruction Discard applicator Apply pea sized amount to tip of finger to urethra before bed. Wash hands  well after application. Use Monday, Wednesday and Friday 42.5 g 12   gabapentin (NEURONTIN) 600 MG tablet 600 mg at bedtime.     ibuprofen (ADVIL,MOTRIN) 200 MG tablet Take 600 mg by mouth as needed.     levothyroxine (SYNTHROID) 100 MCG tablet TAKE 1 TABLET BY MOUTH DAILY  BEFORE BREAKFAST 90 tablet 3   losartan (COZAAR) 50 MG tablet TAKE 1 TABLET BY MOUTH DAILY 90 tablet 3   Magnesium 200 MG TABS Take 1 tablet (200 mg total) by mouth daily.     Multiple Vitamins-Minerals (ALIVE WOMENS ENERGY PO) Take 1 tablet by mouth daily.     Omega-3 Fatty Acids (FISH OIL PO) Take by mouth daily. Takes 2,400 mg     omeprazole (PRILOSEC) 40 MG capsule TAKE 1 CAPSULE BY MOUTH DAILY 90 capsule 3   QUICKVUE AT-HOME COVID-19 TEST KIT See admin instructions.     venlafaxine XR (EFFEXOR-XR) 37.5 MG 24 hr capsule Take 1 capsule (37.5 mg total) by mouth daily with breakfast. 90 capsule 0   vitamin B-12 (V-R VITAMIN B-12) 500 MCG tablet Take 1 tablet (500 mcg total) by mouth every Monday, Wednesday, and Friday.     zinc gluconate 50 MG tablet Take 1 tablet (50 mg total) by mouth daily.     fluticasone (FLONASE) 50 MCG/ACT nasal spray SPRAY 2 SPRAYS INTO EACH NOSTRIL EVERY DAY 16 mL  8   No facility-administered medications prior to visit.     Per HPI unless specifically indicated in ROS section below Review of Systems  Objective:  BP 124/84 (BP Location: Left Arm, Patient Position: Sitting, Cuff Size: Large)   Pulse 72   Temp 97.6 F (36.4 C)   Ht 5' 3"  (1.6 m)   Wt 176 lb (79.8 kg)   SpO2 100%   BMI 31.18 kg/m   Wt Readings from Last 3 Encounters:  06/18/22 176 lb (79.8 kg)  01/21/22 177 lb 6.4 oz (80.5 kg)  12/10/21 176 lb (79.8 kg)      Physical Exam Vitals and nursing note reviewed.  Constitutional:      Appearance: Normal appearance. She is not ill-appearing.  HENT:     Head: Normocephalic and atraumatic.     Right Ear: Tympanic membrane, ear canal and external ear normal. There is no  impacted cerumen.     Left Ear: Tympanic membrane, ear canal and external ear normal. There is no impacted cerumen.     Nose: Nose normal. No congestion or rhinorrhea.     Right Turbinates: Not enlarged, swollen or pale.     Left Turbinates: Not enlarged, swollen or pale.     Right Sinus: No maxillary sinus tenderness or frontal sinus tenderness.     Left Sinus: No maxillary sinus tenderness or frontal sinus tenderness.     Mouth/Throat:     Mouth: Mucous membranes are moist.     Pharynx: Oropharynx is clear. No oropharyngeal exudate or posterior oropharyngeal erythema.  Eyes:     Extraocular Movements: Extraocular movements intact.     Conjunctiva/sclera: Conjunctivae normal.     Pupils: Pupils are equal, round, and reactive to light.  Cardiovascular:     Rate and Rhythm: Normal rate and regular rhythm.     Pulses: Normal pulses.     Heart sounds: Normal heart sounds. No murmur heard.    Comments: Lungs clear Pulmonary:     Effort: Pulmonary effort is normal. No respiratory distress.     Breath sounds: Normal breath sounds. No wheezing, rhonchi or rales.  Lymphadenopathy:     Head:     Right side of head: No submental, submandibular, tonsillar, preauricular or posterior auricular adenopathy.     Left side of head: No submental, submandibular, tonsillar, preauricular or posterior auricular adenopathy.     Cervical: No cervical adenopathy.     Right cervical: No superficial cervical adenopathy.    Left cervical: No superficial cervical adenopathy.     Upper Body:     Right upper body: No supraclavicular adenopathy.     Left upper body: No supraclavicular adenopathy.  Skin:    Findings: No rash.  Neurological:     Mental Status: She is alert.  Psychiatric:        Mood and Affect: Mood normal.        Behavior: Behavior normal.        Assessment & Plan:   Problem List Items Addressed This Visit     Acute non-recurrent sinusitis    Anticipate acute bacterial sinusitis with  bronchitis given duration and progression of symptoms. Rx augmentin 7d course and cheratussin cough syrup. Supportive measures reviewed. Update if not improving with treatment. Pt agrees with plan.       Relevant Medications   fluticasone (FLONASE) 50 MCG/ACT nasal spray   amoxicillin-clavulanate (AUGMENTIN) 875-125 MG tablet   guaiFENesin-codeine (CHERATUSSIN AC) 100-10 MG/5ML syrup     Meds ordered  this encounter  Medications   DISCONTD: fluticasone (FLONASE) 50 MCG/ACT nasal spray    Sig: SPRAY 2 SPRAYS INTO EACH NOSTRIL EVERY DAY    Dispense:  48 mL    Refill:  3   fluticasone (FLONASE) 50 MCG/ACT nasal spray    Sig: SPRAY 2 SPRAYS INTO EACH NOSTRIL EVERY DAY    Dispense:  48 mL    Refill:  3   amoxicillin-clavulanate (AUGMENTIN) 875-125 MG tablet    Sig: Take 1 tablet by mouth 2 (two) times daily for 7 days.    Dispense:  14 tablet    Refill:  0   guaiFENesin-codeine (CHERATUSSIN AC) 100-10 MG/5ML syrup    Sig: Take 5 mLs by mouth 3 (three) times daily as needed for cough.    Dispense:  120 mL    Refill:  0   No orders of the defined types were placed in this encounter.    Patient Instructions  You have a sinus infection along with bronchitis. Take medicine as prescribed: augmentin antibiotic for 7 days.  May use cheratussin cough syrup as needed.  Push fluids and plenty of rest. Nasal saline irrigation or neti pot to help drain sinuses. May use plain mucinex with plenty of fluid to help mobilize mucous. Please let us know if fever >101.5, trouble opening/closing mouth, difficulty swallowing, or worsening instead of improving as expected.   Follow up plan: Return if symptoms worsen or fail to improve.  Ria Bush, MD

## 2022-06-18 NOTE — Assessment & Plan Note (Signed)
Anticipate acute bacterial sinusitis with bronchitis given duration and progression of symptoms. Rx augmentin 7d course and cheratussin cough syrup. Supportive measures reviewed. Update if not improving with treatment. Pt agrees with plan.

## 2022-06-18 NOTE — Patient Instructions (Addendum)
You have a sinus infection along with bronchitis. Take medicine as prescribed: augmentin antibiotic for 7 days.  May use cheratussin cough syrup as needed.  Push fluids and plenty of rest. Nasal saline irrigation or neti pot to help drain sinuses. May use plain mucinex with plenty of fluid to help mobilize mucous. Please let us know if fever >101.5, trouble opening/closing mouth, difficulty swallowing, or worsening instead of improving as expected.

## 2022-06-20 ENCOUNTER — Other Ambulatory Visit: Payer: Self-pay | Admitting: Family Medicine

## 2022-06-29 ENCOUNTER — Encounter: Payer: Self-pay | Admitting: Family Medicine

## 2022-07-03 MED ORDER — AMOXICILLIN-POT CLAVULANATE 875-125 MG PO TABS: 1.0000 | ORAL_TABLET | Freq: Two times a day (BID) | ORAL | 0 refills | Status: AC

## 2022-07-11 ENCOUNTER — Encounter (INDEPENDENT_AMBULATORY_CARE_PROVIDER_SITE_OTHER): Payer: Medicare Other | Admitting: Family Medicine

## 2022-07-12 DIAGNOSIS — U071 COVID-19: Secondary | ICD-10-CM | POA: Diagnosis not present

## 2022-07-12 MED ORDER — MOLNUPIRAVIR EUA 200MG CAPSULE: 4.0000 | ORAL_CAPSULE | Freq: Two times a day (BID) | ORAL | 0 refills | Status: AC

## 2022-07-12 NOTE — Telephone Encounter (Signed)
Called patient.   First day of symptoms: 07/11/2022 Tested COVID positive: 07/11/2022  Current symptoms: fatigue, mental fog, head congestion, productive cough No: fever/chill, dyspnea, abd pain or nausea/vomiting/diarrhea Treatments to date: dayquil, nyquil Risk factors include: HTN, obesity, age  COVID vaccination status: x6  Recently completed 2 courses of augmentin for acute sinusitis.    Reviewed currently approved EUA treatments.  Reviewed expected course of illness, anticipated course of recovery, as well as red flags to suggest COVID pneumonia and/or to seek urgent in-person care.  Reviewed latest CDC isolation/quarantine guidelines.  Encouraged fluids and rest. Reviewed further supportive care measures at home including vit C '500mg'$  bid, vit D 2000 IU daily, zinc '100mg'$  daily, tylenol PRN, pepcid '20mg'$  BID PRN.   Recommend:  Rec molnupiravir course sent to pharmacy.   Please see the MyChart message reply(ies) for further information/assessment and plan.  The patient gave consent for this Medical Advice Message and is aware that it may result in a bill to their insurance company as well as the possibility that this may result in a co-payment or deductible. They are an established patient, but are not seeking medical advice exclusively about a problem treated during an in person or video visit in the last 7 days. I did not recommend an in person or video visit within 7 days of my reply.  I spent a total of 13 minutes cumulative time within 7 days through [facility] messaging Ria Bush, MD

## 2022-07-26 NOTE — Telephone Encounter (Signed)
   Brittany [name] - 66 y.o. female  MRN [id number]  Date of Birth: [birth year]  PCP: Ria Bush, MD  This service was provided via telemedicine. Phone Visit performed on 07/12/2022   Rationale for phone visit along with limitations reviewed. I discussed the limitations, risks, security and privacy concerns of performing a phone visit and the availability of in person appointments. I also discussed with the patient that there may be a patient responsible charge related to this service. Patient consented to telephone encounter.    Location of patient: home Location of provider: in office, [place] [facility] Name of referring provider: N/A   Names of persons and role in encounter: Provider: Ria Bush, MD  Patient: Brittany [name]  Other: N/A

## 2022-08-21 ENCOUNTER — Encounter: Payer: Self-pay | Admitting: Family Medicine

## 2022-09-06 ENCOUNTER — Other Ambulatory Visit: Payer: Self-pay | Admitting: Family Medicine

## 2022-09-06 DIAGNOSIS — I10 Essential (primary) hypertension: Secondary | ICD-10-CM

## 2022-09-09 ENCOUNTER — Other Ambulatory Visit: Payer: Self-pay | Admitting: Family Medicine

## 2022-09-09 DIAGNOSIS — E785 Hyperlipidemia, unspecified: Secondary | ICD-10-CM

## 2022-09-09 DIAGNOSIS — R7989 Other specified abnormal findings of blood chemistry: Secondary | ICD-10-CM

## 2022-09-09 DIAGNOSIS — E039 Hypothyroidism, unspecified: Secondary | ICD-10-CM

## 2022-09-13 ENCOUNTER — Other Ambulatory Visit (INDEPENDENT_AMBULATORY_CARE_PROVIDER_SITE_OTHER): Payer: Medicare Other

## 2022-09-13 DIAGNOSIS — E785 Hyperlipidemia, unspecified: Secondary | ICD-10-CM | POA: Diagnosis not present

## 2022-09-13 DIAGNOSIS — R7989 Other specified abnormal findings of blood chemistry: Secondary | ICD-10-CM | POA: Diagnosis not present

## 2022-09-13 DIAGNOSIS — E039 Hypothyroidism, unspecified: Secondary | ICD-10-CM

## 2022-09-13 LAB — LIPID PANEL
Cholesterol: 164 mg/dL (ref 0–200)
HDL: 50.9 mg/dL (ref >39.00)
LDL Cholesterol: 77 mg/dL (ref 0–99)
NonHDL: 113.51
Total CHOL/HDL Ratio: 3
Triglycerides: 185 mg/dL — ABNORMAL HIGH (ref 0.0–149.0)
VLDL: 37 mg/dL (ref 0.0–40.0)

## 2022-09-13 LAB — CBC WITH DIFFERENTIAL/PLATELET
Basophils Absolute: 0.1 10*3/uL (ref 0.0–0.1)
Basophils Relative: 0.7 % (ref 0.0–3.0)
Eosinophils Absolute: 0.2 10*3/uL (ref 0.0–0.7)
Eosinophils Relative: 2.7 % (ref 0.0–5.0)
HCT: 41.2 % (ref 36.0–46.0)
Hemoglobin: 13.6 g/dL (ref 12.0–15.0)
Lymphocytes Relative: 33.9 % (ref 12.0–46.0)
Lymphs Abs: 2.4 10*3/uL (ref 0.7–4.0)
MCHC: 33 g/dL (ref 30.0–36.0)
MCV: 92.8 fl (ref 78.0–100.0)
Monocytes Absolute: 0.4 10*3/uL (ref 0.1–1.0)
Monocytes Relative: 6.2 % (ref 3.0–12.0)
Neutro Abs: 4 10*3/uL (ref 1.4–7.7)
Neutrophils Relative %: 56.5 % (ref 43.0–77.0)
Platelets: 248 10*3/uL (ref 150.0–400.0)
RBC: 4.44 Mil/uL (ref 3.87–5.11)
RDW: 13.4 % (ref 11.5–15.5)
WBC: 7 10*3/uL (ref 4.0–10.5)

## 2022-09-13 LAB — COMPREHENSIVE METABOLIC PANEL
ALT: 8 U/L (ref 0–35)
AST: 17 U/L (ref 0–37)
Albumin: 4.3 g/dL (ref 3.5–5.2)
Alkaline Phosphatase: 87 U/L (ref 39–117)
BUN: 9 mg/dL (ref 6–23)
CO2: 28 mEq/L (ref 19–32)
Calcium: 9.2 mg/dL (ref 8.4–10.5)
Chloride: 102 mEq/L (ref 96–112)
Creatinine, Ser: 0.85 mg/dL (ref 0.40–1.20)
GFR: 70.83 mL/min (ref >60.00)
Glucose, Bld: 97 mg/dL (ref 70–99)
Potassium: 4.3 mEq/L (ref 3.5–5.1)
Sodium: 139 mEq/L (ref 135–145)
Total Bilirubin: 0.6 mg/dL (ref 0.2–1.2)
Total Protein: 6.8 g/dL (ref 6.0–8.3)

## 2022-09-13 LAB — TSH: TSH: 0.37 u[IU]/mL (ref 0.35–5.50)

## 2022-09-13 LAB — VITAMIN B12: Vitamin B-12: 417 pg/mL (ref 211–911)

## 2022-09-20 ENCOUNTER — Encounter: Payer: Self-pay | Admitting: Family Medicine

## 2022-09-20 ENCOUNTER — Ambulatory Visit (INDEPENDENT_AMBULATORY_CARE_PROVIDER_SITE_OTHER): Payer: Medicare Other | Admitting: Family Medicine

## 2022-09-20 VITALS — BP 138/70 | HR 84 | Temp 97.4°F | Ht 63.0 in | Wt 175.1 lb

## 2022-09-20 DIAGNOSIS — S32010S Wedge compression fracture of first lumbar vertebra, sequela: Secondary | ICD-10-CM

## 2022-09-20 DIAGNOSIS — E039 Hypothyroidism, unspecified: Secondary | ICD-10-CM

## 2022-09-20 DIAGNOSIS — Z23 Encounter for immunization: Secondary | ICD-10-CM | POA: Diagnosis not present

## 2022-09-20 DIAGNOSIS — M545 Low back pain, unspecified: Secondary | ICD-10-CM

## 2022-09-20 DIAGNOSIS — J0191 Acute recurrent sinusitis, unspecified: Secondary | ICD-10-CM

## 2022-09-20 DIAGNOSIS — M810 Age-related osteoporosis without current pathological fracture: Secondary | ICD-10-CM | POA: Insufficient documentation

## 2022-09-20 DIAGNOSIS — R232 Flushing: Secondary | ICD-10-CM

## 2022-09-20 DIAGNOSIS — G8929 Other chronic pain: Secondary | ICD-10-CM

## 2022-09-20 DIAGNOSIS — Z7189 Other specified counseling: Secondary | ICD-10-CM

## 2022-09-20 DIAGNOSIS — E66811 Obesity, class 1: Secondary | ICD-10-CM

## 2022-09-20 DIAGNOSIS — R7989 Other specified abnormal findings of blood chemistry: Secondary | ICD-10-CM

## 2022-09-20 DIAGNOSIS — I10 Essential (primary) hypertension: Secondary | ICD-10-CM

## 2022-09-20 DIAGNOSIS — E669 Obesity, unspecified: Secondary | ICD-10-CM

## 2022-09-20 DIAGNOSIS — E785 Hyperlipidemia, unspecified: Secondary | ICD-10-CM

## 2022-09-20 DIAGNOSIS — Z0000 Encounter for general adult medical examination without abnormal findings: Secondary | ICD-10-CM

## 2022-09-20 DIAGNOSIS — K219 Gastro-esophageal reflux disease without esophagitis: Secondary | ICD-10-CM

## 2022-09-20 MED ORDER — CYANOCOBALAMIN 500 MCG PO TABS: 500.0000 ug | ORAL_TABLET | Freq: Every day | ORAL | Status: DC

## 2022-09-20 MED ORDER — VENLAFAXINE HCL ER 37.5 MG PO CP24: 37.5000 mg | ORAL_CAPSULE | Freq: Every day | ORAL | 4 refills | Status: DC

## 2022-09-20 MED ORDER — ATORVASTATIN CALCIUM 20 MG PO TABS: 20.0000 mg | ORAL_TABLET | Freq: Every day | ORAL | 4 refills | Status: DC

## 2022-09-20 MED ORDER — LEVOTHYROXINE SODIUM 100 MCG PO TABS: 100.0000 ug | ORAL_TABLET | Freq: Every day | ORAL | 4 refills | Status: DC

## 2022-09-20 MED ORDER — MONTELUKAST SODIUM 10 MG PO TABS: 10.0000 mg | ORAL_TABLET | Freq: Every day | ORAL | 3 refills | Status: DC

## 2022-09-20 MED ORDER — LOSARTAN POTASSIUM 50 MG PO TABS: 50.0000 mg | ORAL_TABLET | Freq: Every day | ORAL | 4 refills | Status: DC

## 2022-09-20 MED ORDER — GABAPENTIN 600 MG PO TABS: 600.0000 mg | ORAL_TABLET | Freq: Every day | ORAL | 4 refills | Status: DC

## 2022-09-20 MED ORDER — AMOXICILLIN-POT CLAVULANATE 875-125 MG PO TABS: 1.0000 | ORAL_TABLET | Freq: Two times a day (BID) | ORAL | 0 refills | Status: AC

## 2022-09-20 MED ORDER — CYANOCOBALAMIN 500 MCG PO TABS: 500.0000 ug | ORAL_TABLET | Freq: Every day | ORAL | Status: AC

## 2022-09-20 MED ORDER — OMEPRAZOLE 40 MG PO CPDR: 40.0000 mg | DELAYED_RELEASE_CAPSULE | Freq: Every day | ORAL | 4 refills | Status: DC

## 2022-09-20 NOTE — Assessment & Plan Note (Signed)
Continue to encourage healthy diet and lifestyle choices to affect sustainable weight loss.  °

## 2022-09-20 NOTE — Progress Notes (Signed)
Patient ID: Brittany [name], female    DOB: 06-May-1956, 67 y.o.   MRN: [id number]  This visit was conducted in person.  BP 138/70   Pulse 84   Temp (!) 97.4 F (36.3 C) (Temporal)   Ht '5\' 3"'$  (1.6 m)   Wt 175 lb 2 oz (79.4 kg)   SpO2 98%   BMI 31.02 kg/m    CC: AMW/CPE Subjective:   HPI: Brittany [name] is a 67 y.o. female presenting on 09/20/2022 for Medicare Wellness   Did not see health advisor this year.   Hearing Screening   '500Hz'$  '1000Hz'$  '2000Hz'$  '4000Hz'$   Right ear '20 20 20 20  '$ Left ear '20 20 20 20   '$ Vision Screening   Right eye Left eye Both eyes  Without correction     With correction '20/25 20/25 20/25 '$    Flowsheet Row Office Visit from 09/20/2022 in [facility] at [facility]  PHQ-2 Total Score 2          09/20/2022    8:30 AM 09/18/2021   10:02 AM 06/13/2020   12:08 PM 03/04/2019    9:28 AM 02/12/2018   11:15 AM  Fall Risk   Falls in the past year? 0 0 0 0 No  Number falls in past yr:   0    Injury with Fall?   0    Risk for fall due to :   Medication side effect    Follow up   Falls evaluation completed;Falls prevention discussed     Did have sinusitis then bronchitis then COVID late last year.  Treated for COVID with molnupiravir 07/2022.  Continued frontal pressure, PNDrainage, cough and congestion and stuffy ears. Some ST, no fevers/chills, ear pain, tooth pain. R nose more congested Codeine cough syrup didn't help.  Husband also sick at home.  This is despite flonase and benadryl.   Gabapentin '600mg'$  nightly for back pain and effexor XR 37.'5mg'$  nightly for hot flashes - previously filled by Dr Nelva Bush but not seeing regularly - requests we take over prescription.   Preventative: COLONOSCOPY Date: 03/2012 partial colectomy (h/o diverticulitis), scattered diverticulae [name], rpt 10 yrs  Colonoscopy 12/2020 - diverticulosis, hem, rpt 10 yrs ([facility])  Well woman - s/p hysterectomy (benign reason). Doesn't need rpt pap. Ovaries removed  (2010).  Mammogram 03/2022 - BiRads1 @ [facility]  DEXA scan 2013 - mild osteopenia T-1.3 at femoral neck, compression fracture L1 while bending over in shower (fragility fracture).  Lung cancer screening - not eligible Flu shot - yearly [place] 11/2019, 12/2019, booster 06/2020, 12/2020, bivalent 05/2021, 05/2022 RSV - planning to get  Tdap - 09/2011 Prevnar-20 - get today  zostavax - 06/2016 shingrix - 08/2022, states had 2nd - will send [place] date  Advanced directive: scanned 11/2021. Husband Brittany [name] then son Brittany [name] are [place]. [place] with organ donation. Doesn't want prolonged life support if terminal condition. Doesn't think would want tube feed. Wants living will followed.  Seat belt use discussed Sunscreen use discussed. No changing moles on skin.  Non smoker Alcohol - occasional beer on weekends  Dentist - due. Has focused on husband's dental health this year.  Eye exam yearly Bowel - no constipation  Bladder - rare urge incontinence    Caffeine: 2-3 cups/day (1-2 coffee)  Lives with husband and 55yo son, 4 dogs  Activity: yardwork, limited by back pain Diet: good water, fruits/vegetable daily      Relevant past medical, surgical, family and  social history reviewed and updated as indicated. Interim medical history since our last visit reviewed. Allergies and medications reviewed and updated. Outpatient Medications Prior to Visit  Medication Sig Dispense Refill   Ascorbic Acid (VITAMIN C) 1000 MG tablet Take 1,000 mg by mouth daily.     Cholecalciferol (VITAMIN D3) 25 MCG (1000 UT) CAPS Take 1 capsule (1,000 Units total) by mouth daily. 30 capsule    Cranberry 500 MG CAPS Take by mouth.     estradiol (ESTRACE) 0.1 MG/GM vaginal cream Estrogen Cream Instruction Discard applicator Apply pea sized amount to tip of finger to urethra before bed. Wash hands well after application. Use Monday, Wednesday and Friday 42.5 g 12   fluticasone (FLONASE) 50 MCG/ACT nasal spray SPRAY 2  SPRAYS INTO EACH NOSTRIL EVERY DAY 48 mL 3   ibuprofen (ADVIL,MOTRIN) 200 MG tablet Take 600 mg by mouth as needed.     Magnesium 200 MG TABS Take 1 tablet (200 mg total) by mouth daily.     Multiple Vitamins-Minerals (ALIVE WOMENS ENERGY PO) Take 1 tablet by mouth daily.     Omega-3 Fatty Acids (FISH OIL PO) Take by mouth daily. Takes 2,400 mg     QUICKVUE AT-HOME COVID-19 TEST KIT See admin instructions.     zinc gluconate 50 MG tablet Take 1 tablet (50 mg total) by mouth daily.     atorvastatin (LIPITOR) 20 MG tablet TAKE 1 TABLET BY MOUTH EVERY DAY 90 tablet 3   gabapentin (NEURONTIN) 600 MG tablet 600 mg at bedtime.     levothyroxine (SYNTHROID) 100 MCG tablet TAKE 1 TABLET BY MOUTH DAILY  BEFORE BREAKFAST 90 tablet 3   losartan (COZAAR) 50 MG tablet TAKE 1 TABLET BY MOUTH DAILY 90 tablet 0   omeprazole (PRILOSEC) 40 MG capsule TAKE 1 CAPSULE BY MOUTH DAILY 90 capsule 3   venlafaxine XR (EFFEXOR-XR) 37.5 MG 24 hr capsule TAKE 1 CAPSULE BY MOUTH DAILY  WITH BREAKFAST 90 capsule 0   vitamin B-12 (V-R VITAMIN B-12) 500 MCG tablet Take 1 tablet (500 mcg total) by mouth every Monday, Wednesday, and Friday.     guaiFENesin-codeine (CHERATUSSIN AC) 100-10 MG/5ML syrup Take 5 mLs by mouth 3 (three) times daily as needed for cough. 120 mL 0   No facility-administered medications prior to visit.     Per HPI unless specifically indicated in ROS section below Review of Systems  Constitutional:  Negative for activity change, appetite change, chills, fatigue, fever and unexpected weight change.  HENT:  Positive for sinus pressure and sinus pain. Negative for hearing loss.   Eyes:  Negative for visual disturbance.  Respiratory:  Positive for cough and shortness of breath (exertional). Negative for chest tightness and wheezing.   Cardiovascular:  Negative for chest pain, palpitations and leg swelling.  Gastrointestinal:  Negative for abdominal distention, abdominal pain, blood in stool, constipation,  diarrhea, nausea and vomiting.  Genitourinary:  Negative for difficulty urinating and hematuria.  Musculoskeletal:  Negative for arthralgias, myalgias and neck pain.  Skin:  Negative for rash.  Neurological:  Positive for headaches. Negative for dizziness, seizures and syncope.  Hematological:  Negative for adenopathy. Does not bruise/bleed easily.  Psychiatric/Behavioral:  Negative for dysphoric mood. The patient is not nervous/anxious.     Objective:  BP 138/70   Pulse 84   Temp (!) 97.4 F (36.3 C) (Temporal)   Ht '5\' 3"'$  (1.6 m)   Wt 175 lb 2 oz (79.4 kg)   SpO2 98%   BMI 31.02  kg/m   Wt Readings from Last 3 Encounters:  09/20/22 175 lb 2 oz (79.4 kg)  06/18/22 176 lb (79.8 kg)  01/21/22 177 lb 6.4 oz (80.5 kg)      Physical Exam Vitals and nursing note reviewed.  Constitutional:      Appearance: Normal appearance. She is not ill-appearing.  HENT:     Head: Normocephalic and atraumatic.     Right Ear: Tympanic membrane, ear canal and external ear normal. There is no impacted cerumen.     Left Ear: Tympanic membrane, ear canal and external ear normal. There is no impacted cerumen.     Nose: Mucosal edema and congestion present.     Right Turbinates: Not enlarged, swollen or pale.     Left Turbinates: Not enlarged, swollen or pale.     Right Sinus: Frontal sinus tenderness present. No maxillary sinus tenderness.     Left Sinus: Frontal sinus tenderness present. No maxillary sinus tenderness.     Mouth/Throat:     Mouth: Mucous membranes are moist.     Pharynx: Oropharynx is clear. No oropharyngeal exudate or posterior oropharyngeal erythema.  Eyes:     General:        Right eye: No discharge.        Left eye: No discharge.     Extraocular Movements: Extraocular movements intact.     Conjunctiva/sclera: Conjunctivae normal.     Pupils: Pupils are equal, round, and reactive to light.  Neck:     Thyroid: No thyroid mass or thyromegaly.     Vascular: No carotid bruit.   Cardiovascular:     Rate and Rhythm: Normal rate and regular rhythm.     Pulses: Normal pulses.     Heart sounds: Normal heart sounds. No murmur heard. Pulmonary:     Effort: Pulmonary effort is normal. No respiratory distress.     Breath sounds: Normal breath sounds. No wheezing, rhonchi or rales.  Abdominal:     General: Bowel sounds are normal. There is no distension.     Palpations: Abdomen is soft. There is no mass.     Tenderness: There is no abdominal tenderness. There is no guarding or rebound.     Hernia: No hernia is present.  Musculoskeletal:     Cervical back: Normal range of motion and neck supple. No rigidity.     Right lower leg: No edema.     Left lower leg: No edema.  Lymphadenopathy:     Cervical: No cervical adenopathy.  Skin:    General: Skin is warm and dry.     Findings: No rash.  Neurological:     General: No focal deficit present.     Mental Status: She is alert. Mental status is at baseline.     Comments:  Recall 3/3 Calculation 5/5 DLROW  Psychiatric:        Mood and Affect: Mood normal.        Behavior: Behavior normal.       Results for orders placed or performed in visit on 09/13/22  CBC with Differential/Platelet  Result Value Ref Range   WBC 7.0 4.0 - 10.5 K/uL   RBC 4.44 3.87 - 5.11 Mil/uL   Hemoglobin 13.6 12.0 - 15.0 g/dL   HCT 41.2 36.0 - 46.0 %   MCV 92.8 78.0 - 100.0 fl   MCHC 33.0 30.0 - 36.0 g/dL   RDW 13.4 11.5 - 15.5 %   Platelets 248.0 150.0 - 400.0 K/uL   Neutrophils  Relative % 56.5 43.0 - 77.0 %   Lymphocytes Relative 33.9 12.0 - 46.0 %   Monocytes Relative 6.2 3.0 - 12.0 %   Eosinophils Relative 2.7 0.0 - 5.0 %   Basophils Relative 0.7 0.0 - 3.0 %   Neutro Abs 4.0 1.4 - 7.7 K/uL   Lymphs Abs 2.4 0.7 - 4.0 K/uL   Monocytes Absolute 0.4 0.1 - 1.0 K/uL   Eosinophils Absolute 0.2 0.0 - 0.7 K/uL   Basophils Absolute 0.1 0.0 - 0.1 K/uL  TSH  Result Value Ref Range   TSH 0.37 0.35 - 5.50 uIU/mL  Comprehensive metabolic  panel  Result Value Ref Range   Sodium 139 135 - 145 mEq/L   Potassium 4.3 3.5 - 5.1 mEq/L   Chloride 102 96 - 112 mEq/L   CO2 28 19 - 32 mEq/L   Glucose, Bld 97 70 - 99 mg/dL   BUN 9 6 - 23 mg/dL   Creatinine, Ser 0.85 0.40 - 1.20 mg/dL   Total Bilirubin 0.6 0.2 - 1.2 mg/dL   Alkaline Phosphatase 87 39 - 117 U/L   AST 17 0 - 37 U/L   ALT 8 0 - 35 U/L   Total Protein 6.8 6.0 - 8.3 g/dL   Albumin 4.3 3.5 - 5.2 g/dL   GFR 70.83 >60.00 mL/min   Calcium 9.2 8.4 - 10.5 mg/dL  Lipid panel  Result Value Ref Range   Cholesterol 164 0 - 200 mg/dL   Triglycerides 185.0 (H) 0.0 - 149.0 mg/dL   HDL 50.90 >39.00 mg/dL   VLDL 37.0 0.0 - 40.0 mg/dL   LDL Cholesterol 77 0 - 99 mg/dL   Total CHOL/HDL Ratio 3    NonHDL 113.51   Vitamin B12  Result Value Ref Range   Vitamin B-12 417 211 - 911 pg/mL    Assessment & Plan:   Problem List Items Addressed This Visit     Essential hypertension (Chronic)    Chronic, stable on current regimen - continue      Relevant Medications   atorvastatin (LIPITOR) 20 MG tablet   losartan (COZAAR) 50 MG tablet   Hyperlipidemia with target LDL less than 100 (Chronic)    Chronic, stable period on atorvastatin '20mg'$  daily - continue. The 10-year ASCVD risk score (Arnett DK, et al., 2019) is: 10%   Values used to calculate the score:     Age: 84 years     Sex: Female     Is Non-Hispanic African American: No     Diabetic: No     Tobacco smoker: No     Systolic Blood Pressure: 449 mmHg     Is BP treated: Yes     HDL Cholesterol: 50.9 mg/dL     Total Cholesterol: 164 mg/dL       Relevant Medications   atorvastatin (LIPITOR) 20 MG tablet   losartan (COZAAR) 50 MG tablet   Medicare annual wellness visit, subsequent - Primary (Chronic)    I have personally reviewed the Medicare Annual Wellness questionnaire and have noted 1. The patient's medical and social history 2. Their use of alcohol, tobacco or illicit drugs 3. Their current medications and  supplements 4. The patient's functional ability including ADL's, fall risks, home safety risks and hearing or visual impairment. Cognitive function has been assessed and addressed as indicated.  5. Diet and physical activity 6. Evidence for depression or mood disorders The patients weight, height, BMI have been recorded in the chart. I have made referrals, counseling  and provided education to the patient based on review of the above and I have provided the pt with a written personalized care plan for preventive services. Provider list updated.. See scanned questionairre as needed for further documentation. Reviewed preventative protocols and updated unless pt declined.       Obesity, Class I, BMI 30-34.9 (Chronic)    Continue to encourage healthy diet and lifestyle choices to affect sustainable weight loss.       Advanced care planning/counseling discussion (Chronic)    Advanced directive: scanned 11/2021. Husband Brittany [name] then son Brittany [name] are [place]. [place] with organ donation. Doesn't want prolonged life support if terminal condition. Doesn't think would want tube feed. Wants living will followed.       Health maintenance examination (Chronic)    Preventative protocols reviewed and updated unless pt declined. Discussed healthy diet and lifestyle.       GERD (gastroesophageal reflux disease)    Continues omeprazole '40mg'$  daily.       Relevant Medications   omeprazole (PRILOSEC) 40 MG capsule   Hypothyroidism    Chronic, stable on levothyroxine 158mg daily.       Relevant Medications   levothyroxine (SYNTHROID) 100 MCG tablet   Hot flashes    Continue effexor XR 37.'5mg'$  daily along with gabapentin at night.       Relevant Medications   atorvastatin (LIPITOR) 20 MG tablet   losartan (COZAAR) 50 MG tablet   Compression fracture of L1 lumbar vertebra ([place]    H/o this 2013 likely diagnostic of osteoporosis. Will update DEXA as per above.       Acute recurrent sinusitis    Anticipate  combination of recurrent sinusitis and possible allergic component - will Rx augmentin 10d course and then start singulair '10mg'$  nightly x1 month, update with effect.  Discussed if ongoing, would consider ENT referral as this would be 3rd time treated for sinusitis in the past 2-3 months.       Relevant Medications   amoxicillin-clavulanate (AUGMENTIN) 875-125 MG tablet   Low vitamin B12 level    Increase vit b12 replacement to 5042m daily.       Chronic lumbar pain    Sees Dr RaNelva Bushnow on gabapentin '600mg'$  nightly       Relevant Medications   gabapentin (NEURONTIN) 600 MG tablet   venlafaxine XR (EFFEXOR-XR) 37.5 MG 24 hr capsule   Osteoporosis    H/o L1 fragility fracture 2013 - diagnostic of osteoporosis even though DEXA only showed mild osteopenia. Will repeat DEXA.  Discussed dietary calcium, vit D, regular weight bearing exercises.       Relevant Orders   DG Bone Density   Other Visit Diagnoses     Need for vaccination against Streptococcus pneumoniae       Relevant Orders   Pneumococcal conjugate vaccine 20-valent (Completed)        Meds ordered this encounter  Medications   gabapentin (NEURONTIN) 600 MG tablet    Sig: Take 1 tablet (600 mg total) by mouth at bedtime.    Dispense:  90 tablet    Refill:  4   DISCONTD: cyanocobalamin (V-R VITAMIN B-12) 500 MCG tablet    Sig: Take 1 tablet (500 mcg total) by mouth daily.   atorvastatin (LIPITOR) 20 MG tablet    Sig: Take 1 tablet (20 mg total) by mouth daily.    Dispense:  90 tablet    Refill:  4   levothyroxine (SYNTHROID) 100 MCG tablet    Sig: Take  1 tablet (100 mcg total) by mouth daily before breakfast.    Dispense:  90 tablet    Refill:  4   losartan (COZAAR) 50 MG tablet    Sig: Take 1 tablet (50 mg total) by mouth daily.    Dispense:  90 tablet    Refill:  4   omeprazole (PRILOSEC) 40 MG capsule    Sig: Take 1 capsule (40 mg total) by mouth daily.    Dispense:  90 capsule    Refill:  4    venlafaxine XR (EFFEXOR-XR) 37.5 MG 24 hr capsule    Sig: Take 1 capsule (37.5 mg total) by mouth daily with breakfast.    Dispense:  90 capsule    Refill:  4   cyanocobalamin (V-R VITAMIN B-12) 500 MCG tablet    Sig: Take 1 tablet (500 mcg total) by mouth daily.   montelukast (SINGULAIR) 10 MG tablet    Sig: Take 1 tablet (10 mg total) by mouth at bedtime.    Dispense:  30 tablet    Refill:  3   amoxicillin-clavulanate (AUGMENTIN) 875-125 MG tablet    Sig: Take 1 tablet by mouth 2 (two) times daily for 10 days.    Dispense:  20 tablet    Refill:  0    Orders Placed This Encounter  Procedures   DG Bone Density    Standing Status:   Future    Standing Expiration Date:   09/21/2023    Order Specific Question:   Reason for Exam (SYMPTOM  OR DIAGNOSIS REQUIRED)    Answer:   osteoporosis based on history L1 compression fracture    Order Specific Question:   Preferred imaging location?    Answer:   [facility]   Pneumococcal conjugate vaccine 20-valent    Patient Instructions  [id number] today.  Call to schedule bone density scan at your convenience: [place] [phone].  Work on dietary calcium intake, take vitamin D 1000 units daily, regular weight bearing exercise. If not getting '1200mg'$  of calcium in the diet, start taking calcium '600mg'$  supplement daily.  Send [place] dates of 2 shingrix shots.  Take augmentin 10 day course for possible persistent sinusitis, then start singulair '10mg'$  nightly. If ongoing sinus symptoms, we may refer you to ENT.  Return in 6 months for follow up visit.   Follow up plan: Return in about 6 months (around 03/21/2023), or if symptoms worsen or fail to improve, for follow up visit.  Ria Bush, MD

## 2022-09-20 NOTE — Assessment & Plan Note (Signed)
H/o this 2013 likely diagnostic of osteoporosis. Will update DEXA as per above.

## 2022-09-20 NOTE — Assessment & Plan Note (Signed)
H/o L1 fragility fracture 2013 - diagnostic of osteoporosis even though DEXA only showed mild osteopenia. Will repeat DEXA.  Discussed dietary calcium, vit D, regular weight bearing exercises.

## 2022-09-20 NOTE — Assessment & Plan Note (Signed)
Chronic, stable on current regimen - continue. 

## 2022-09-20 NOTE — Assessment & Plan Note (Signed)
Continue effexor XR 37.'5mg'$  daily along with gabapentin at night.

## 2022-09-20 NOTE — Assessment & Plan Note (Signed)
Continues omeprazole 40mg daily.  

## 2022-09-20 NOTE — Assessment & Plan Note (Signed)
Preventative protocols reviewed and updated unless pt declined. Discussed healthy diet and lifestyle.  

## 2022-09-20 NOTE — Assessment & Plan Note (Signed)
Chronic, stable on levothyroxine 118mg daily.

## 2022-09-20 NOTE — Assessment & Plan Note (Signed)

## 2022-09-20 NOTE — Assessment & Plan Note (Signed)
Sees Dr Nelva Bush, now on gabapentin '600mg'$  nightly

## 2022-09-20 NOTE — Patient Instructions (Addendum)
[id number] today.  Call to schedule bone density scan at your convenience: [place] [phone].  Work on dietary calcium intake, take vitamin D 1000 units daily, regular weight bearing exercise. If not getting '1200mg'$  of calcium in the diet, start taking calcium '600mg'$  supplement daily.  Send [place] dates of 2 shingrix shots.  Take augmentin 10 day course for possible persistent sinusitis, then start singulair '10mg'$  nightly. If ongoing sinus symptoms, we may refer you to ENT.  Return in 6 months for follow up visit.

## 2022-09-20 NOTE — Assessment & Plan Note (Signed)
Chronic, stable period on atorvastatin '20mg'$  daily - continue. The 10-year ASCVD risk score (Arnett DK, et al., 2019) is: 10%   Values used to calculate the score:     Age: 67 years     Sex: Female     Is Non-Hispanic African American: No     Diabetic: No     Tobacco smoker: No     Systolic Blood Pressure: 574 mmHg     Is BP treated: Yes     HDL Cholesterol: 50.9 mg/dL     Total Cholesterol: 164 mg/dL

## 2022-09-20 NOTE — Assessment & Plan Note (Signed)
Increase vit b12 replacement to 522mg daily.

## 2022-09-20 NOTE — Assessment & Plan Note (Signed)
Advanced directive: scanned 11/2021. Husband [name] then son Alroy Dust are [place]. [place] with organ donation. Doesn't want prolonged life support if terminal condition. Doesn't think would want tube feed. Wants living will followed.

## 2022-09-20 NOTE — Assessment & Plan Note (Addendum)
Anticipate combination of recurrent sinusitis and possible allergic component - will Rx augmentin 10d course and then start singulair '10mg'$  nightly x1 month, update with effect.  Discussed if ongoing, would consider ENT referral as this would be 3rd time treated for sinusitis in the past 2-3 months.

## 2022-09-26 ENCOUNTER — Ambulatory Visit: Payer: Medicare Other | Admitting: Urology

## 2022-10-08 DIAGNOSIS — M25561 Pain in right knee: Secondary | ICD-10-CM | POA: Diagnosis not present

## 2022-10-08 DIAGNOSIS — M25562 Pain in left knee: Secondary | ICD-10-CM | POA: Diagnosis not present

## 2022-10-22 ENCOUNTER — Ambulatory Visit
Admission: RE | Admit: 2022-10-22 | Discharge: 2022-10-22 | Disposition: A | Discharge status: Home / Self Care | Payer: Medicare Other | Source: Ambulatory Visit | Attending: Family Medicine | Admitting: Family Medicine

## 2022-10-22 DIAGNOSIS — Z780 Asymptomatic menopausal state: Secondary | ICD-10-CM | POA: Diagnosis not present

## 2022-10-22 DIAGNOSIS — M810 Age-related osteoporosis without current pathological fracture: Secondary | ICD-10-CM

## 2022-10-22 DIAGNOSIS — M85852 Other specified disorders of bone density and structure, left thigh: Secondary | ICD-10-CM | POA: Diagnosis not present

## 2022-10-25 ENCOUNTER — Other Ambulatory Visit: Payer: Self-pay | Admitting: Family Medicine

## 2022-10-25 ENCOUNTER — Encounter: Payer: Self-pay | Admitting: Family Medicine

## 2022-10-25 DIAGNOSIS — E785 Hyperlipidemia, unspecified: Secondary | ICD-10-CM

## 2022-12-06 ENCOUNTER — Other Ambulatory Visit: Payer: Self-pay | Admitting: Family Medicine

## 2022-12-06 DIAGNOSIS — Z1231 Encounter for screening mammogram for malignant neoplasm of breast: Secondary | ICD-10-CM

## 2022-12-24 ENCOUNTER — Encounter: Payer: Self-pay | Admitting: Internal Medicine

## 2022-12-24 ENCOUNTER — Ambulatory Visit (INDEPENDENT_AMBULATORY_CARE_PROVIDER_SITE_OTHER): Payer: Medicare Other | Admitting: Internal Medicine

## 2022-12-24 ENCOUNTER — Ambulatory Visit: Payer: Medicare Other | Admitting: Internal Medicine

## 2022-12-24 ENCOUNTER — Ambulatory Visit (INDEPENDENT_AMBULATORY_CARE_PROVIDER_SITE_OTHER)
Admission: RE | Admit: 2022-12-24 | Discharge: 2022-12-24 | Disposition: A | Discharge status: Home / Self Care | Payer: Medicare Other | Source: Ambulatory Visit | Attending: Internal Medicine | Admitting: Internal Medicine

## 2022-12-24 VITALS — BP 122/74 | HR 78 | Temp 97.4°F | Ht 63.0 in | Wt 174.0 lb

## 2022-12-24 DIAGNOSIS — R059 Cough, unspecified: Secondary | ICD-10-CM | POA: Diagnosis not present

## 2022-12-24 DIAGNOSIS — R509 Fever, unspecified: Secondary | ICD-10-CM | POA: Diagnosis not present

## 2022-12-24 DIAGNOSIS — R051 Acute cough: Secondary | ICD-10-CM | POA: Diagnosis not present

## 2022-12-24 MED ORDER — AMOXICILLIN-POT CLAVULANATE 875-125 MG PO TABS: 1.0000 | ORAL_TABLET | Freq: Two times a day (BID) | ORAL | 0 refills | Status: DC

## 2022-12-24 MED ORDER — BENZONATATE 200 MG PO CAPS: 200.0000 mg | ORAL_CAPSULE | Freq: Three times a day (TID) | ORAL | 0 refills | Status: DC | PRN

## 2022-12-24 NOTE — Assessment & Plan Note (Addendum)
Symptoms go along with bacterial sinus infection---but recent fever, etc worrisome for pneumonia Will check CXR  CXR looks normal--at most slight increased interstitial markings LLL (but I think normal)  Will treat with augmentin 875 bid for 7 days On robitussin DM--Rx for benzonatate

## 2022-12-24 NOTE — Progress Notes (Signed)
Subjective:    Patient ID: Brittany [name], female    DOB: [birth year], 67 y.o.   MRN: [id number]  HPI Here due to a respiratory infection  Started with symptoms 10-14 days ago Started with dry cough Then had some chest burning Tried vicks, etc Very congested in head---all drainage down throat (no runny nose) Cough looser then--but no production Ears plugged--and some sharp pain Having some fever in the past couple of days Has to sleep with head up on pillows--and using vaporizer Awoke choking this morning---some trouble breathing Did have some chills yesterday--with 101.7 fever yesterday The sweat when fever broke Has DOE---with walking and cough  Current Outpatient Medications on File Prior to Visit  Medication Sig Dispense Refill   Ascorbic Acid (VITAMIN C) 1000 MG tablet Take 1,000 mg by mouth daily.     atorvastatin (LIPITOR) 20 MG tablet TAKE 1 TABLET BY MOUTH EVERY DAY 90 tablet 3   Cholecalciferol (VITAMIN D3) 25 MCG (1000 UT) CAPS Take 1 capsule (1,000 Units total) by mouth daily. 30 capsule    cyanocobalamin (V-R VITAMIN B-12) 500 MCG tablet Take 1 tablet (500 mcg total) by mouth daily.     fluticasone (FLONASE) 50 MCG/ACT nasal spray SPRAY 2 SPRAYS INTO EACH NOSTRIL EVERY DAY 48 mL 3   gabapentin (NEURONTIN) 600 MG tablet Take 1 tablet (600 mg total) by mouth at bedtime. 90 tablet 4   ibuprofen (ADVIL,MOTRIN) 200 MG tablet Take 600 mg by mouth as needed.     levothyroxine (SYNTHROID) 100 MCG tablet Take 1 tablet (100 mcg total) by mouth daily before breakfast. 90 tablet 4   losartan (COZAAR) 50 MG tablet Take 1 tablet (50 mg total) by mouth daily. 90 tablet 4   Magnesium 200 MG TABS Take 1 tablet (200 mg total) by mouth daily.     Multiple Vitamins-Minerals (ALIVE WOMENS ENERGY PO) Take 1 tablet by mouth daily.     Omega-3 Fatty Acids (FISH OIL PO) Take by mouth daily. Takes 2,400 mg     omeprazole (PRILOSEC) 40 MG capsule Take 1 capsule (40 mg total) by mouth daily.  90 capsule 4   venlafaxine XR (EFFEXOR-XR) 37.5 MG 24 hr capsule Take 1 capsule (37.5 mg total) by mouth daily with breakfast. 90 capsule 4   zinc gluconate 50 MG tablet Take 1 tablet (50 mg total) by mouth daily.     No current facility-administered medications on file prior to visit.    Allergies  Allergen Reactions   Cymbalta [Duloxetine Hcl] Other (See Comments)    suicidality   Percocet [Oxycodone-Acetaminophen] Nausea And Vomiting and Other (See Comments)    Nauseated, dizzy    Past Medical History:  Diagnosis Date   Allergy    Arthritis    neck pain and numbness left digits Venetia Maxon), lower spine and knees   Chronic lower back pain    persistent after L1 cmp fx, with multilevel mild lumbar DDD - good resolution after L L5/S1 ESI ([facility])   Colon polyps    last colonoscopy 2006? rpt due   Diverticulitis    Fatty liver 02/2012   on CT scan 02/2012   GERD (gastroesophageal reflux disease)    History of diverticulitis of colon    s/p resection of large intestine, rpt itis 02/2012   HLD (hyperlipidemia)    HTN (hypertension)    Hypothyroidism    Migraines    occasional, stress related   OSA (obstructive sleep apnea) 09/2016   mild to moderate, consider  CPAP   Postmenopausal    HRT compound, previously premarin   Seasonal allergies    Vertebral compression fracture 07/2012   L1 25% Venetia Maxon) released without intervention    Past Surgical History:  Procedure Laterality Date   ANTERIOR FUSION CERVICAL SPINE  01/1999   cervical HNP   COLON RESECTION  2008   2 ft removed, diverticulitis   COLONOSCOPY  03/2012   partial colectomy, scattered diverticulae Jarold Motto), rpt 10 yrs   COLONOSCOPY  12/2020   diverticulosis, hem, rpt 10 yrs (Mansouraty)   ESI Left 06/2013, 12/2013, 09/2014   L5/S1 with good resolution of pain   ESOPHAGOGASTRODUODENOSCOPY  03/2012   mod gastritis, benign polyps, H pylori neg   ESOPHAGOGASTRODUODENOSCOPY  12/2020   non-obstructive schatzki ring,  3cm HH, gastric polyps (Mansouraty)   FOOT SURGERY Left 01/2014   big toe fusion   KNEE SURGERY  1995, 2011   torn menisci (MRI 2010 L)   SPIROMETRY  2011   normal   TONSILLECTOMY  1964   TOTAL ABDOMINAL HYSTERECTOMY W/ BILATERAL SALPINGOOPHORECTOMY  06/2009   vag bleeding   UPPER GASTROINTESTINAL ENDOSCOPY     US ECHOCARDIOGRAPHY  08/2010   normal, EF 63%, mild valvular issues    Family History  Problem Relation Age of Onset   Coronary artery disease Mother 33       CABG and valve replacement   Arthritis Mother    Valvular heart disease Mother        Had valve replacement   Coronary artery disease Father 51       CAD/MI   Hypertension Father    Heart disease Father    Diabetes Sister    Hypertension Sister    Hypertension Brother    Coronary artery disease Brother        Stent   Coronary artery disease Brother        Stents   Hypertension Brother    Hypertension Sister    Mitral valve prolapse Sister    Hypertension Sister    Thyroid disease Sister    Colon polyps Sister    Irritable bowel syndrome Sister    Diabetes Sister    Diabetes Maternal Grandfather    Thyroid disease Son    Cancer Neg Hx    Colon cancer Neg Hx    Esophageal cancer Neg Hx    Inflammatory bowel disease Neg Hx    Liver disease Neg Hx    Pancreatic cancer Neg Hx    Rectal cancer Neg Hx    Stomach cancer Neg Hx     Social History   Socioeconomic History   Marital status: Married    Spouse name: Not on file   Number of children: 2   Years of education: HS   Highest education level: 12th grade  Occupational History   Occupation: retired    Associate Professor: NOT EMPLOYED  Tobacco Use   Smoking status: Never   Smokeless tobacco: Never  Vaping Use   Vaping Use: Never used  Substance and Sexual Activity   Alcohol use: Yes    Comment: occasional beer on the weekends   Drug use: No   Sexual activity: Yes    Birth control/protection: Surgical  Other Topics Concern   Not on file  Social  History Narrative   Caffeine: 2-3 cups/day (1-2 coffee); occasional beer   Lives with husband and 14yo son, 4 dogs   Activity: no scheduled - enjoys walking, but just usually run around doing shopping  etc. Does not do routine exercise.   Diet: lots of water, fruits/vegetable   Social Determinants of Health   Financial Resource Strain: Low Risk  (12/23/2022)   Overall Financial Resource Strain (CARDIA)    Difficulty of Paying Living Expenses: Not hard at all  Food Insecurity: No Food Insecurity (12/23/2022)   Hunger Vital Sign    Worried About Running Out of Food in the Last Year: Never true    Ran Out of Food in the Last Year: Never true  Transportation Needs: No Transportation Needs (12/23/2022)   PRAPARE - Administrator, Civil Service (Medical): No    Lack of Transportation (Non-Medical): No  Physical Activity: Unknown (12/23/2022)   Exercise Vital Sign    Days of Exercise per Week: 0 days    Minutes of Exercise per Session: Not on file  Stress: No Stress Concern Present (12/23/2022)   [employer] of Occupational Health - Occupational Stress Questionnaire    Feeling of Stress : Not at all  Social Connections: Moderately Isolated (12/23/2022)   Social Connection and Isolation Panel [NHANES]    Frequency of Communication with Friends and Family: More than three times a week    Frequency of Social Gatherings with Friends and Family: More than three times a week    Attends Religious Services: Never    Database administrator or Organizations: No    Attends Engineer, structural: Not on file    Marital Status: Married  Catering manager Violence: Not At Risk (06/13/2020)   Humiliation, Afraid, Rape, and Kick questionnaire    Fear of Current or Ex-Partner: No    Emotionally Abused: No    Physically Abused: No    Sexually Abused: No    Review of Systems No N/V Appetite is off Feels extra thirsty    Objective:   Physical Exam Constitutional:       Appearance: Normal appearance.     Comments: Freq coarse cough  HENT:     Head:     Comments: No sinus tenderness    Left Ear: Tympanic membrane and ear canal normal.     Ears:     Comments: Right TM occluded with cerumen    Mouth/Throat:     Pharynx: No oropharyngeal exudate or posterior oropharyngeal erythema.  Pulmonary:     Effort: Pulmonary effort is normal.     Breath sounds: Normal breath sounds. No wheezing or rales.  Neurological:     Mental Status: She is alert.            Assessment & Plan:

## 2023-01-09 DIAGNOSIS — M25562 Pain in left knee: Secondary | ICD-10-CM | POA: Diagnosis not present

## 2023-03-19 ENCOUNTER — Ambulatory Visit
Admission: RE | Admit: 2023-03-19 | Discharge: 2023-03-19 | Disposition: A | Discharge status: Home / Self Care | Payer: Medicare Other | Source: Ambulatory Visit | Attending: Family Medicine | Admitting: Family Medicine

## 2023-03-19 DIAGNOSIS — Z1231 Encounter for screening mammogram for malignant neoplasm of breast: Secondary | ICD-10-CM | POA: Diagnosis not present

## 2023-03-21 ENCOUNTER — Ambulatory Visit: Payer: Medicare Other | Admitting: Family Medicine

## 2023-04-09 DIAGNOSIS — H40033 Anatomical narrow angle, bilateral: Secondary | ICD-10-CM | POA: Diagnosis not present

## 2023-04-09 DIAGNOSIS — H2513 Age-related nuclear cataract, bilateral: Secondary | ICD-10-CM | POA: Diagnosis not present

## 2023-04-16 ENCOUNTER — Telehealth: Payer: Self-pay

## 2023-04-16 DIAGNOSIS — E039 Hypothyroidism, unspecified: Secondary | ICD-10-CM

## 2023-04-16 NOTE — Telephone Encounter (Addendum)
Plz notify pt and if ok by her then may change.  She will need TSH checked 6 wks after starting new manufacturer. TSH ordered.

## 2023-04-16 NOTE — Addendum Note (Signed)
Addended by: Eustaquio Boyden on: 04/16/2023 05:24 PM   Modules accepted: Orders

## 2023-04-16 NOTE — Telephone Encounter (Signed)
Received faxed message from [facility] there is a change in manufacturer for pt's levothyroxine form Amneal to Lupin. Requesting permission to fill under new manufacturer.

## 2023-04-17 ENCOUNTER — Encounter: Payer: Self-pay | Admitting: Family Medicine

## 2023-04-17 NOTE — Telephone Encounter (Signed)
Received MyChart message from pt requesting change in manufacturer (see 04/17/23 pt msg).  Spoke with a pharmacist with [facility] informing them Dr. Reece Agar gives ok for new manufacturer and pt agrees. She documented info and will fill for pt.   Replied to pt's MyChart message relaying info above and Dr. Timoteo Expose message to recheck TSH in 6 wks after starting new manufacturer.

## 2023-05-02 ENCOUNTER — Ambulatory Visit (INDEPENDENT_AMBULATORY_CARE_PROVIDER_SITE_OTHER)
Admission: RE | Admit: 2023-05-02 | Discharge: 2023-05-02 | Disposition: A | Discharge status: Home / Self Care | Payer: Medicare Other | Source: Ambulatory Visit | Attending: Family Medicine | Admitting: Family Medicine

## 2023-05-02 ENCOUNTER — Ambulatory Visit: Payer: Medicare Other | Admitting: Family Medicine

## 2023-05-02 ENCOUNTER — Encounter: Payer: Self-pay | Admitting: Family Medicine

## 2023-05-02 VITALS — BP 122/70 | HR 82 | Temp 97.2°F | Ht 63.0 in | Wt 176.0 lb

## 2023-05-02 DIAGNOSIS — R051 Acute cough: Secondary | ICD-10-CM

## 2023-05-02 DIAGNOSIS — R0989 Other specified symptoms and signs involving the circulatory and respiratory systems: Secondary | ICD-10-CM | POA: Diagnosis not present

## 2023-05-02 DIAGNOSIS — R059 Cough, unspecified: Secondary | ICD-10-CM | POA: Diagnosis not present

## 2023-05-02 LAB — POC COVID19 BINAXNOW: SARS Coronavirus 2 Ag: NEGATIVE

## 2023-05-02 MED ORDER — AMOXICILLIN-POT CLAVULANATE 875-125 MG PO TABS: 1.0000 | ORAL_TABLET | Freq: Two times a day (BID) | ORAL | 0 refills | Status: AC

## 2023-05-02 MED ORDER — AZITHROMYCIN 250 MG PO TABS: ORAL_TABLET | ORAL | 0 refills | Status: AC

## 2023-05-02 NOTE — Addendum Note (Signed)
Addended by: Donnamarie Poag on: 05/02/2023 12:33 PM   Modules accepted: Orders

## 2023-05-02 NOTE — Progress Notes (Signed)
Ph: [phone] Fax: [phone]   Patient ID: Brittany [name], female    DOB: April 13, 1956, 67 y.o.   MRN: [id number]  This visit was conducted in person.  BP 122/70   Pulse 82   Temp (!) 97.2 F (36.2 C) (Temporal)   Ht 5\' 3"  (1.6 m)   Wt 176 lb (79.8 kg)   SpO2 98%   BMI 31.18 kg/m    CC: cough  Subjective:   HPI: Brittany [name] is a 67 y.o. female presenting on 05/02/2023 for Cough (Over a week not productive )   1+ wk h/o chest congestion, coughing fits, today she's lost her voice. Tmax 101 2 days ago. Rattly cough but unable to produce sputum. Mild ST attributes to cough. Ears feel clogged. Mild dyspnea and wheeze.  Cough drop helps voice.  Sinuses largely clear.  No ear or tooth pain, headache, sinus pressure, abd pain, diarrhea or nausea.   Treating symptoms with dayquil, nyquil.  Lucila Maine has been sick as well  Previous sinusitis treated x3 late last year, latest augmentin + singulair. Singulair wasn't too helpful.  Seen again 12/2022 with cough dx with sinusitis treated with 7d augmentin, tessalon (no benefit).      Relevant past medical, surgical, family and social history reviewed and updated as indicated. Interim medical history since our last visit reviewed. Allergies and medications reviewed and updated. Outpatient Medications Prior to Visit  Medication Sig Dispense Refill   Ascorbic Acid (VITAMIN C) 1000 MG tablet Take 1,000 mg by mouth daily.     atorvastatin (LIPITOR) 20 MG tablet TAKE 1 TABLET BY MOUTH EVERY DAY 90 tablet 3   Cholecalciferol (VITAMIN D3) 25 MCG (1000 UT) CAPS Take 1 capsule (1,000 Units total) by mouth daily. 30 capsule    cyanocobalamin (V-R VITAMIN B-12) 500 MCG tablet Take 1 tablet (500 mcg total) by mouth daily.     fluticasone (FLONASE) 50 MCG/ACT nasal spray SPRAY 2 SPRAYS INTO EACH NOSTRIL EVERY DAY 48 mL 3   gabapentin (NEURONTIN) 600 MG tablet Take 1 tablet (600 mg total) by mouth at bedtime. 90 tablet 4   ibuprofen  (ADVIL,MOTRIN) 200 MG tablet Take 600 mg by mouth as needed.     levothyroxine (SYNTHROID) 100 MCG tablet Take 1 tablet (100 mcg total) by mouth daily before breakfast. 90 tablet 4   losartan (COZAAR) 50 MG tablet Take 1 tablet (50 mg total) by mouth daily. 90 tablet 4   Magnesium 200 MG TABS Take 1 tablet (200 mg total) by mouth daily.     Multiple Vitamins-Minerals (ALIVE WOMENS ENERGY PO) Take 1 tablet by mouth daily.     omeprazole (PRILOSEC) 40 MG capsule Take 1 capsule (40 mg total) by mouth daily. 90 capsule 4   venlafaxine XR (EFFEXOR-XR) 37.5 MG 24 hr capsule Take 1 capsule (37.5 mg total) by mouth daily with breakfast. 90 capsule 4   zinc gluconate 50 MG tablet Take 1 tablet (50 mg total) by mouth daily.     amoxicillin-clavulanate (AUGMENTIN) 875-125 MG tablet Take 1 tablet by mouth 2 (two) times daily. 14 tablet 0   benzonatate (TESSALON) 200 MG capsule Take 1 capsule (200 mg total) by mouth 3 (three) times daily as needed for cough. 60 capsule 0   Omega-3 Fatty Acids (FISH OIL PO) Take by mouth daily. Takes 2,400 mg     No facility-administered medications prior to visit.     Per HPI unless specifically indicated in ROS section below Review of  Systems  Objective:  BP 122/70   Pulse 82   Temp (!) 97.2 F (36.2 C) (Temporal)   Ht 5\' 3"  (1.6 m)   Wt 176 lb (79.8 kg)   SpO2 98%   BMI 31.18 kg/m   Wt Readings from Last 3 Encounters:  05/02/23 176 lb (79.8 kg)  12/24/22 174 lb (78.9 kg)  09/20/22 175 lb 2 oz (79.4 kg)      Physical Exam Vitals and nursing note reviewed.  Constitutional:      Appearance: Normal appearance. She is not ill-appearing.  HENT:     Head: Normocephalic and atraumatic.     Right Ear: Ear canal and external ear normal. There is impacted cerumen.     Left Ear: Tympanic membrane, ear canal and external ear normal. There is no impacted cerumen.     Ears:     Comments: Cerumen impaction on right    Nose:     Comments: Wearing mask     Mouth/Throat:     Mouth: Mucous membranes are moist.     Pharynx: Oropharynx is clear. No oropharyngeal exudate or posterior oropharyngeal erythema.  Eyes:     Extraocular Movements: Extraocular movements intact.     Conjunctiva/sclera: Conjunctivae normal.     Pupils: Pupils are equal, round, and reactive to light.  Cardiovascular:     Rate and Rhythm: Normal rate and regular rhythm.     Pulses: Normal pulses.     Heart sounds: Normal heart sounds. No murmur heard. Pulmonary:     Effort: Pulmonary effort is normal. No respiratory distress.     Breath sounds: Rhonchi (diffuse R>L) and rales (RUL predominant) present. No wheezing.     Comments: Coughing fits present Musculoskeletal:     Cervical back: Normal range of motion and neck supple.  Lymphadenopathy:     Head:     Right side of head: No submental, submandibular, tonsillar, preauricular or posterior auricular adenopathy.     Left side of head: No submental, submandibular, tonsillar, preauricular or posterior auricular adenopathy.     Cervical: No cervical adenopathy.     Right cervical: No superficial cervical adenopathy.    Left cervical: No superficial cervical adenopathy.     Upper Body:     Right upper body: No supraclavicular adenopathy.     Left upper body: No supraclavicular adenopathy.  Skin:    Findings: No rash.  Neurological:     Mental Status: She is alert.  Psychiatric:        Mood and Affect: Mood normal.        Behavior: Behavior normal.        Assessment & Plan:   Problem List Items Addressed This Visit     Cough - Primary    With abnormal R lung sounds, check CXR today - possible RLL haziness.  Will also check COVID swab - negative.  Concern for CAP - Rx augmentin + azithromycin course. She declines Rx cough syrup. Tessalon previously did not help.  Update if not improving with antibiotic treatment.      Relevant Orders   DG Chest 2 View     Meds ordered this encounter  Medications    amoxicillin-clavulanate (AUGMENTIN) 875-125 MG tablet    Sig: Take 1 tablet by mouth 2 (two) times daily for 10 days.    Dispense:  20 tablet    Refill:  0   azithromycin (ZITHROMAX) 250 MG tablet    Sig: Take 2 tablets on day 1,  then 1 tablet daily on days 2 through 5    Dispense:  6 tablet    Refill:  0    Orders Placed This Encounter  Procedures   DG Chest 2 View    Standing Status:   Future    Number of Occurrences:   1    Standing Expiration Date:   05/01/2024    Order Specific Question:   Reason for Exam (SYMPTOM  OR DIAGNOSIS REQUIRED)    Answer:   cough, R sided rales/rhonchi    Order Specific Question:   Preferred imaging location?    Answer:   [facility]    Patient Instructions  Ok to stop singulair.  COVID swab today Chest xray today   Follow up plan: Return if symptoms worsen or fail to improve.  Eustaquio Boyden, MD

## 2023-05-02 NOTE — Assessment & Plan Note (Addendum)
With abnormal R lung sounds, check CXR today - possible RLL haziness.  Will also check COVID swab - negative.  Concern for CAP - Rx augmentin + azithromycin course. She declines Rx cough syrup. Tessalon previously did not help.  Update if not improving with antibiotic treatment.

## 2023-05-02 NOTE — Patient Instructions (Addendum)
Ok to stop singulair.  COVID swab today Chest xray today

## 2023-06-26 DIAGNOSIS — M25562 Pain in left knee: Secondary | ICD-10-CM | POA: Diagnosis not present

## 2023-06-27 ENCOUNTER — Encounter: Payer: Self-pay | Admitting: Family Medicine

## 2023-07-04 ENCOUNTER — Other Ambulatory Visit (INDEPENDENT_AMBULATORY_CARE_PROVIDER_SITE_OTHER): Payer: Medicare Other

## 2023-07-04 DIAGNOSIS — E039 Hypothyroidism, unspecified: Secondary | ICD-10-CM

## 2023-07-04 NOTE — Addendum Note (Signed)
Addended by: Alvina Chou on: 07/04/2023 02:34 PM   Modules accepted: Orders

## 2023-07-05 ENCOUNTER — Other Ambulatory Visit: Payer: Self-pay | Admitting: Family Medicine

## 2023-07-05 LAB — TSH: TSH: 0.32 m[IU]/L — ABNORMAL LOW (ref 0.40–4.50)

## 2023-07-05 MED ORDER — LEVOTHYROXINE SODIUM 88 MCG PO TABS: 88.0000 ug | ORAL_TABLET | Freq: Every day | ORAL | 1 refills | Status: DC

## 2023-07-10 ENCOUNTER — Ambulatory Visit: Payer: Medicare Other | Attending: Cardiology | Admitting: Cardiology

## 2023-07-10 ENCOUNTER — Encounter: Payer: Self-pay | Admitting: Cardiology

## 2023-07-10 VITALS — BP 146/80 | HR 76 | Ht 64.0 in | Wt 174.6 lb

## 2023-07-10 DIAGNOSIS — R002 Palpitations: Secondary | ICD-10-CM

## 2023-07-10 DIAGNOSIS — I10 Essential (primary) hypertension: Secondary | ICD-10-CM

## 2023-07-10 DIAGNOSIS — E785 Hyperlipidemia, unspecified: Secondary | ICD-10-CM

## 2023-07-10 NOTE — Assessment & Plan Note (Signed)
Most recent lipids from January are well-controlled on 20 mg atorvastatin.  Continue current meds.  Labs followed by PCP.

## 2023-07-10 NOTE — Assessment & Plan Note (Signed)
Chronic condition.  Usually what much better controlled than it is today.  She forgot to take her medicines this morning because she was in a rush.  Usually much better controlled at home.  Continue to monitor.  On losartan 50 mg daily.

## 2023-07-10 NOTE — Patient Instructions (Signed)
Medication Instructions:  Your physician recommends that you continue on your current medications as directed. Please refer to the Current Medication list given to you today.  *If you need a refill on your cardiac medications before your next appointment, please call your pharmacy*  Lab Work: - None ordered  Testing/Procedures: - None ordered  Follow-Up: At [facility], you and your health needs are our priority.  As part of our continuing mission to provide you with exceptional heart care, we have created designated Provider Care Teams.  These Care Teams include your primary Cardiologist (physician) and Advanced Practice Providers (APPs -  Physician Assistants and Nurse Practitioners) who all work together to provide you with the care you need, when you need it.  Your next appointment:   8 - 9 month(s)  Provider:   Bryan Lemma, MD    Other Instructions - None

## 2023-07-10 NOTE — Assessment & Plan Note (Signed)
Considerably well-controlled at this point even with anxiety.  Not on any beta-blockers at this point.  Continue to hold off since she is doing well.

## 2023-07-10 NOTE — Progress Notes (Signed)
Cardiology Office Note:  .   Date:  07/10/2023  ID:  Brittany [name], DOB 22-Feb-1956, MRN [id number] PCP: Eustaquio Boyden, MD   HeartCare Providers Cardiologist:  Bryan Lemma, MD     Chief Complaint  Patient presents with   Follow-up    12 month follow up visit. Patient states that her hands go numb and pain in her shoulder on the left side. Meds reviewed.     Patient Profile: .     Brittany [name] is a borderline obese 67 y.o. female with a PMH notable for HTN, HLD, OSA and fatty liver disease who presents here for delayed annual follow-up evaluation of palpitations at the request of Eustaquio Boyden, MD. she presents along with her husband Brittany [name]).     Brittany [name] was last seen in May 2023 -> noted palpitations were significant improved.  No longer taking beta-blocker.  Job was getting busier with increased responsibilities.  Subjective  Brittany [name] returns here today for follow-up overall doing pretty well from a cardiac standpoint.  Her biggest issue is the stress and anxiety of caring not only for other family members but also now her husband who in addition to his previous issues now has IPF on oxygen.  Despite this she is not really having any anxiety attacks.  She says that her blood pressure is elevated today because she did not take her medicines this morning trying to rush to get him ready to go.  Usually at home was in the 120s to 130s systolic.  She says that her palpitations have been doing well despite the fact that she is having all this anxiety and stress indicating that she is being able to cope quite well.  She is tolerating her 20 mg Lipitor without any issues along with the losartan 50 mg daily. The most notable issue over the last several months is that they have been adjusting her Synthroid doses to lower dosing originally at 112 down to 100 and now down to 88 mcg.  Despite making adjustments to her thyroid levels she has not had any worsening  or her changes in any heart rhythm issues. She remains overall stable with major cardiovascular concerns.: Cardiovascular ROS: positive for - dyspnea on exertion, palpitations, and these are both relatively stable, palpitations and notably improved in the exertional dyspnea has been stable and chronic for years now.  No real change.  She has become more sedentary of late which could explain some of that. negative for - chest pain, edema, orthopnea, paroxysmal nocturnal dyspnea, rapid heart rate, shortness of breath, or syncope or near syncope, TIA or emesis BX, claudication.  ROS:  Review of Systems - Negative except stress & tension pains in shoulders / hands/wrists   Objective   Studies Reviewed: Marland Kitchen   EKG Interpretation Date/Time:  Thursday July 10 2023 08:09:21 EDT Ventricular Rate:  76 PR Interval:  138 QRS Duration:  78 QT Interval:  390 QTC Calculation: 438 R Axis:   -35  Text Interpretation: Sinus rhythm with frequent Premature ventricular complexes Left axis deviation No previous ECGs available Confirmed by Bryan Lemma ([id number]) on 07/10/2023 8:16:55 AM    No recent studies Lab Results  Component Value Date   CHOL 164 09/13/2022   HDL 50.90 09/13/2022   LDLCALC 77 09/13/2022   LDLDIRECT 154.0 08/14/2012   TRIG 185.0 (H) 09/13/2022   CHOLHDL 3 09/13/2022   Lab Results  Component Value Date   NA 139 09/13/2022   K  4.3 09/13/2022   CREATININE 0.85 09/13/2022   GFR 70.83 09/13/2022   GLUCOSE 97 09/13/2022    CPX 07/31/2016: Excellent exercise capacity.  Primary limitation related to obesity with related restrictive lung physiology.  Hypertensive response to exercise with mildly elevated VE/VCO2 slope that may reflect some diastolic dysfunction.  Recommend weight loss and BP control.  Risk Assessment/Calculations:     HYPERTENSION CONTROL Vitals:   07/10/23 0805 07/10/23 0909  BP: (!) 152/85 (!) 146/80    The patient's blood pressure is elevated above target  today.  In order to address the patient's elevated BP: Blood pressure will be monitored at home to determine if medication changes need to be made.; Follow up with primary care provider for management. (did not take am meds -- rushing to get out -- lots of stressed)          Physical Exam:   VS:  BP (!) 146/80   Pulse 76   Ht 5\' 4"  (1.626 m)   Wt 174 lb 9.6 oz (79.2 kg)   SpO2 98%   BMI 29.97 kg/m    Wt Readings from Last 3 Encounters:  07/10/23 174 lb 9.6 oz (79.2 kg)  05/02/23 176 lb (79.8 kg)  12/24/22 174 lb (78.9 kg)    GEN: Well nourished, well developed in no acute distress; healthy appearing.  NECK: No JVD; No carotid bruits CARDIAC: Normal S1, S2; RRR, no murmurs, rubs, gallops RESPIRATORY:  Clear to auscultation without rales, wheezing or rhonchi ; nonlabored, good air movement. ABDOMEN: Soft, non-tender, non-distended EXTREMITIES:  No edema; No deformity     ASSESSMENT AND PLAN: .    Problem List Items Addressed This Visit       Cardiology Problems   Essential hypertension (Chronic)    Chronic condition.  Usually what much better controlled than it is today.  She forgot to take her medicines this morning because she was in a rush.  Usually much better controlled at home.  Continue to monitor.  On losartan 50 mg daily.      Hyperlipidemia with target LDL less than 100 (Chronic)    Most recent lipids from January are well-controlled on 20 mg atorvastatin.  Continue current meds.  Labs followed by PCP.        Other   Palpitations - Primary (Chronic)    Considerably well-controlled at this point even with anxiety.  Not on any beta-blockers at this point.  Continue to hold off since she is doing well.      Relevant Orders   EKG 12-Lead (Completed)        Follow-Up: Return in about 9 months (around 04/08/2024) for 8-9 month follow-up., Follow-up in [place].  Total time spent: 18 min spent with patient + 13 min spent charting = 31  min      Signed, Marykay Lex, MD, MS Bryan Lemma, M.D., M.S. Interventional Cardiologist  [facility]  Pager # [phone] Phone # [phone] [address] [place], [place] [address]

## 2023-08-15 ENCOUNTER — Ambulatory Visit (INDEPENDENT_AMBULATORY_CARE_PROVIDER_SITE_OTHER): Payer: Medicare Other | Admitting: Internal Medicine

## 2023-08-15 ENCOUNTER — Encounter: Payer: Self-pay | Admitting: Family Medicine

## 2023-08-15 ENCOUNTER — Encounter: Payer: Self-pay | Admitting: Internal Medicine

## 2023-08-15 VITALS — BP 138/84 | HR 84 | Temp 99.3°F | Ht 64.0 in | Wt 177.0 lb

## 2023-08-15 DIAGNOSIS — J0141 Acute recurrent pansinusitis: Secondary | ICD-10-CM

## 2023-08-15 MED ORDER — AMOXICILLIN-POT CLAVULANATE 875-125 MG PO TABS
1.0000 | ORAL_TABLET | Freq: Two times a day (BID) | ORAL | 0 refills | Status: DC
Start: 2023-08-15 — End: 2023-10-03

## 2023-08-15 NOTE — Telephone Encounter (Signed)
Spoke with [name] (on husband's dpr) to schedule video visit for husband, Brittany [name] (MRN [id number]) today at 2:20 with [facility].

## 2023-08-15 NOTE — Assessment & Plan Note (Signed)
Unclear if has secondary infection yet---but has generally gotten them after viral infections Discussed continuing flonase--and analgesics Will give augmentin 875 bid (5-10 days depending on response)

## 2023-08-15 NOTE — Progress Notes (Signed)
Subjective:    Patient ID: Brittany [name], female    DOB: 1955-10-28, 67 y.o.   MRN: [id number]  HPI Here due to respiratory infection  Is watching grandkids---and they are sick Has caught something from them Started 4 days ago--but worsening Tried dayquil/nyquil--not helping  Having frontal head pain Some bad right maxillary pain also No fever Lots of post nasal drip--and rhinorrhea Not much cough No SOB Ears feel muffled--like her whole head  Current Outpatient Medications on File Prior to Visit  Medication Sig Dispense Refill   Ascorbic Acid (VITAMIN C) 1000 MG tablet Take 1,000 mg by mouth daily.     atorvastatin (LIPITOR) 20 MG tablet TAKE 1 TABLET BY MOUTH EVERY DAY 90 tablet 3   Cholecalciferol (VITAMIN D3) 25 MCG (1000 UT) CAPS Take 1 capsule (1,000 Units total) by mouth daily. 30 capsule    cyanocobalamin (V-R VITAMIN B-12) 500 MCG tablet Take 1 tablet (500 mcg total) by mouth daily.     fluticasone (FLONASE) 50 MCG/ACT nasal spray SPRAY 2 SPRAYS INTO EACH NOSTRIL EVERY DAY 48 mL 3   gabapentin (NEURONTIN) 600 MG tablet Take 1 tablet (600 mg total) by mouth at bedtime. 90 tablet 4   ibuprofen (ADVIL,MOTRIN) 200 MG tablet Take 600 mg by mouth as needed.     levothyroxine (SYNTHROID) 88 MCG tablet Take 1 tablet (88 mcg total) by mouth daily before breakfast. 90 tablet 1   losartan (COZAAR) 50 MG tablet Take 1 tablet (50 mg total) by mouth daily. 90 tablet 4   Magnesium 200 MG TABS Take 1 tablet (200 mg total) by mouth daily.     omeprazole (PRILOSEC) 40 MG capsule Take 1 capsule (40 mg total) by mouth daily. 90 capsule 4   venlafaxine XR (EFFEXOR-XR) 37.5 MG 24 hr capsule Take 1 capsule (37.5 mg total) by mouth daily with breakfast. 90 capsule 4   zinc gluconate 50 MG tablet Take 1 tablet (50 mg total) by mouth daily.     No current facility-administered medications on file prior to visit.    Allergies  Allergen Reactions   Cymbalta [Duloxetine Hcl] Other (See  Comments)    suicidality   Percocet [Oxycodone-Acetaminophen] Nausea And Vomiting and Other (See Comments)    Nauseated, dizzy    Past Medical History:  Diagnosis Date   Allergy    Arthritis    neck pain and numbness left digits Venetia Maxon), lower spine and knees   Chronic lower back pain    persistent after L1 cmp fx, with multilevel mild lumbar DDD - good resolution after L L5/S1 ESI ([facility])   Colon polyps    last colonoscopy 2006? rpt due   Diverticulitis    Fatty liver 02/2012   on CT scan 02/2012   GERD (gastroesophageal reflux disease)    History of diverticulitis of colon    s/p resection of large intestine, rpt itis 02/2012   HLD (hyperlipidemia)    HTN (hypertension)    Hypothyroidism    Migraines    occasional, stress related   OSA (obstructive sleep apnea) 09/2016   mild to moderate, consider CPAP   Postmenopausal    HRT compound, previously premarin   Seasonal allergies    Vertebral compression fracture (HCC) 07/2012   L1 25% Venetia Maxon) released without intervention    Past Surgical History:  Procedure Laterality Date   ANTERIOR FUSION CERVICAL SPINE  01/1999   cervical HNP   COLON RESECTION  2008   2 ft removed, diverticulitis  COLONOSCOPY  03/2012   partial colectomy, scattered diverticulae Jarold Motto), rpt 10 yrs   COLONOSCOPY  12/2020   diverticulosis, hem, rpt 10 yrs (Mansouraty)   ESI Left 06/2013, 12/2013, 09/2014   L5/S1 with good resolution of pain   ESOPHAGOGASTRODUODENOSCOPY  03/2012   mod gastritis, benign polyps, H pylori neg   ESOPHAGOGASTRODUODENOSCOPY  12/2020   non-obstructive schatzki ring, 3cm HH, gastric polyps (Mansouraty)   FOOT SURGERY Left 01/2014   big toe fusion   KNEE SURGERY  1995, 2011   torn menisci (MRI 2010 L)   SPIROMETRY  2011   normal   TONSILLECTOMY  1964   TOTAL ABDOMINAL HYSTERECTOMY W/ BILATERAL SALPINGOOPHORECTOMY  06/2009   vag bleeding   UPPER GASTROINTESTINAL ENDOSCOPY     US ECHOCARDIOGRAPHY  08/2010   normal,  EF 63%, mild valvular issues    Family History  Problem Relation Age of Onset   Coronary artery disease Mother 10       CABG and valve replacement   Arthritis Mother    Valvular heart disease Mother        Had valve replacement   Coronary artery disease Father 75       CAD/MI   Hypertension Father    Heart disease Father    Diabetes Sister    Hypertension Sister    Hypertension Brother    Coronary artery disease Brother        Stent   Coronary artery disease Brother        Stents   Hypertension Brother    Hypertension Sister    Mitral valve prolapse Sister    Hypertension Sister    Thyroid disease Sister    Colon polyps Sister    Irritable bowel syndrome Sister    Diabetes Sister    Diabetes Maternal Grandfather    Thyroid disease Son    Cancer Neg Hx    Colon cancer Neg Hx    Esophageal cancer Neg Hx    Inflammatory bowel disease Neg Hx    Liver disease Neg Hx    Pancreatic cancer Neg Hx    Rectal cancer Neg Hx    Stomach cancer Neg Hx     Social History   Socioeconomic History   Marital status: Married    Spouse name: Not on file   Number of children: 2   Years of education: HS   Highest education level: 12th grade  Occupational History   Occupation: retired    Associate Professor: NOT EMPLOYED  Tobacco Use   Smoking status: Never   Smokeless tobacco: Never  Vaping Use   Vaping status: Never Used  Substance and Sexual Activity   Alcohol use: Yes    Comment: occasional beer on the weekends   Drug use: No   Sexual activity: Yes    Birth control/protection: Surgical  Other Topics Concern   Not on file  Social History Narrative   Caffeine: 2-3 cups/day (1-2 coffee); occasional beer   Lives with husband and 14yo son, 4 dogs   Activity: no scheduled - enjoys walking, but just usually run around doing shopping etc. Does not do routine exercise.   Diet: lots of water, fruits/vegetable   Social Determinants of Health   Financial Resource Strain: Low Risk   (12/23/2022)   Overall Financial Resource Strain (CARDIA)    Difficulty of Paying Living Expenses: Not hard at all  Food Insecurity: No Food Insecurity (12/23/2022)   Hunger Vital Sign    Worried About Running Out  of Food in the Last Year: Never true    Ran Out of Food in the Last Year: Never true  Transportation Needs: No Transportation Needs (12/23/2022)   PRAPARE - Administrator, Civil Service (Medical): No    Lack of Transportation (Non-Medical): No  Physical Activity: Unknown (12/23/2022)   Exercise Vital Sign    Days of Exercise per Week: 0 days    Minutes of Exercise per Session: Not on file  Stress: No Stress Concern Present (12/23/2022)   [employer] of Occupational Health - Occupational Stress Questionnaire    Feeling of Stress : Not at all  Social Connections: Moderately Isolated (12/23/2022)   Social Connection and Isolation Panel [NHANES]    Frequency of Communication with Friends and Family: More than three times a week    Frequency of Social Gatherings with Friends and Family: More than three times a week    Attends Religious Services: Never    Database administrator or Organizations: No    Attends Engineer, structural: Not on file    Marital Status: Married  Catering manager Violence: Not At Risk (06/13/2020)   Humiliation, Afraid, Rape, and Kick questionnaire    Fear of Current or Ex-Partner: No    Emotionally Abused: No    Physically Abused: No    Sexually Abused: No   Review of Systems No change in smell or taste No N/V Eating okay     Objective:   Physical Exam Constitutional:      Appearance: Normal appearance.  HENT:     Head:     Comments: Right maxillary tenderness    Right Ear: Tympanic membrane and ear canal normal.     Left Ear: Tympanic membrane and ear canal normal.     Mouth/Throat:     Pharynx: No oropharyngeal exudate or posterior oropharyngeal erythema.  Pulmonary:     Effort: Pulmonary effort is normal.      Breath sounds: Normal breath sounds. No wheezing or rales.  Musculoskeletal:     Cervical back: Neck supple.  Lymphadenopathy:     Cervical: No cervical adenopathy.  Neurological:     Mental Status: She is alert.            Assessment & Plan:

## 2023-08-29 ENCOUNTER — Other Ambulatory Visit: Payer: Self-pay | Admitting: Family Medicine

## 2023-08-29 NOTE — Telephone Encounter (Signed)
ERx 

## 2023-09-13 ENCOUNTER — Other Ambulatory Visit: Payer: Self-pay | Admitting: Family Medicine

## 2023-09-19 ENCOUNTER — Ambulatory Visit (INDEPENDENT_AMBULATORY_CARE_PROVIDER_SITE_OTHER): Payer: Medicare Other

## 2023-09-19 VITALS — Ht 64.0 in | Wt 173.0 lb

## 2023-09-19 DIAGNOSIS — Z0000 Encounter for general adult medical examination without abnormal findings: Secondary | ICD-10-CM | POA: Diagnosis not present

## 2023-09-19 DIAGNOSIS — Z1231 Encounter for screening mammogram for malignant neoplasm of breast: Secondary | ICD-10-CM | POA: Diagnosis not present

## 2023-09-19 NOTE — Progress Notes (Addendum)
 Subjective:   Brittany [name] is a 68 y.o. female who presents for Medicare Annual (Subsequent) preventive examination.  Visit Complete: Virtual I connected with  Brittany [name] on 09/19/23 by a audio enabled telemedicine application and verified that I am speaking with the correct person using two identifiers.  Patient Location: Home  Provider Location: Home Office  I discussed the limitations of evaluation and management by telemedicine. The patient expressed understanding and agreed to proceed.  Vital Signs: Because this visit was a virtual/telehealth visit, some criteria may be missing or patient reported. Any vitals not documented were not able to be obtained and vitals that have been documented are patient reported.  Patient Medicare AWV questionnaire was completed by the patient on (not done); I have confirmed that all information answered by patient is correct and no changes since this date.  Cardiac Risk Factors include: advanced age (>73men, >37 women);dyslipidemia;hypertension;sedentary lifestyle    Objective:    Today's Vitals   09/19/23 0934  Weight: 173 lb (78.5 kg)  Height: 5' 4 (1.626 m)  PainSc: 0-No pain   Body mass index is 29.7 kg/m.     09/19/2023    9:52 AM 06/13/2020   12:06 PM 03/04/2019    9:28 AM 02/12/2018   11:20 AM 02/07/2017    1:45 PM 08/28/2016    8:08 PM  Advanced Directives  Does Patient Have a Medical Advance Directive? Yes Yes No No No No  Type of Estate Agent of [place];Living will Healthcare Power of [place];Living will      Copy of Healthcare Power of Attorney in Chart? Yes - validated most recent copy scanned in chart (See row information) No - copy requested      Would patient like information on creating a medical advance directive?   No - Patient declined No - Patient declined  Yes (MAU/Ambulatory/Procedural Areas - Information given)    Current Medications (verified) Outpatient Encounter Medications as of  09/19/2023  Medication Sig   amoxicillin -clavulanate (AUGMENTIN ) 875-125 MG tablet Take 1 tablet by mouth 2 (two) times daily.   Ascorbic Acid (VITAMIN C) 1000 MG tablet Take 1,000 mg by mouth daily.   atorvastatin  (LIPITOR) 20 MG tablet TAKE 1 TABLET BY MOUTH EVERY DAY   Cholecalciferol  (VITAMIN D3) 25 MCG (1000 UT) CAPS Take 1 capsule (1,000 Units total) by mouth daily.   cyanocobalamin  (V-R VITAMIN B-12) 500 MCG tablet Take 1 tablet (500 mcg total) by mouth daily.   fluticasone  (FLONASE ) 50 MCG/ACT nasal spray SPRAY 2 SPRAYS INTO EACH NOSTRIL EVERY DAY   gabapentin  (NEURONTIN ) 600 MG tablet Take 1 tablet (600 mg total) by mouth at bedtime.   ibuprofen (ADVIL,MOTRIN) 200 MG tablet Take 600 mg by mouth as needed.   levothyroxine  (SYNTHROID ) 88 MCG tablet Take 1 tablet (88 mcg total) by mouth daily before breakfast.   losartan  (COZAAR ) 50 MG tablet Take 1 tablet (50 mg total) by mouth daily.   Magnesium  200 MG TABS Take 1 tablet (200 mg total) by mouth daily.   omeprazole  (PRILOSEC) 40 MG capsule TAKE 1 CAPSULE BY MOUTH DAILY   venlafaxine  XR (EFFEXOR -XR) 37.5 MG 24 hr capsule TAKE 1 CAPSULE BY MOUTH DAILY  WITH BREAKFAST   zinc  gluconate 50 MG tablet Take 1 tablet (50 mg total) by mouth daily.   No facility-administered encounter medications on file as of 09/19/2023.    Allergies (verified) Cymbalta [duloxetine hcl] and Percocet [oxycodone -acetaminophen ]   History: Past Medical History:  Diagnosis Date  Allergy    Arthritis    neck pain and numbness left digits Humphrey), lower spine and knees   Chronic lower back pain    persistent after L1 cmp fx, with multilevel mild lumbar DDD - good resolution after L L5/S1 ESI ([facility])   Colon polyps    last colonoscopy 2006? rpt due   Diverticulitis    Fatty liver 02/2012   on CT scan 02/2012   GERD (gastroesophageal reflux disease)    History of diverticulitis of colon    s/p resection of large intestine, rpt itis 02/2012   HLD  (hyperlipidemia)    HTN (hypertension)    Hypothyroidism    Migraines    occasional, stress related   OSA (obstructive sleep apnea) 09/2016   mild to moderate, consider CPAP   Postmenopausal    HRT compound, previously premarin   Seasonal allergies    Vertebral compression fracture (HCC) 07/2012   L1 25% Humphrey) released without intervention   Past Surgical History:  Procedure Laterality Date   ANTERIOR FUSION CERVICAL SPINE  01/1999   cervical HNP   COLON RESECTION  2008   2 ft removed, diverticulitis   COLONOSCOPY  03/2012   partial colectomy, scattered diverticulae [place]), rpt 10 yrs   COLONOSCOPY  12/2020   diverticulosis, hem, rpt 10 yrs ([facility])   ESI Left 06/2013, 12/2013, 09/2014   L5/S1 with good resolution of pain   ESOPHAGOGASTRODUODENOSCOPY  03/2012   mod gastritis, benign polyps, H pylori neg   ESOPHAGOGASTRODUODENOSCOPY  12/2020   non-obstructive schatzki ring, 3cm HH, gastric polyps ([place])   FOOT SURGERY Left 01/2014   big toe fusion   KNEE SURGERY  1995, 2011   torn menisci (MRI 2010 L)   SPIROMETRY  2011   normal   TONSILLECTOMY  1964   TOTAL ABDOMINAL HYSTERECTOMY W/ BILATERAL SALPINGOOPHORECTOMY  06/2009   vag bleeding   UPPER GASTROINTESTINAL ENDOSCOPY     US  ECHOCARDIOGRAPHY  08/2010   normal, EF 63%, mild valvular issues   Family History  Problem Relation Age of Onset   Coronary artery disease Mother 29       CABG and valve replacement   Arthritis Mother    Valvular heart disease Mother        Had valve replacement   Coronary artery disease Father 44       CAD/MI   Hypertension Father    Heart disease Father    Diabetes Sister    Hypertension Sister    Hypertension Brother    Coronary artery disease Brother        Stent   Coronary artery disease Brother        Stents   Hypertension Brother    Hypertension Sister    Mitral valve prolapse Sister    Hypertension Sister    Thyroid  disease Sister    Colon polyps Sister     Irritable bowel syndrome Sister    Diabetes Sister    Diabetes Maternal Grandfather    Thyroid  disease Son    Cancer Neg Hx    Colon cancer Neg Hx    Esophageal cancer Neg Hx    Inflammatory bowel disease Neg Hx    Liver disease Neg Hx    Pancreatic cancer Neg Hx    Rectal cancer Neg Hx    Stomach cancer Neg Hx    Social History   Socioeconomic History   Marital status: Married    Spouse name: Not on file   Number of  children: 2   Years of education: HS   Highest education level: 12th grade  Occupational History   Occupation: retired    Associate Professor: NOT EMPLOYED  Tobacco Use   Smoking status: Never   Smokeless tobacco: Never  Vaping Use   Vaping status: Never Used  Substance and Sexual Activity   Alcohol use: Yes    Comment: occasional beer on the weekends   Drug use: No   Sexual activity: Yes    Birth control/protection: Surgical  Other Topics Concern   Not on file  Social History Narrative   Caffeine: 2-3 cups/day (1-2 coffee); occasional beer   Lives with husband and 14yo son, 4 dogs   Activity: no scheduled - enjoys walking, but just usually run around doing shopping etc. Does not do routine exercise.   Diet: lots of water, fruits/vegetable   Social Drivers of Health   Financial Resource Strain: Low Risk  (09/19/2023)   Overall Financial Resource Strain (CARDIA)    Difficulty of Paying Living Expenses: Not hard at all  Food Insecurity: No Food Insecurity (09/19/2023)   Hunger Vital Sign    Worried About Running Out of Food in the Last Year: Never true    Ran Out of Food in the Last Year: Never true  Transportation Needs: No Transportation Needs (09/19/2023)   PRAPARE - Administrator, Civil Service (Medical): No    Lack of Transportation (Non-Medical): No  Physical Activity: Inactive (09/19/2023)   Exercise Vital Sign    Days of Exercise per Week: 0 days    Minutes of Exercise per Session: 0 min  Stress: No Stress Concern Present (09/19/2023)    [employer] of Occupational Health - Occupational Stress Questionnaire    Feeling of Stress : Not at all  Social Connections: Moderately Isolated (09/19/2023)   Social Connection and Isolation Panel [NHANES]    Frequency of Communication with Friends and Family: More than three times a week    Frequency of Social Gatherings with Friends and Family: More than three times a week    Attends Religious Services: Never    Database Administrator or Organizations: No    Attends Engineer, Structural: Never    Marital Status: Married    Tobacco Counseling Counseling given: Not Answered  Clinical Intake:  Pre-visit preparation completed: Yes  Pain : No/denies pain Pain Score: 0-No pain   BMI - recorded: 29.7 Nutritional Status: BMI 25 -29 Overweight Nutritional Risks: None Diabetes: No  How often do you need to have someone help you when you read instructions, pamphlets, or other written materials from your doctor or pharmacy?: 1 - Never  Interpreter Needed?: No  Comments: lives with husband Information entered by :: B.Karle Desrosier,LPN   Activities of Daily Living    09/19/2023    9:53 AM  In your present state of health, do you have any difficulty performing the following activities:  Hearing? 0  Vision? 0  Difficulty concentrating or making decisions? 0  Walking or climbing stairs? 0  Dressing or bathing? 0  Preparing Food and eating ? N  Using the Toilet? N  In the past six months, have you accidently leaked urine? N  Do you have problems with loss of bowel control? N  Managing your Medications? N  Managing your Finances? N  Housekeeping or managing your Housekeeping? N    Patient Care Team: Rilla Baller, MD as PCP - General (Family Medicine) Anner Alm ORN, MD as PCP - Cardiology (  Cardiology)  Indicate any recent Medical Services you may have received from other than [facility] providers in the past year (date may be approximate).     Assessment:    This is a routine wellness examination for Brittany [name].  Hearing/Vision screen Hearing Screening - Comments:: Pt says her hearing is good Vision Screening - Comments:: Pt says her vision is good w/glasses [facility] -Dr Jama   Goals Addressed             This Visit's Progress    COMPLETED: Patient Stated   On track    06/13/2020, I will maintain and continue medications as prescribed.      COMPLETED: Weight (lb) < 151 lb (68.5 kg)   173 lb (78.5 kg)    Starting 03/04/2019, I will continue to use food dairy to monitor intake in an effort to lose weight.       Depression Screen    09/19/2023    9:44 AM 05/02/2023   11:40 AM 09/20/2022    9:38 AM 09/18/2021   10:18 AM 06/13/2020   12:08 PM 03/04/2019    9:28 AM 02/12/2018   11:15 AM  PHQ 2/9 Scores  PHQ - 2 Score 1 0 2 0 0 0 1  PHQ- 9 Score  3 7 6  0 0 7    Fall Risk    09/19/2023    9:40 AM 05/02/2023   11:41 AM 09/20/2022    8:30 AM 09/18/2021   10:02 AM 06/13/2020   12:08 PM  Fall Risk   Falls in the past year? 0 0 0 0 0  Number falls in past yr: 0 0   0  Injury with Fall? 0 0   0  Risk for fall due to : No Fall Risks No Fall Risks   Medication side effect  Follow up Education provided;Falls prevention discussed Falls evaluation completed   Falls evaluation completed;Falls prevention discussed    MEDICARE RISK AT HOME: Medicare Risk at Home Any stairs in or around the home?: Yes If so, are there any without handrails?: Yes Home free of loose throw rugs in walkways, pet beds, electrical cords, etc?: Yes Adequate lighting in your home to reduce risk of falls?: Yes Life alert?: No Use of a cane, walker or w/c?: No Grab bars in the bathroom?: No Shower chair or bench in shower?: Yes Elevated toilet seat or a handicapped toilet?: Yes  TIMED UP AND GO:  Was the test performed?  No    Cognitive Function:    06/13/2020   12:12 PM 03/04/2019    9:28 AM 02/12/2018   11:16 AM 02/07/2017    2:01 PM  MMSE - Mini Mental State Exam   Orientation to time 5 5 5 5   Orientation to Place 5 5 5 5   Registration 3 3 3 3   Attention/ Calculation 5 0 0 0  Recall 3 3 2 3   Recall-comments   unable to recall 1 of 3 words   Language- name 2 objects  0 0 0  Language- repeat 1 1 1 1   Language- follow 3 step command  0 3 3  Language- read & follow direction  0 0 0  Write a sentence  0 0 0  Copy design  0 0 0  Total score  17 19 20         09/19/2023    9:56 AM  6CIT Screen  What Year? 0 points  What month? 0 points  What time? 0 points  Count back from 20 0 points  Months in reverse 0 points  Repeat phrase 2 points  Total Score 2 points    Immunizations Immunization History  Administered Date(s) Administered   Fluad Quad(high Dose 65+) 05/28/2022   Fluad Trivalent(High Dose 65+) 06/04/2023   Influenza Inj Mdck Quad Pf 06/01/2018   Influenza Split 09/26/2011   Influenza, High Dose Seasonal PF 05/30/2021   Influenza,inj,Quad PF,6+ Mos 06/12/2016, 06/01/2018, 06/03/2019   Influenza-Unspecified 06/10/2015, 06/27/2017, 06/24/2020   PFIZER Comirnaty(Gray Top)Covid-19 Tri-Sucrose Vaccine 05/28/2022   PFIZER(Purple Top)SARS-COV-2 Vaccination 12/03/2019, 12/28/2019, 07/01/2020, 01/06/2021   PNEUMOCOCCAL CONJUGATE-20 09/20/2022   Pfizer Covid-19 Vaccine Bivalent Booster 48yrs & up 05/30/2021   Pfizer(Comirnaty)Fall Seasonal Vaccine 12 years and older 06/04/2023   Tdap 09/26/2011   Zoster Recombinant(Shingrix) 08/09/2022   Zoster, Live 06/12/2016    TDAP status: Up to date  Flu Vaccine status: Up to date  Pneumococcal vaccine status: Up to date  Covid-19 vaccine status: Completed vaccines  Qualifies for Shingles Vaccine? Yes   Zostavax completed Yes   Shingrix Completed?: Yes  Screening Tests Health Maintenance  Topic Date Due   DTaP/Tdap/Td (2 - Td or Tdap) 09/25/2021   Zoster Vaccines- Shingrix (2 of 2) 10/04/2022   MAMMOGRAM  03/18/2024   Medicare Annual Wellness (AWV)  09/18/2024   Colonoscopy   12/15/2030   Pneumonia Vaccine 26+ Years old  Completed   INFLUENZA VACCINE  Completed   DEXA SCAN  Completed   COVID-19 Vaccine  Completed   Hepatitis C Screening  Completed   HPV VACCINES  Aged Out    Health Maintenance  Health Maintenance Due  Topic Date Due   DTaP/Tdap/Td (2 - Td or Tdap) 09/25/2021   Zoster Vaccines- Shingrix (2 of 2) 10/04/2022    Colorectal cancer screening: Type of screening: Colonoscopy. Completed 12/14/2020. Repeat every 10 years  Mammogram status: Ordered yes. Pt provided with contact info and advised to call to schedule appt.   Bone Density status: Completed 10/22/2022. Results reflect: Bone density results: OSTEOPOROSIS. Repeat every 3 years.  Lung Cancer Screening: (Low Dose CT Chest recommended if Age 15-80 years, 20 pack-year currently smoking OR have quit w/in 15years.) does not qualify.   Lung Cancer Screening Referral: no  Additional Screening:  Hepatitis C Screening: does not qualify; Completed 04/20/2021  Vision Screening: Recommended annual ophthalmology exams for early detection of glaucoma and other disorders of the eye. Is the patient up to date with their annual eye exam?  Yes  Who is the provider or what is the name of the office in which the patient attends annual eye exams? Dr Jama  If pt is not established with a provider, would they like to be referred to a provider to establish care? No .   Dental Screening: Recommended annual dental exams for proper oral hygiene  Diabetic Foot Exam: n/a  Community Resource Referral / Chronic Care Management: CRR required this visit?  No   CCM required this visit?  No    Plan:     I have personally reviewed and noted the following in the patient's chart:   Medical and social history Use of alcohol, tobacco or illicit drugs  Current medications and supplements including opioid prescriptions. Patient is not currently taking opioid prescriptions. Functional ability and status Nutritional  status Physical activity Advanced directives List of other physicians Hospitalizations, surgeries, and ER visits in previous 12 months Vitals Screenings to include cognitive, depression, and falls Referrals and appointments  In addition, I have reviewed and  discussed with patient certain preventive protocols, quality metrics, and best practice recommendations. A written personalized care plan for preventive services as well as general preventive health recommendations were provided to patient.    Erminio LITTIE Saris, LPN   8/89/7974   After Visit Summary: (MyChart) Due to this being a telephonic visit, the after visit summary with patients personalized plan was offered to patient via MyChart   Nurse Notes: The patient states she is doing well and has no concerns or questions at this time.She does relay she has some moles she wants PCP to check at upcoming visit.

## 2023-09-19 NOTE — Patient Instructions (Addendum)
 Brittany [name] , Thank you for taking time to come for your Medicare Wellness Visit. I appreciate your ongoing commitment to your health goals. Please review the following plan we discussed and let me know if I can assist you in the future.   Referrals/Orders/Follow-Ups/Clinician Recommendations:   You have an order for:  []   2D Mammogram  [x]   3D Mammogram  []   Bone Density      Please call for appointment:  [facility] [address] [place], [place] [address] [phone]  [facility] [address] [place], [place] [address] [phone]  [facility] [address] [place], [place] [address] [phone]  [facility] Density [address] [place], [place] [address] [phone]  [facility] [address] [place], [place] [address] [phone]    Make sure to wear two-piece clothing.  No lotions, powders, or deodorants the day of the appointment. Make sure to bring picture ID and insurance card.  Bring list of medications you are currently taking including any supplements.   Schedule your [place] screening mammogram through MyChart!   Log into your MyChart account.  Go to 'Visit' (or 'Appointments' if on mobile App) --> Schedule an Appointment  Under 'Select a Reason for Visit' choose the Mammogram Screening option.  Complete the pre-visit questions and select the time and place that best fits your schedule.      Make sure to wear two-piece clothing.  No lotions, powders, or deodorants the day of the appointment. Make sure to bring picture ID and insurance card.  Bring list of medications you are currently taking including any supplements.   Schedule your [place] screening mammogram through MyChart!   Log into your MyChart account.  Go to 'Visit' (or 'Appointments' if on mobile App) --> Schedule an Appointment  Under  'Select a Reason for Visit' choose the Mammogram Screening option.  Complete the pre-visit questions and select the time and place that best fits your schedule.    This is a list of the screening recommended for you and due dates:  Health Maintenance  Topic Date Due   DTaP/Tdap/Td vaccine (2 - Td or Tdap) 09/25/2021   Zoster (Shingles) Vaccine (2 of 2) 10/04/2022   Mammogram  03/18/2024   Medicare Annual Wellness Visit  09/18/2024   Colon Cancer Screening  12/15/2030   Pneumonia Vaccine  Completed   Flu Shot  Completed   DEXA scan (bone density measurement)  Completed   COVID-19 Vaccine  Completed   Hepatitis C Screening  Completed   HPV Vaccine  Aged Out    Advanced directives: (In Chart) A copy of your advanced directives are scanned into your chart should your provider ever need it.  Next Medicare Annual Wellness Visit scheduled for next year: Yes 09/21/2024 @ 0850 televisit

## 2023-09-21 ENCOUNTER — Other Ambulatory Visit: Payer: Self-pay | Admitting: Family Medicine

## 2023-09-21 DIAGNOSIS — E039 Hypothyroidism, unspecified: Secondary | ICD-10-CM

## 2023-09-21 DIAGNOSIS — E785 Hyperlipidemia, unspecified: Secondary | ICD-10-CM

## 2023-09-21 DIAGNOSIS — R7989 Other specified abnormal findings of blood chemistry: Secondary | ICD-10-CM

## 2023-09-21 DIAGNOSIS — M810 Age-related osteoporosis without current pathological fracture: Secondary | ICD-10-CM

## 2023-09-26 ENCOUNTER — Other Ambulatory Visit (INDEPENDENT_AMBULATORY_CARE_PROVIDER_SITE_OTHER): Payer: Medicare Other

## 2023-09-26 DIAGNOSIS — E039 Hypothyroidism, unspecified: Secondary | ICD-10-CM

## 2023-09-26 DIAGNOSIS — E785 Hyperlipidemia, unspecified: Secondary | ICD-10-CM

## 2023-09-26 DIAGNOSIS — M810 Age-related osteoporosis without current pathological fracture: Secondary | ICD-10-CM

## 2023-09-26 DIAGNOSIS — R7989 Other specified abnormal findings of blood chemistry: Secondary | ICD-10-CM

## 2023-09-26 LAB — TSH: TSH: 1.92 u[IU]/mL (ref 0.35–5.50)

## 2023-09-26 LAB — COMPREHENSIVE METABOLIC PANEL
ALT: 10 U/L (ref 0–35)
AST: 18 U/L (ref 0–37)
Albumin: 4.3 g/dL (ref 3.5–5.2)
Alkaline Phosphatase: 85 U/L (ref 39–117)
BUN: 13 mg/dL (ref 6–23)
CO2: 31 meq/L (ref 19–32)
Calcium: 9.1 mg/dL (ref 8.4–10.5)
Chloride: 103 meq/L (ref 96–112)
Creatinine, Ser: 0.88 mg/dL (ref 0.40–1.20)
GFR: 67.45 mL/min (ref >60.00)
Glucose, Bld: 93 mg/dL (ref 70–99)
Potassium: 4.6 meq/L (ref 3.5–5.1)
Sodium: 141 meq/L (ref 135–145)
Total Bilirubin: 0.7 mg/dL (ref 0.2–1.2)
Total Protein: 6.9 g/dL (ref 6.0–8.3)

## 2023-09-26 LAB — VITAMIN B12: Vitamin B-12: 773 pg/mL (ref 211–911)

## 2023-09-26 LAB — VITAMIN D 25 HYDROXY (VIT D DEFICIENCY, FRACTURES): VITD: 69.4 ng/mL (ref 30.00–100.00)

## 2023-09-26 LAB — LIPID PANEL
Cholesterol: 163 mg/dL (ref 0–200)
HDL: 59.1 mg/dL (ref >39.00)
LDL Cholesterol: 85 mg/dL (ref 0–99)
NonHDL: 103.95
Total CHOL/HDL Ratio: 3
Triglycerides: 97 mg/dL (ref 0.0–149.0)
VLDL: 19.4 mg/dL (ref 0.0–40.0)

## 2023-10-03 ENCOUNTER — Ambulatory Visit (INDEPENDENT_AMBULATORY_CARE_PROVIDER_SITE_OTHER): Payer: Medicare Other | Admitting: Family Medicine

## 2023-10-03 ENCOUNTER — Encounter: Payer: Self-pay | Admitting: Family Medicine

## 2023-10-03 VITALS — BP 136/76 | HR 76 | Temp 98.8°F | Ht 62.5 in | Wt 174.2 lb

## 2023-10-03 DIAGNOSIS — E039 Hypothyroidism, unspecified: Secondary | ICD-10-CM | POA: Diagnosis not present

## 2023-10-03 DIAGNOSIS — R7989 Other specified abnormal findings of blood chemistry: Secondary | ICD-10-CM

## 2023-10-03 DIAGNOSIS — M810 Age-related osteoporosis without current pathological fracture: Secondary | ICD-10-CM | POA: Diagnosis not present

## 2023-10-03 DIAGNOSIS — K219 Gastro-esophageal reflux disease without esophagitis: Secondary | ICD-10-CM | POA: Diagnosis not present

## 2023-10-03 DIAGNOSIS — F331 Major depressive disorder, recurrent, moderate: Secondary | ICD-10-CM

## 2023-10-03 DIAGNOSIS — G4700 Insomnia, unspecified: Secondary | ICD-10-CM

## 2023-10-03 DIAGNOSIS — E785 Hyperlipidemia, unspecified: Secondary | ICD-10-CM

## 2023-10-03 DIAGNOSIS — R002 Palpitations: Secondary | ICD-10-CM

## 2023-10-03 DIAGNOSIS — Z8781 Personal history of (healed) traumatic fracture: Secondary | ICD-10-CM

## 2023-10-03 DIAGNOSIS — Z7189 Other specified counseling: Secondary | ICD-10-CM

## 2023-10-03 DIAGNOSIS — R232 Flushing: Secondary | ICD-10-CM | POA: Diagnosis not present

## 2023-10-03 DIAGNOSIS — I10 Essential (primary) hypertension: Secondary | ICD-10-CM | POA: Diagnosis not present

## 2023-10-03 DIAGNOSIS — H6121 Impacted cerumen, right ear: Secondary | ICD-10-CM

## 2023-10-03 DIAGNOSIS — Z0000 Encounter for general adult medical examination without abnormal findings: Secondary | ICD-10-CM | POA: Diagnosis not present

## 2023-10-03 MED ORDER — OMEPRAZOLE 40 MG PO CPDR: 40.0000 mg | DELAYED_RELEASE_CAPSULE | Freq: Every day | ORAL | 3 refills | Status: AC

## 2023-10-03 MED ORDER — LEVOTHYROXINE SODIUM 88 MCG PO TABS: 88.0000 ug | ORAL_TABLET | Freq: Every day | ORAL | 3 refills | Status: AC

## 2023-10-03 MED ORDER — ATORVASTATIN CALCIUM 20 MG PO TABS: 20.0000 mg | ORAL_TABLET | Freq: Every day | ORAL | 3 refills | Status: DC

## 2023-10-03 MED ORDER — LOSARTAN POTASSIUM 50 MG PO TABS: 50.0000 mg | ORAL_TABLET | Freq: Every day | ORAL | 3 refills | Status: AC

## 2023-10-03 MED ORDER — GABAPENTIN 600 MG PO TABS: 600.0000 mg | ORAL_TABLET | Freq: Every day | ORAL | 3 refills | Status: AC

## 2023-10-03 MED ORDER — VENLAFAXINE HCL ER 75 MG PO CP24: 75.0000 mg | ORAL_CAPSULE | Freq: Every day | ORAL | 3 refills | Status: DC

## 2023-10-03 MED ORDER — FLUTICASONE PROPIONATE 50 MCG/ACT NA SUSP: NASAL | 3 refills | Status: AC

## 2023-10-03 NOTE — Progress Notes (Unsigned)
Ph: [phone] Fax: [phone]   Patient ID: Pricilla Loveless, female    DOB: 1956-05-16, 68 y.o.   MRN: [id number]  This visit was conducted in person.  BP 136/76   Pulse 76   Temp 98.8 F (37.1 C) (Oral)   Ht 5' 2.5" (1.588 m)   Wt 174 lb 4 oz (79 kg)   SpO2 96%   BMI 31.36 kg/m    CC: CPE Subjective:   HPI: Brittany [name] is a 68 y.o. female presenting on 10/03/2023 for Annual Exam [facility] prt 2 [AWV-09/19/23].)   Saw health advisor 09/19/2023 for medicare wellness visit. Note reviewed.    No results found.  Flowsheet Row Clinical Support from 09/19/2023 in [facility] [place]  PHQ-2 Total Score 1          09/19/2023    9:40 AM 05/02/2023   11:41 AM 09/20/2022    8:30 AM 09/18/2021   10:02 AM 06/13/2020   12:08 PM  Fall Risk   Falls in the past year? 0 0 0 0 0  Number falls in past yr: 0 0   0  Injury with Fall? 0 0   0  Risk for fall due to : No Fall Risks No Fall Risks   Medication side effect  Follow up Education provided;Falls prevention discussed Falls evaluation completed   Falls evaluation completed;Falls prevention discussed    Sinusitis treated last month. Notes ongoing muffled hearing and crackles in ears concerned for wax impaction bilaterally, R>L.   Saw cardiology 06/2023 for palpitations off beta-blocker.   Gabapentin 600mg  nightly for back pain and effexor XR 37.5mg  nightly for hot flashes - previously filled by Dr Ethelene Hal but not seeing regularly - requests we take over prescription.   Discussed caregiver stress  Notes worsening anxiety  Notes worsening hot flashes.  Notes R thoracic back pain.    Preventative: Colonoscopy 12/2020 - diverticulosis, hem, rpt 10 yrs ([facility])  Well woman - s/p hysterectomy (benign reason). Doesn't need rpt pap. Ovaries removed (2010).  Mammogram 03/2023 - BiRads1 @ [facility]  DEXA scan 2013 - mild osteopenia T-1.3 at femoral neck, compression fracture L1 while bending over  in shower (fragility fracture).  DEXA 10/2022 - T -2.7 L forearm - declines medication, agrees to handouts on treatment options Lung cancer screening - not eligible Flu shot - yearly COVID vaccine Pfizer 11/2019, 12/2019, booster 06/2020, 12/2020, bivalent 05/2021, 05/2022 RSV - planning to get  Tdap - 09/2011 Prevnar-20 - get today  zostavax - 06/2016 shingrix - 08/2022, states had 2nd - will send [place] date  Advanced directive: scanned 11/2021. Husband Brittany [name] then son Brittany [name] are HCPOA. Ok with organ donation. Doesn't want prolonged life support if terminal condition. Doesn't think would want tube feed. Wants living will followed.  Seat belt use discussed Sunscreen use discussed. Mole on stomach she wants evaluated.  Has seen [facility] Non smoker Alcohol - occasional beer on weekends  Dentist - overdue. Has focused on husband's dental health this year.  Eye exam yearly Bowel - no constipation  Bladder - rare urge incontinence    Caffeine: 2-3 cups/day (1-2 coffee)  Lives with husband and 14yo son, 4 dogs  Activity: yardwork, limited by back pain Diet: good water, fruits/vegetable daily      Relevant past medical, surgical, family and social history reviewed and updated as indicated. Interim medical history since our last visit reviewed. Allergies and medications reviewed and updated. Outpatient  Medications Prior to Visit  Medication Sig Dispense Refill   Ascorbic Acid (VITAMIN C) 1000 MG tablet Take 1,000 mg by mouth daily.     Cholecalciferol (VITAMIN D3) 25 MCG (1000 UT) CAPS Take 1 capsule (1,000 Units total) by mouth daily. 30 capsule    cyanocobalamin (V-R VITAMIN B-12) 500 MCG tablet Take 1 tablet (500 mcg total) by mouth daily.     ibuprofen (ADVIL,MOTRIN) 200 MG tablet Take 600 mg by mouth as needed.     Magnesium 200 MG TABS Take 1 tablet (200 mg total) by mouth daily.     zinc gluconate 50 MG tablet Take 1 tablet (50 mg total) by mouth daily.     atorvastatin  (LIPITOR) 20 MG tablet TAKE 1 TABLET BY MOUTH EVERY DAY 90 tablet 3   fluticasone (FLONASE) 50 MCG/ACT nasal spray SPRAY 2 SPRAYS INTO EACH NOSTRIL EVERY DAY 48 mL 3   gabapentin (NEURONTIN) 600 MG tablet Take 1 tablet (600 mg total) by mouth at bedtime. 90 tablet 4   levothyroxine (SYNTHROID) 88 MCG tablet Take 1 tablet (88 mcg total) by mouth daily before breakfast. 90 tablet 1   losartan (COZAAR) 50 MG tablet Take 1 tablet (50 mg total) by mouth daily. 90 tablet 4   omeprazole (PRILOSEC) 40 MG capsule TAKE 1 CAPSULE BY MOUTH DAILY 100 capsule 2   venlafaxine XR (EFFEXOR-XR) 37.5 MG 24 hr capsule TAKE 1 CAPSULE BY MOUTH DAILY  WITH BREAKFAST 100 capsule 0   amoxicillin-clavulanate (AUGMENTIN) 875-125 MG tablet Take 1 tablet by mouth 2 (two) times daily. 20 tablet 0   No facility-administered medications prior to visit.     Per HPI unless specifically indicated in ROS section below Review of Systems  Constitutional:  Negative for activity change, appetite change, chills, fatigue, fever and unexpected weight change.  HENT:  Negative for hearing loss.   Eyes:  Negative for visual disturbance.  Respiratory:  Negative for cough, chest tightness, shortness of breath and wheezing.   Cardiovascular:  Negative for chest pain, palpitations and leg swelling.  Gastrointestinal:  Negative for abdominal distention, abdominal pain, blood in stool, constipation, diarrhea, nausea and vomiting.  Genitourinary:  Negative for difficulty urinating and hematuria.  Musculoskeletal:  Negative for arthralgias, myalgias and neck pain.  Skin:  Negative for rash.  Neurological:  Negative for dizziness, seizures, syncope and headaches.  Hematological:  Negative for adenopathy. Bruises/bleeds easily.  Psychiatric/Behavioral:  Negative for dysphoric mood. The patient is nervous/anxious.     Objective:  BP 136/76   Pulse 76   Temp 98.8 F (37.1 C) (Oral)   Ht 5' 2.5" (1.588 m)   Wt 174 lb 4 oz (79 kg)   SpO2  96%   BMI 31.36 kg/m   Wt Readings from Last 3 Encounters:  10/03/23 174 lb 4 oz (79 kg)  09/19/23 173 lb (78.5 kg)  08/15/23 177 lb (80.3 kg)      Physical Exam Vitals and nursing note reviewed.  Constitutional:      Appearance: Normal appearance. She is not ill-appearing.  HENT:     Head: Normocephalic and atraumatic.     Right Ear: Tympanic membrane, ear canal and external ear normal. There is impacted cerumen.     Left Ear: Tympanic membrane, ear canal and external ear normal. There is no impacted cerumen.     Ears:     Comments: R cerumen impaction affecting hearing    Mouth/Throat:     Mouth: Mucous membranes are moist.  Pharynx: Oropharynx is clear. No oropharyngeal exudate or posterior oropharyngeal erythema.  Eyes:     General:        Right eye: No discharge.        Left eye: No discharge.     Extraocular Movements: Extraocular movements intact.     Conjunctiva/sclera: Conjunctivae normal.     Pupils: Pupils are equal, round, and reactive to light.  Neck:     Thyroid: No thyroid mass or thyromegaly.     Vascular: No carotid bruit.  Cardiovascular:     Rate and Rhythm: Normal rate and regular rhythm.     Pulses: Normal pulses.     Heart sounds: Normal heart sounds. No murmur heard. Pulmonary:     Effort: Pulmonary effort is normal. No respiratory distress.     Breath sounds: Normal breath sounds. No wheezing, rhonchi or rales.  Abdominal:     General: Bowel sounds are normal. There is no distension.     Palpations: Abdomen is soft. There is no mass.     Tenderness: There is no abdominal tenderness. There is no guarding or rebound.     Hernia: No hernia is present.  Musculoskeletal:     Cervical back: Normal range of motion and neck supple. No rigidity.     Right lower leg: No edema.     Left lower leg: No edema.  Lymphadenopathy:     Cervical: No cervical adenopathy.  Skin:    General: Skin is warm and dry.     Findings: No rash.  Neurological:      General: No focal deficit present.     Mental Status: She is alert. Mental status is at baseline.  Psychiatric:        Mood and Affect: Mood normal.        Behavior: Behavior normal.       Results for orders placed or performed in visit on 09/26/23  VITAMIN D 25 Hydroxy (Vit-D Deficiency, Fractures)   Collection Time: 09/26/23  8:00 AM  Result Value Ref Range   VITD 69.40 30.00 - 100.00 ng/mL  Vitamin B12   Collection Time: 09/26/23  8:00 AM  Result Value Ref Range   Vitamin B-12 773 211 - 911 pg/mL  TSH   Collection Time: 09/26/23  8:00 AM  Result Value Ref Range   TSH 1.92 0.35 - 5.50 uIU/mL  Comprehensive metabolic panel   Collection Time: 09/26/23  8:00 AM  Result Value Ref Range   Sodium 141 135 - 145 mEq/L   Potassium 4.6 3.5 - 5.1 mEq/L   Chloride 103 96 - 112 mEq/L   CO2 31 19 - 32 mEq/L   Glucose, Bld 93 70 - 99 mg/dL   BUN 13 6 - 23 mg/dL   Creatinine, Ser 0.98 0.40 - 1.20 mg/dL   Total Bilirubin 0.7 0.2 - 1.2 mg/dL   Alkaline Phosphatase 85 39 - 117 U/L   AST 18 0 - 37 U/L   ALT 10 0 - 35 U/L   Total Protein 6.9 6.0 - 8.3 g/dL   Albumin 4.3 3.5 - 5.2 g/dL   GFR 11.91 >47.82 mL/min   Calcium 9.1 8.4 - 10.5 mg/dL  Lipid panel   Collection Time: 09/26/23  8:00 AM  Result Value Ref Range   Cholesterol 163 0 - 200 mg/dL   Triglycerides 95.6 0.0 - 149.0 mg/dL   HDL 21.30 >86.57 mg/dL   VLDL 84.6 0.0 - 96.2 mg/dL   LDL Cholesterol 85 0 -  99 mg/dL   Total CHOL/HDL Ratio 3    NonHDL 103.95       09/19/2023    9:44 AM 05/02/2023   11:40 AM 09/20/2022    9:38 AM 09/18/2021   10:18 AM 06/13/2020   12:08 PM  Depression screen PHQ 2/9  Decreased Interest 0 0 2 0 0  Down, Depressed, Hopeless 1 0 0 0 0  PHQ - 2 Score 1 0 2 0 0  Altered sleeping  2 2 3  0  Tired, decreased energy  1 2 3  0  Change in appetite  0 1 0 0  Feeling bad or failure about yourself   0 0 0 0  Trouble concentrating  0 0 0 0  Moving slowly or fidgety/restless  0 0 0 0  Suicidal thoughts  0  0 0 0  PHQ-9 Score  3 7 6  0  Difficult doing work/chores  Not difficult at all Not difficult at all  Not difficult at all       05/02/2023   11:41 AM 09/20/2022    9:38 AM 09/18/2021   10:19 AM  GAD 7 : Generalized Anxiety Score  Nervous, Anxious, on Edge 0 1 0  Control/stop worrying 0 0 0  Worry too much - different things 0 0 0  Trouble relaxing 1 1 0  Restless 0 0 0  Easily annoyed or irritable 0 1 0  Afraid - awful might happen 0 0 0  Total GAD 7 Score 1 3 0  Anxiety Difficulty Not difficult at all Not difficult at all    Assessment & Plan:   Problem List Items Addressed This Visit     Essential hypertension (Chronic)   Chronic, stable on current regimen - continue this.       Relevant Medications   atorvastatin (LIPITOR) 20 MG tablet   losartan (COZAAR) 50 MG tablet   Hyperlipidemia with target LDL less than 100 (Chronic)   Chronic, stable with LDL 85. Continue atorvastatin 20mg  daily. The 10-year ASCVD risk score (Arnett DK, et al., 2019) is: 10.4%   Values used to calculate the score:     Age: 71 years     Sex: Female     Is Non-Hispanic African American: No     Diabetic: No     Tobacco smoker: No     Systolic Blood Pressure: 136 mmHg     Is BP treated: Yes     HDL Cholesterol: 59.1 mg/dL     Total Cholesterol: 163 mg/dL       Relevant Medications   atorvastatin (LIPITOR) 20 MG tablet   losartan (COZAAR) 50 MG tablet   Advanced care planning/counseling discussion (Chronic)   Previously discussed      Palpitations (Chronic)   Followed by cardiology.       Health maintenance examination - Primary (Chronic)   Preventative protocols reviewed and updated unless pt declined. Discussed healthy diet and lifestyle.       MDD (major depressive disorder), recurrent episode, moderate (HCC)   Chronic, discussed stressors. Increase venlafaxine XR to 75mg  daily       Relevant Medications   venlafaxine XR (EFFEXOR-XR) 75 MG 24 hr capsule   GERD  (gastroesophageal reflux disease)   Continue omeprazole 40mg  daily.       Relevant Medications   omeprazole (PRILOSEC) 40 MG capsule   Hypothyroidism   Chronic, stable on levothyroxine daily.       Relevant Medications   levothyroxine (SYNTHROID) 88 MCG  tablet   Hot flashes   Continue gabapentin, trial higher effexor dose.       Relevant Medications   atorvastatin (LIPITOR) 20 MG tablet   losartan (COZAAR) 50 MG tablet   Hearing loss of right ear due to cerumen impaction   S/p irrigation performed today.       History of vertebral compression fracture   H/o this in 2013. See below.      Insomnia   Continue gabapentin 600mg  at night.       Low vitamin B12 level   Stable period on daily replacement.       Osteoporosis   H/o L1 fragility fracture. Latest DEXA reviewed as well as osteoporosis pharmacotherapy including correct administration and side effects/adverse effects.  She is hesitant to start medication. Continue good dietary calcium , regular vitamin D intake, regular weight bearing exercise. Osteoporosis, fosamax, prolia handouts provided.         Meds ordered this encounter  Medications   venlafaxine XR (EFFEXOR-XR) 75 MG 24 hr capsule    Sig: Take 1 capsule (75 mg total) by mouth daily with breakfast.    Dispense:  100 capsule    Refill:  3   atorvastatin (LIPITOR) 20 MG tablet    Sig: Take 1 tablet (20 mg total) by mouth daily.    Dispense:  100 tablet    Refill:  3   fluticasone (FLONASE) 50 MCG/ACT nasal spray    Sig: SPRAY 2 SPRAYS INTO EACH NOSTRIL EVERY DAY    Dispense:  48 mL    Refill:  3   gabapentin (NEURONTIN) 600 MG tablet    Sig: Take 1 tablet (600 mg total) by mouth at bedtime.    Dispense:  100 tablet    Refill:  3   levothyroxine (SYNTHROID) 88 MCG tablet    Sig: Take 1 tablet (88 mcg total) by mouth daily before breakfast.    Dispense:  100 tablet    Refill:  3   losartan (COZAAR) 50 MG tablet    Sig: Take 1  tablet (50 mg total) by mouth daily.    Dispense:  100 tablet    Refill:  3   omeprazole (PRILOSEC) 40 MG capsule    Sig: Take 1 capsule (40 mg total) by mouth daily.    Dispense:  100 capsule    Refill:  3    No orders of the defined types were placed in this encounter.   Patient Instructions  Osteoporosis handout as well as Fosamax and Prolia handouts provided today to look at  Call to schedule dermatology appointment  Increase Effexor to 75mg  daily.  Right ear irrigation today.  Good to see you today  Return as needed or in 6 months for next physical.  Follow up plan: Return in about 6 months (around 04/01/2024) for follow up visit.  Eustaquio Boyden, MD

## 2023-10-03 NOTE — Patient Instructions (Addendum)
Osteoporosis handout as well as Fosamax and Prolia handouts provided today to look at  Call to schedule dermatology appointment  Increase Effexor to 75mg  daily.  Right ear irrigation today.  Good to see you today  Return as needed or in 6 months for next physical.

## 2023-10-03 NOTE — Assessment & Plan Note (Signed)
Preventative protocols reviewed and updated unless pt declined. Discussed healthy diet and lifestyle.

## 2023-10-03 NOTE — Assessment & Plan Note (Addendum)
Previously discussed.

## 2023-10-04 ENCOUNTER — Other Ambulatory Visit: Payer: Self-pay | Admitting: Family Medicine

## 2023-10-04 ENCOUNTER — Encounter: Payer: Self-pay | Admitting: Family Medicine

## 2023-10-04 NOTE — Assessment & Plan Note (Signed)
Continue gabapentin, trial higher effexor dose.

## 2023-10-04 NOTE — Assessment & Plan Note (Signed)
Chronic, stable with LDL 85. Continue atorvastatin 20mg  daily. The 10-year ASCVD risk score (Arnett DK, et al., 2019) is: 10.4%   Values used to calculate the score:     Age: 68 years     Sex: Female     Is Non-Hispanic African American: No     Diabetic: No     Tobacco smoker: No     Systolic Blood Pressure: 136 mmHg     Is BP treated: Yes     HDL Cholesterol: 59.1 mg/dL     Total Cholesterol: 163 mg/dL

## 2023-10-04 NOTE — Assessment & Plan Note (Addendum)
Continue gabapentin 600 mg at night

## 2023-10-04 NOTE — Assessment & Plan Note (Deleted)
Encourage healthy diet choices.

## 2023-10-04 NOTE — Assessment & Plan Note (Signed)
H/o this in 2013. See below.

## 2023-10-04 NOTE — Assessment & Plan Note (Signed)
Chronic, discussed stressors. Increase venlafaxine XR to 75mg  daily

## 2023-10-04 NOTE — Assessment & Plan Note (Signed)
Stable period on daily replacement

## 2023-10-04 NOTE — Assessment & Plan Note (Signed)
Chronic, stable on levothyroxine daily.

## 2023-10-04 NOTE — Assessment & Plan Note (Signed)
Chronic, stable on current regimen - continue this.

## 2023-10-04 NOTE — Assessment & Plan Note (Signed)
S/p irrigation performed today.

## 2023-10-04 NOTE — Assessment & Plan Note (Signed)
H/o L1 fragility fracture. Latest DEXA reviewed as well as osteoporosis pharmacotherapy including correct administration and side effects/adverse effects.  She is hesitant to start medication. Continue good dietary calcium , regular vitamin D intake, regular weight bearing exercise. Osteoporosis, fosamax, prolia handouts provided.

## 2023-10-04 NOTE — Assessment & Plan Note (Signed)
Followed by cardiology

## 2023-10-04 NOTE — Assessment & Plan Note (Signed)
Continue omeprazole 40 mg daily

## 2023-12-16 DIAGNOSIS — M25562 Pain in left knee: Secondary | ICD-10-CM | POA: Diagnosis not present

## 2023-12-16 DIAGNOSIS — M25561 Pain in right knee: Secondary | ICD-10-CM | POA: Diagnosis not present

## 2023-12-28 ENCOUNTER — Other Ambulatory Visit: Payer: Self-pay | Admitting: Family Medicine

## 2023-12-31 NOTE — Telephone Encounter (Signed)
 Too soon. Rx sent 10/03/23, #100/3 refills to [facility] mail order pharmacy.   Request denied.

## 2024-03-19 ENCOUNTER — Ambulatory Visit
Admission: RE | Admit: 2024-03-19 | Discharge: 2024-03-19 | Disposition: A | Discharge status: Home / Self Care | Source: Ambulatory Visit | Attending: Family Medicine | Admitting: Family Medicine

## 2024-03-19 DIAGNOSIS — Z1231 Encounter for screening mammogram for malignant neoplasm of breast: Secondary | ICD-10-CM | POA: Diagnosis not present

## 2024-03-22 ENCOUNTER — Ambulatory Visit

## 2024-03-24 ENCOUNTER — Ambulatory Visit

## 2024-03-25 ENCOUNTER — Ambulatory Visit: Payer: Self-pay | Admitting: Family Medicine

## 2024-04-07 ENCOUNTER — Ambulatory Visit: Payer: Medicare Other | Admitting: Family Medicine

## 2024-05-19 DIAGNOSIS — M5459 Other low back pain: Secondary | ICD-10-CM | POA: Diagnosis not present

## 2024-05-19 DIAGNOSIS — M542 Cervicalgia: Secondary | ICD-10-CM | POA: Diagnosis not present

## 2024-05-27 DIAGNOSIS — M542 Cervicalgia: Secondary | ICD-10-CM | POA: Diagnosis not present

## 2024-06-03 ENCOUNTER — Encounter: Payer: Self-pay | Admitting: Family Medicine

## 2024-06-11 DIAGNOSIS — M542 Cervicalgia: Secondary | ICD-10-CM | POA: Diagnosis not present

## 2024-09-01 ENCOUNTER — Other Ambulatory Visit: Payer: Self-pay | Admitting: Family Medicine

## 2024-09-04 NOTE — Telephone Encounter (Signed)
 ERx Plz schedule CPE after 10/02/2024

## 2024-09-06 NOTE — Telephone Encounter (Signed)
 I called and patient has been scheduled.

## 2024-09-08 ENCOUNTER — Ambulatory Visit (INDEPENDENT_AMBULATORY_CARE_PROVIDER_SITE_OTHER): Admitting: Family Medicine

## 2024-09-08 ENCOUNTER — Encounter: Payer: Self-pay | Admitting: Family Medicine

## 2024-09-08 VITALS — BP 140/82 | HR 79 | Temp 98.2°F | Ht 62.5 in | Wt 180.8 lb

## 2024-09-08 DIAGNOSIS — L989 Disorder of the skin and subcutaneous tissue, unspecified: Secondary | ICD-10-CM | POA: Diagnosis not present

## 2024-09-08 MED ORDER — DOXYCYCLINE HYCLATE 100 MG PO TABS: 100.0000 mg | ORAL_TABLET | Freq: Two times a day (BID) | ORAL | 0 refills | Status: DC

## 2024-09-08 MED ORDER — MILK THISTLE PO CAPS: 1.0000 | ORAL_CAPSULE | Freq: Every day | ORAL | Status: AC

## 2024-09-08 MED ORDER — BEET ROOT 500 MG PO CAPS: 1.0000 | ORAL_CAPSULE | Freq: Every day | ORAL | Status: AC

## 2024-09-08 NOTE — Assessment & Plan Note (Signed)
 Suspect possible blocked tear duct to right eye - rec warm compresses and gentle massage. Update if not improving with treatment.  Suspect small furuncle to back, present for over a month. Rec warm compresses and Rx doxycycline  1 wk course. Update if not improving with treatment.

## 2024-09-08 NOTE — Patient Instructions (Signed)
 Gentle massage to tear duct area with warm compress, wash hands regularly.  Warm compress to skin lesion on back.  Take doxycycline  antibiotic twice daily for 1 week.  Let me know if not improving with this.

## 2024-09-08 NOTE — Progress Notes (Signed)
 " Ph: [phone] Fax: [phone]   Patient ID: Brittany [name] Payor, female    DOB: Apr 13, 1956, 68 y.o.   MRN: [id number]  This visit was conducted in person.  BP (!) 140/82 (Cuff Size: Normal) Comment: pt forgot meds today  Pulse 79   Temp 98.2 F (36.8 C) (Oral)   Ht 5' 2.5 (1.588 m)   Wt 180 lb 12.8 oz (82 kg)   SpO2 98%   BMI 32.54 kg/m   Forgot to take losartan  today   CC: check skin cysts  Subjective:   HPI: Brittany [name] is a 68 y.o. female presenting on 09/08/2024 for Acute Visit (Pt has growth towards inner eye and towards lower spine. Spine - onset 1 month, eye onset 1 week ./Pt states she has had one similar that popped up in her nose and ear but they went away with peroxide within the week)   2 months ago developed tender sore sore to right inner nare - treated with peroxide with benefit.  Then developed tender bump to left external ear canal. This also resolved after about 2 weeks.   2 months ago developed skin lesion to right lower back ?pimple. It persists. Very painful to touch.   1 wk ago developed skin lesion to corner of right inner eye near tear duct. Mildly tender.   No fevers/chills No new lotions ,detergents, soaps or shampoos No new medicines, OTC supplements. No new foods.   No known h/o MRSA.      Relevant past medical, surgical, family and social history reviewed and updated as indicated. Interim medical history since our last visit reviewed. Allergies and medications reviewed and updated. Outpatient Medications Prior to Visit  Medication Sig Dispense Refill   Ascorbic Acid (VITAMIN C) 1000 MG tablet Take 1,000 mg by mouth daily.     atorvastatin  (LIPITOR) 20 MG tablet Take 1 tablet (20 mg total) by mouth daily. 100 tablet 3   Cholecalciferol  (VITAMIN D3) 25 MCG (1000 UT) CAPS Take 1 capsule (1,000 Units total) by mouth daily. 30 capsule    cyanocobalamin  (V-R VITAMIN B-12) 500 MCG tablet Take 1 tablet (500 mcg total) by mouth daily.      fluticasone  (FLONASE ) 50 MCG/ACT nasal spray SPRAY 2 SPRAYS INTO EACH NOSTRIL EVERY DAY (Patient taking differently: 2 (two) times daily as needed. SPRAY 2 SPRAYS INTO EACH NOSTRIL EVERY DAY) 48 mL 3   gabapentin  (NEURONTIN ) 600 MG tablet Take 1 tablet (600 mg total) by mouth at bedtime. 100 tablet 3   ibuprofen (ADVIL,MOTRIN) 200 MG tablet Take 600 mg by mouth as needed.     levothyroxine  (SYNTHROID ) 88 MCG tablet Take 1 tablet (88 mcg total) by mouth daily before breakfast. 100 tablet 3   losartan  (COZAAR ) 50 MG tablet Take 1 tablet (50 mg total) by mouth daily. 100 tablet 3   Magnesium  200 MG TABS Take 1 tablet (200 mg total) by mouth daily.     omeprazole  (PRILOSEC) 40 MG capsule Take 1 capsule (40 mg total) by mouth daily. 100 capsule 3   venlafaxine  XR (EFFEXOR -XR) 75 MG 24 hr capsule TAKE 1 CAPSULE BY MOUTH DAILY  WITH BREAKFAST 100 capsule 3   zinc  gluconate 50 MG tablet Take 1 tablet (50 mg total) by mouth daily.     No facility-administered medications prior to visit.     Per HPI unless specifically indicated in ROS section below Review of Systems  Objective:  BP (!) 140/82 (Cuff Size: Normal) Comment: pt forgot  meds today  Pulse 79   Temp 98.2 F (36.8 C) (Oral)   Ht 5' 2.5 (1.588 m)   Wt 180 lb 12.8 oz (82 kg)   SpO2 98%   BMI 32.54 kg/m   Wt Readings from Last 3 Encounters:  09/08/24 180 lb 12.8 oz (82 kg)  10/03/23 174 lb 4 oz (79 kg)  09/19/23 173 lb (78.5 kg)      Physical Exam Vitals and nursing note reviewed.  Constitutional:      Appearance: Normal appearance. She is not ill-appearing.  HENT:     Head: Normocephalic and atraumatic.     Right Ear: Ear canal and external ear normal.     Left Ear: Ear canal and external ear normal.     Nose: Nose normal. No congestion or rhinorrhea.     Comments: No residual lesions noted     Mouth/Throat:     Mouth: Mucous membranes are moist.     Pharynx: Oropharynx is clear. No oropharyngeal exudate or posterior  oropharyngeal erythema.  Eyes:     General: Lids are normal.     Extraocular Movements: Extraocular movements intact.     Pupils: Pupils are equal, round, and reactive to light.      Comments: Small bump to inner right eye near tear duct  Skin:    General: Skin is warm and dry.     Findings: Lesion present. No rash.         Comments: Small furuncle to mid lower back without significant surrounding erythema   Neurological:     Mental Status: She is alert.  Psychiatric:        Mood and Affect: Mood normal.        Behavior: Behavior normal.       Results for orders placed or performed in visit on 09/26/23  VITAMIN D  25 Hydroxy (Vit-D Deficiency, Fractures)   Collection Time: 09/26/23  8:00 AM  Result Value Ref Range   VITD 69.40 30.00 - 100.00 ng/mL  Vitamin B12   Collection Time: 09/26/23  8:00 AM  Result Value Ref Range   Vitamin B-12 773 211 - 911 pg/mL  TSH   Collection Time: 09/26/23  8:00 AM  Result Value Ref Range   TSH 1.92 0.35 - 5.50 uIU/mL  Comprehensive metabolic panel   Collection Time: 09/26/23  8:00 AM  Result Value Ref Range   Sodium 141 135 - 145 mEq/L   Potassium 4.6 3.5 - 5.1 mEq/L   Chloride 103 96 - 112 mEq/L   CO2 31 19 - 32 mEq/L   Glucose, Bld 93 70 - 99 mg/dL   BUN 13 6 - 23 mg/dL   Creatinine, Ser 9.11 0.40 - 1.20 mg/dL   Total Bilirubin 0.7 0.2 - 1.2 mg/dL   Alkaline Phosphatase 85 39 - 117 U/L   AST 18 0 - 37 U/L   ALT 10 0 - 35 U/L   Total Protein 6.9 6.0 - 8.3 g/dL   Albumin 4.3 3.5 - 5.2 g/dL   GFR 32.54 >39.99 mL/min   Calcium  9.1 8.4 - 10.5 mg/dL  Lipid panel   Collection Time: 09/26/23  8:00 AM  Result Value Ref Range   Cholesterol 163 0 - 200 mg/dL   Triglycerides 02.9 0.0 - 149.0 mg/dL   HDL 40.89 >60.99 mg/dL   VLDL 80.5 0.0 - 59.9 mg/dL   LDL Cholesterol 85 0 - 99 mg/dL   Total CHOL/HDL Ratio 3    NonHDL 103.95  Assessment & Plan:   Problem List Items Addressed This Visit     Bumps on skin - Primary   Suspect  possible blocked tear duct to right eye - rec warm compresses and gentle massage. Update if not improving with treatment.  Suspect small furuncle to back, present for over a month. Rec warm compresses and Rx doxycycline  1 wk course. Update if not improving with treatment.         Meds ordered this encounter  Medications   Misc Natural Products (BEET ROOT) 500 MG CAPS    Sig: Take 1 capsule by mouth daily.   Misc Natural Products (MILK THISTLE) CAPS    Sig: Take 1 capsule by mouth daily.   doxycycline  (VIBRA -TABS) 100 MG tablet    Sig: Take 1 tablet (100 mg total) by mouth 2 (two) times daily.    Dispense:  14 tablet    Refill:  0    No orders of the defined types were placed in this encounter.   Patient Instructions  Gentle massage to tear duct area with warm compress, wash hands regularly.  Warm compress to skin lesion on back.  Take doxycycline  antibiotic twice daily for 1 week.  Let me know if not improving with this.   Follow up plan: No follow-ups on file.  Anton Blas, MD   "

## 2024-09-21 ENCOUNTER — Ambulatory Visit: Payer: Medicare Other

## 2024-09-21 VITALS — Ht 62.5 in | Wt 180.0 lb

## 2024-09-21 DIAGNOSIS — Z0000 Encounter for general adult medical examination without abnormal findings: Secondary | ICD-10-CM

## 2024-09-21 NOTE — Progress Notes (Signed)
 "  Chief Complaint  Patient presents with   Medicare Wellness     Subjective:   Brittany [name] is a 69 y.o. female who presents for a Medicare Annual Wellness Visit.  Visit info / Clinical Intake: Medicare Wellness Visit Type:: Subsequent Annual Wellness Visit Persons participating in visit and providing information:: patient Medicare Wellness Visit Mode:: Telephone If telephone:: video declined Since this visit was completed virtually, some vitals may be partially provided or unavailable. Missing vitals are due to the limitations of the virtual format.: Unable to obtain vitals - no equipment If Telephone or Video please confirm:: I connected with patient using audio/video enable telemedicine. I verified patient identity with two identifiers, discussed telehealth limitations, and patient agreed to proceed. Patient Location:: home Provider Location:: office/clinic Interpreter Needed?: No Pre-visit prep was completed: yes AWV questionnaire completed by patient prior to visit?: no Living arrangements:: lives with spouse/significant other Patient's Overall Health Status Rating: good Typical amount of pain: some Does pain affect daily life?: no Are you currently prescribed opioids?: no  Dietary Habits and Nutritional Risks How many meals a day?: 2 Eats fruit and vegetables daily?: yes Most meals are obtained by: preparing own meals In the last 2 weeks, have you had any of the following?: none Diabetic:: no  Functional Status Activities of Daily Living (to include ambulation/medication): Independent Ambulation: Independent Medication Administration: Independent Home Management (perform basic housework or laundry): Independent Manage your own finances?: yes Primary transportation is: driving Concerns about vision?: no *vision screening is required for WTM* Concerns about hearing?: no  Fall Screening Falls in the past year?: 0 Number of falls in past year: 0 Was there an  injury with Fall?: 0 Fall Risk Category Calculator: 0 Patient Fall Risk Level: Low Fall Risk  Fall Risk Patient at Risk for Falls Due to: No Fall Risks Fall risk Follow up: Falls evaluation completed; Education provided; Falls prevention discussed  Home and Transportation Safety: All rugs have non-skid backing?: yes All stairs or steps have railings?: yes Grab bars in the bathtub or shower?: (!) no Have non-skid surface in bathtub or shower?: yes Good home lighting?: yes Regular seat belt use?: yes Hospital stays in the last year:: no  Cognitive Assessment Difficulty concentrating, remembering, or making decisions? : no Will 6CIT or Mini Cog be Completed: yes What year is it?: 0 points What month is it?: 0 points Give patient an address phrase to remember (5 components): [address] [place]  About what time is it?: 0 points Count backwards from 20 to 1: 0 points Say the months of the year in reverse: 0 points Repeat the address phrase from earlier: 0 points 6 CIT Score: 0 points  Advance Directives (For Healthcare) Does Patient Have a Medical Advance Directive?: Yes Does patient want to make changes to medical advance directive?: No - Patient declined Type of Advance Directive: Healthcare Power of [place]; Living will Copy of Healthcare Power of Attorney in Chart?: Yes - validated most recent copy scanned in chart (See row information) Copy of Living Will in Chart?: Yes - validated most recent copy scanned in chart (See row information)  Reviewed/Updated  Reviewed/Updated: Reviewed All (Medical, Surgical, Family, Medications, Allergies, Care Teams, Patient Goals)    Allergies (verified) Cymbalta [duloxetine hcl], Short ragweed pollen ext, and Percocet [oxycodone -acetaminophen ]   Current Medications (verified) Outpatient Encounter Medications as of 09/21/2024  Medication Sig   Ascorbic Acid (VITAMIN C) 1000 MG tablet Take 1,000 mg by mouth daily.    atorvastatin  (  LIPITOR) 20 MG tablet Take 1 tablet (20 mg total) by mouth daily.   Cholecalciferol  (VITAMIN D3) 25 MCG (1000 UT) CAPS Take 1 capsule (1,000 Units total) by mouth daily.   cyanocobalamin  (V-R VITAMIN B-12) 500 MCG tablet Take 1 tablet (500 mcg total) by mouth daily.   doxycycline  (VIBRA -TABS) 100 MG tablet Take 1 tablet (100 mg total) by mouth 2 (two) times daily.   fluticasone  (FLONASE ) 50 MCG/ACT nasal spray SPRAY 2 SPRAYS INTO EACH NOSTRIL EVERY DAY (Patient taking differently: 2 (two) times daily as needed. SPRAY 2 SPRAYS INTO EACH NOSTRIL EVERY DAY)   gabapentin  (NEURONTIN ) 600 MG tablet Take 1 tablet (600 mg total) by mouth at bedtime.   ibuprofen (ADVIL,MOTRIN) 200 MG tablet Take 600 mg by mouth as needed.   levothyroxine  (SYNTHROID ) 88 MCG tablet Take 1 tablet (88 mcg total) by mouth daily before breakfast.   losartan  (COZAAR ) 50 MG tablet Take 1 tablet (50 mg total) by mouth daily.   Magnesium  200 MG TABS Take 1 tablet (200 mg total) by mouth daily.   Misc Natural Products (BEET ROOT) 500 MG CAPS Take 1 capsule by mouth daily.   Misc Natural Products (MILK THISTLE) CAPS Take 1 capsule by mouth daily.   omeprazole  (PRILOSEC) 40 MG capsule Take 1 capsule (40 mg total) by mouth daily.   venlafaxine  XR (EFFEXOR -XR) 75 MG 24 hr capsule TAKE 1 CAPSULE BY MOUTH DAILY  WITH BREAKFAST   zinc  gluconate 50 MG tablet Take 1 tablet (50 mg total) by mouth daily.   No facility-administered encounter medications on file as of 09/21/2024.    History: Past Medical History:  Diagnosis Date   Allergy    Years   Arthritis    neck pain and numbness left digits Humphrey), lower spine and knees   Chronic lower back pain    persistent after L1 cmp fx, with multilevel mild lumbar DDD - good resolution after L L5/S1 ESI ([facility])   Colon polyps    last colonoscopy 2006? rpt due   Diverticulitis    Fatty liver 02/2012   on CT scan 02/2012   GERD (gastroesophageal reflux disease)    History of  diverticulitis of colon    s/p resection of large intestine, rpt itis 02/2012   HLD (hyperlipidemia)    HTN (hypertension)    Hypothyroidism    Migraines    occasional, stress related   OSA (obstructive sleep apnea) 09/2016   mild to moderate, consider CPAP   Postmenopausal    HRT compound, previously premarin   Seasonal allergies    Vertebral compression fracture (HCC) 07/2012   L1 25% Humphrey) released without intervention   Past Surgical History:  Procedure Laterality Date   ABDOMINAL HYSTERECTOMY  2008   ANTERIOR FUSION CERVICAL SPINE  01/1999   cervical HNP   COLON RESECTION  2008   2 ft removed, diverticulitis   COLON SURGERY  2004   COLONOSCOPY  03/2012   partial colectomy, scattered diverticulae Octavia), rpt 10 yrs   COLONOSCOPY  12/2020   diverticulosis, hem, rpt 10 yrs ([facility])   COSMETIC SURGERY     Eye lids   ESI Left 06/2013, 12/2013, 09/2014   L5/S1 with good resolution of pain   ESOPHAGOGASTRODUODENOSCOPY  03/2012   mod gastritis, benign polyps, H pylori neg   ESOPHAGOGASTRODUODENOSCOPY  12/2020   non-obstructive schatzki ring, 3cm HH, gastric polyps ([facility]ouraty)   FOOT SURGERY Left 01/2014   big toe fusion   KNEE SURGERY  1995, 2011  torn menisci (MRI 2010 L)   SPINE SURGERY  01/1999   SPIROMETRY  2011   normal   TONSILLECTOMY  1964   TOTAL ABDOMINAL HYSTERECTOMY W/ BILATERAL SALPINGOOPHORECTOMY  06/2009   vag bleeding   UPPER GASTROINTESTINAL ENDOSCOPY     US  ECHOCARDIOGRAPHY  08/2010   normal, EF 63%, mild valvular issues   Family History  Problem Relation Age of Onset   Coronary artery disease Mother 79       CABG and valve replacement   Arthritis Mother    Valvular heart disease Mother        Had valve replacement   Heart disease Mother    Vision loss Mother    Coronary artery disease Father 30       CAD/MI   Hypertension Father    Heart disease Father    Diabetes Sister    Hypertension Sister    Hypertension Brother    Coronary  artery disease Brother        Stent   Heart disease Brother    Coronary artery disease Brother        Stents   Hypertension Brother    Heart disease Brother    Hypertension Sister    Mitral valve prolapse Sister    Arthritis Sister    Hypertension Sister    Thyroid  disease Sister    Colon polyps Sister    Irritable bowel syndrome Sister    Diabetes Sister    Arthritis Sister    Diabetes Maternal Grandfather    Thyroid  disease Son    Cancer Neg Hx    Colon cancer Neg Hx    Esophageal cancer Neg Hx    Inflammatory bowel disease Neg Hx    Liver disease Neg Hx    Pancreatic cancer Neg Hx    Rectal cancer Neg Hx    Stomach cancer Neg Hx    Social History   Occupational History   Occupation: retired    Associate Professor: NOT EMPLOYED  Tobacco Use   Smoking status: Never   Smokeless tobacco: Never  Vaping Use   Vaping status: Never Used  Substance and Sexual Activity   Alcohol use: Yes    Alcohol/week: 2.0 standard drinks of alcohol    Types: 2 Cans of beer per week    Comment: Occasionally with dinner   Drug use: No   Sexual activity: Yes    Birth control/protection: Surgical, Post-menopausal    Comment: Hysterectomy   Tobacco Counseling Counseling given: Not Answered  SDOH Screenings   Food Insecurity: No Food Insecurity (09/21/2024)  Housing: Low Risk (09/21/2024)  Transportation Needs: No Transportation Needs (09/21/2024)  Utilities: Not At Risk (09/21/2024)  Alcohol Screen: Low Risk (09/07/2024)  Depression (PHQ2-9): Low Risk (09/21/2024)  Financial Resource Strain: Low Risk (09/07/2024)  Physical Activity: Inactive (09/21/2024)  Social Connections: Moderately Isolated (09/21/2024)  Stress: No Stress Concern Present (09/21/2024)  Recent Concern: Stress - Stress Concern Present (09/07/2024)  Tobacco Use: Low Risk (09/21/2024)  Health Literacy: Adequate Health Literacy (09/21/2024)   See flowsheets for full screening details  Depression Screen PHQ 2 & 9 Depression Scale-  Over the past 2 weeks, how often have you been bothered by any of the following problems? Little interest or pleasure in doing things: 0 Feeling down, depressed, or hopeless (PHQ Adolescent also includes...irritable): 0 PHQ-2 Total Score: 0 Trouble falling or staying asleep, or sleeping too much: 2 Feeling tired or having little energy: 2 Poor appetite or overeating (PHQ Adolescent also includes...weight  loss): 0 Feeling bad about yourself - or that you are a failure or have let yourself or your family down: 0 Trouble concentrating on things, such as reading the newspaper or watching television (PHQ Adolescent also includes...like school work): 0 Moving or speaking so slowly that other people could have noticed. Or the opposite - being so fidgety or restless that you have been moving around a lot more than usual: 0 Thoughts that you would be better off dead, or of hurting yourself in some way: 0 PHQ-9 Total Score: 4 If you checked off any problems, how difficult have these problems made it for you to do your work, take care of things at home, or get along with other people?: Not difficult at all     Goals Addressed             This Visit's Progress    Weight (lb) < 200 lb (90.7 kg)   180 lb (81.6 kg)    I want tlose some weight (want to discuss options w/PCP)             Objective:    Today's Vitals   09/21/24 0857  Weight: 180 lb (81.6 kg)  Height: 5' 2.5 (1.588 m)   Body mass index is 32.4 kg/m.  Hearing/Vision screen Vision Screening - Comments:: UTD w/visits to Dr Lucillie Immunizations and Health Maintenance Health Maintenance  Topic Date Due   DTaP/Tdap/Td (2 - Td or Tdap) 09/25/2021   Zoster Vaccines- Shingrix (2 of 2) 10/04/2022   COVID-19 Vaccine (7 - 2025-26 season) 05/10/2024   Medicare Annual Wellness (AWV)  09/18/2024   Mammogram  03/19/2025   Colonoscopy  12/15/2030   Pneumococcal Vaccine: 50+ Years  Completed   Influenza Vaccine  Completed   Bone  Density Scan  Completed   Hepatitis C Screening  Completed   Meningococcal B Vaccine  Aged Out        Assessment/Plan:  This is a routine wellness examination for Kendyl.  Patient Care Team: Rilla Baller, MD as PCP - General (Family Medicine) Anner Alm ORN, MD as PCP - Cardiology (Cardiology) Faye Lauraine PARAS, FNP (Family Medicine) Ladora Ross Lacy Phebe, MD as Referring Physician [facility])  I have personally reviewed and noted the following in the patients chart:   Medical and social history Use of alcohol, tobacco or illicit drugs  Current medications and supplements including opioid prescriptions. Functional ability and status Nutritional status Physical activity Advanced directives List of other physicians Hospitalizations, surgeries, and ER visits in previous 12 months Vitals Screenings to include cognitive, depression, and falls Referrals and appointments  No orders of the defined types were placed in this encounter.  In addition, I have reviewed and discussed with patient certain preventive protocols, quality metrics, and best practice recommendations. A written personalized care plan for preventive services as well as general preventive health recommendations were provided to patient.   Erminio LITTIE Saris, LPN   [id number]/[id number]   No follow-ups on file.  After Visit Summary: (MyChart) Due to this being a telephonic visit, the after visit summary with patients personalized plan was offered to patient via MyChart   Nurse Notes: No voiced or noted concerns at this time Patient advised to keep follow-up appointment with PCP (10/26/24) Appointment(s) made: (AWV/CPE Feb 2027) Vaccines not given: Will obtain Covid at pharmacy Pt sts she had Shingles vaccine at the pharmacy  "

## 2024-09-21 NOTE — Patient Instructions (Signed)
 Ms. Macari,  Thank you for taking the time for your Medicare Wellness Visit. I appreciate your continued commitment to your health goals. Please review the care plan we discussed, and feel free to reach out if I can assist you further.  Please note that Annual Wellness Visits do not include a physical exam. Some assessments may be limited, especially if the visit was conducted virtually. If needed, we may recommend an in-person follow-up with your provider.  Ongoing Care Seeing your primary care provider every 3 to 6 months helps us  monitor your health and provide consistent, personalized care.   Referrals If a referral was made during today's visit and you haven't received any updates within two weeks, please contact the referred provider directly to check on the status.  Recommended Screenings:  Health Maintenance  Topic Date Due   DTaP/Tdap/Td vaccine (2 - Td or Tdap) 09/25/2021   Zoster (Shingles) Vaccine (2 of 2) 10/04/2022   COVID-19 Vaccine (7 - 2025-26 season) 05/10/2024   Medicare Annual Wellness Visit  09/18/2024   Breast Cancer Screening  03/19/2025   Colon Cancer Screening  12/15/2030   Pneumococcal Vaccine for age over 15  Completed   Flu Shot  Completed   Osteoporosis screening with Bone Density Scan  Completed   Hepatitis C Screening  Completed   Meningitis B Vaccine  Aged Out       09/21/2024    8:58 AM  Advanced Directives  Does Patient Have a Medical Advance Directive? Yes  Type of Estate Agent of [place];Living will  Does patient want to make changes to medical advance directive? No - Patient declined  Copy of Healthcare Power of Attorney in Chart? Yes - validated most recent copy scanned in chart (See row information)    Vision: Annual vision screenings are recommended for early detection of glaucoma, cataracts, and diabetic retinopathy. These exams can also reveal signs of chronic conditions such as diabetes and high blood  pressure.  Dental: Annual dental screenings help detect early signs of oral cancer, gum disease, and other conditions linked to overall health, including heart disease and diabetes.  Please see the attached documents for additional preventive care recommendations.

## 2024-10-05 ENCOUNTER — Ambulatory Visit (INDEPENDENT_AMBULATORY_CARE_PROVIDER_SITE_OTHER): Admitting: Family Medicine

## 2024-10-05 ENCOUNTER — Encounter: Payer: Self-pay | Admitting: Family Medicine

## 2024-10-05 VITALS — BP 130/76 | HR 88 | Temp 98.6°F | Ht 62.5 in | Wt 182.4 lb

## 2024-10-05 DIAGNOSIS — R8290 Unspecified abnormal findings in urine: Secondary | ICD-10-CM | POA: Diagnosis not present

## 2024-10-05 DIAGNOSIS — R509 Fever, unspecified: Secondary | ICD-10-CM

## 2024-10-05 DIAGNOSIS — R300 Dysuria: Secondary | ICD-10-CM | POA: Diagnosis not present

## 2024-10-05 DIAGNOSIS — B9789 Other viral agents as the cause of diseases classified elsewhere: Secondary | ICD-10-CM | POA: Diagnosis not present

## 2024-10-05 DIAGNOSIS — J019 Acute sinusitis, unspecified: Secondary | ICD-10-CM

## 2024-10-05 DIAGNOSIS — N3000 Acute cystitis without hematuria: Secondary | ICD-10-CM | POA: Diagnosis not present

## 2024-10-05 LAB — POC URINALSYSI DIPSTICK (AUTOMATED)
Bilirubin, UA: NEGATIVE
Blood, UA: NEGATIVE
Glucose, UA: NEGATIVE
Ketones, UA: NEGATIVE
Nitrite, UA: NEGATIVE
Protein, UA: NEGATIVE
Spec Grav, UA: 1.015
Urobilinogen, UA: 0.2 U/dL
pH, UA: 6

## 2024-10-05 LAB — POCT INFLUENZA A/B
Influenza A, POC: NEGATIVE
Influenza B, POC: NEGATIVE

## 2024-10-05 LAB — POC COVID19 BINAXNOW: SARS Coronavirus 2 Ag: NEGATIVE

## 2024-10-05 MED ORDER — AMOXICILLIN-POT CLAVULANATE 875-125 MG PO TABS: 1.0000 | ORAL_TABLET | Freq: Two times a day (BID) | ORAL | 0 refills | Status: AC

## 2024-10-05 NOTE — Assessment & Plan Note (Signed)
 Anticipate viral given short duration. Rx augmentin  for cystitis as per below.  Further supportive measures reviewed. Update if not improving with symptoms.

## 2024-10-05 NOTE — Patient Instructions (Signed)
 VISIT SUMMARY: During your visit, we discussed your symptoms of a possible urinary tract infection and respiratory congestion. You reported cloudy, malodorous urine with urgency and frequency, as well as respiratory symptoms including congestion, dry cough, headache, and postnasal drip. We reviewed your recent history and current symptoms to determine the best course of action.  YOUR PLAN: -ACUTE CYSTITIS: Acute cystitis is a bacterial infection of the bladder. We have ordered a urine culture to confirm the infection and prescribed Augmentin  to treat it. You should stop taking Azo after one more day and avoid bladder irritants such as caffeine, alcohol, and spicy foods.  -ACUTE SINUSITIS: Acute sinusitis is an inflammation of the sinuses, often due to a viral infection. We have prescribed Augmentin  to treat any potential bacterial infection if symptoms persist beyond 7-10 days. In the meantime, increase your fluid intake, get plenty of rest, and take Tylenol  for discomfort. Avoid using DayQuil as it may increase blood pressure.  INSTRUCTIONS: Please follow up with us  if your symptoms do not improve or if they worsen. Make sure to complete the full course of Augmentin  as prescribed. If you have any concerns about your upcoming flight, please let us  know.

## 2024-10-05 NOTE — Progress Notes (Signed)
 " Ph: [phone] Fax: [phone]   Patient ID: Brittany [name] Payor, female    DOB: 02/27/1956, 69 y.o.   MRN: [id number]  This visit was conducted in person.  BP 130/76 (BP Location: Left Arm, Patient Position: Sitting, Cuff Size: Normal)   Pulse 88   Temp 98.6 F (37 C) (Oral)   Ht 5' 2.5 (1.588 m)   Wt 182 lb 6.4 oz (82.7 kg)   SpO2 98%   BMI 32.83 kg/m    Chief Complaint  Patient presents with   Acute Visit    Foul odor, cloudy, urine, some burning with urination at the beginning, has been treating with OTC AZO's onset 1/23  Post nasal drip, sore throat, runny nose, low grade fever, sinus pressure, headache, treating with OTC night quil/ day quil 1/25  Pt has flight coming up on 2/3 and is worried about flying    Subjective:   Discussed the use of AI scribe software for clinical note transcription with the patient, who gave verbal consent to proceed.  History of Present Illness   Brittany [name] is a 69 year old female who presents with symptoms of a possible urinary tract infection and respiratory congestion.  Urinary symptoms began Friday (4 days ago) with cloudy, malodorous urine, urgency, and increased frequency with initial dysuria improved with Azo use, without hematuria, flank pain, or significant lower abdominal pain. Urinary urgency and frequency have improved after Azo and cranberry pills. She reports a low-grade fever to 100.66F. Her last UTI was in 2022, and she previously took low-dose daily antibiotics for UTI prevention (unsure type).  Respiratory symptoms started Sunday night with congestion, dry cough, frontal and periocular pressure headache, postnasal drip, and difficulty clearing her throat. No nausea or vomiting. She has been taking Nyquil and DayQuil with partial relief.  She is worried about how these symptoms may affect an upcoming business flight to [facility] where she must manage a friend's trust and power of attorney. She also cares for disabled  husband with significant medical issues.          Relevant past medical, surgical, family and social history reviewed and updated as indicated. Interim medical history since our last visit reviewed. Allergies and medications reviewed and updated. Outpatient Medications Prior to Visit  Medication Sig Dispense Refill   Ascorbic Acid (VITAMIN C) 1000 MG tablet Take 1,000 mg by mouth daily.     atorvastatin  (LIPITOR) 20 MG tablet Take 1 tablet (20 mg total) by mouth daily. 100 tablet 3   Cholecalciferol  (VITAMIN D3) 25 MCG (1000 UT) CAPS Take 1 capsule (1,000 Units total) by mouth daily. 30 capsule    cyanocobalamin  (V-R VITAMIN B-12) 500 MCG tablet Take 1 tablet (500 mcg total) by mouth daily.     fluticasone  (FLONASE ) 50 MCG/ACT nasal spray SPRAY 2 SPRAYS INTO EACH NOSTRIL EVERY DAY (Patient taking differently: 2 (two) times daily as needed. SPRAY 2 SPRAYS INTO EACH NOSTRIL EVERY DAY) 48 mL 3   gabapentin  (NEURONTIN ) 600 MG tablet Take 1 tablet (600 mg total) by mouth at bedtime. 100 tablet 3   ibuprofen (ADVIL,MOTRIN) 200 MG tablet Take 600 mg by mouth as needed.     levothyroxine  (SYNTHROID ) 88 MCG tablet Take 1 tablet (88 mcg total) by mouth daily before breakfast. 100 tablet 3   losartan  (COZAAR ) 50 MG tablet Take 1 tablet (50 mg total) by mouth daily. 100 tablet 3   Magnesium  200 MG TABS Take 1 tablet (200 mg total) by  mouth daily.     Misc Natural Products (BEET ROOT) 500 MG CAPS Take 1 capsule by mouth daily.     Misc Natural Products (MILK THISTLE) CAPS Take 1 capsule by mouth daily.     omeprazole  (PRILOSEC) 40 MG capsule Take 1 capsule (40 mg total) by mouth daily. 100 capsule 3   venlafaxine  XR (EFFEXOR -XR) 75 MG 24 hr capsule TAKE 1 CAPSULE BY MOUTH DAILY  WITH BREAKFAST 100 capsule 3   zinc  gluconate 50 MG tablet Take 1 tablet (50 mg total) by mouth daily.     doxycycline  (VIBRA -TABS) 100 MG tablet Take 1 tablet (100 mg total) by mouth 2 (two) times daily. (Patient not taking:  Reported on 10/05/2024) 14 tablet 0   No facility-administered medications prior to visit.     Per HPI unless specifically indicated in ROS section below Review of Systems  Objective:  BP 130/76 (BP Location: Left Arm, Patient Position: Sitting, Cuff Size: Normal)   Pulse 88   Temp 98.6 F (37 C) (Oral)   Ht 5' 2.5 (1.588 m)   Wt 182 lb 6.4 oz (82.7 kg)   SpO2 98%   BMI 32.83 kg/m   Wt Readings from Last 3 Encounters:  10/05/24 182 lb 6.4 oz (82.7 kg)  09/21/24 180 lb (81.6 kg)  09/08/24 180 lb 12.8 oz (82 kg)           Physical Exam Vitals and nursing note reviewed.  Constitutional:      Appearance: Normal appearance. She is not ill-appearing.  HENT:     Head: Normocephalic and atraumatic.     Right Ear: Tympanic membrane, ear canal and external ear normal. There is no impacted cerumen.     Left Ear: Tympanic membrane, ear canal and external ear normal. There is no impacted cerumen.     Nose: Congestion and rhinorrhea present.     Right Sinus: Frontal sinus tenderness present. No maxillary sinus tenderness.     Left Sinus: Frontal sinus tenderness present. No maxillary sinus tenderness.     Comments: Wearing mask    Mouth/Throat:     Mouth: Mucous membranes are moist.     Pharynx: Oropharynx is clear. No oropharyngeal exudate or posterior oropharyngeal erythema.  Eyes:     Extraocular Movements: Extraocular movements intact.     Conjunctiva/sclera: Conjunctivae normal.     Pupils: Pupils are equal, round, and reactive to light.  Cardiovascular:     Rate and Rhythm: Normal rate and regular rhythm.     Pulses: Normal pulses.     Heart sounds: Normal heart sounds. No murmur heard. Pulmonary:     Effort: Pulmonary effort is normal. No respiratory distress.     Breath sounds: Normal breath sounds. No wheezing, rhonchi or rales.  Abdominal:     General: Bowel sounds are normal. There is no distension.     Palpations: Abdomen is soft. There is no mass.     Tenderness:  There is no abdominal tenderness. There is no right CVA tenderness, left CVA tenderness, guarding or rebound.     Hernia: No hernia is present.  Musculoskeletal:     Right lower leg: No edema.     Left lower leg: No edema.  Lymphadenopathy:     Head:     Right side of head: No submental, submandibular, tonsillar, preauricular or posterior auricular adenopathy.     Left side of head: No submental, submandibular, tonsillar, preauricular or posterior auricular adenopathy.     Cervical: No  cervical adenopathy.     Right cervical: No superficial cervical adenopathy.    Left cervical: No superficial cervical adenopathy.     Upper Body:     Right upper body: No supraclavicular adenopathy.     Left upper body: No supraclavicular adenopathy.  Skin:    General: Skin is warm and dry.     Findings: No rash.  Neurological:     Mental Status: She is alert.  Psychiatric:        Mood and Affect: Mood normal.        Behavior: Behavior normal.       Results   Labs COVID-19 PCR (10/05/2024): Negative Influenza PCR (10/05/2024): Negative       Results for orders placed or performed in visit on 10/05/24  POCT Urinalysis Dipstick (Automated)   Collection Time: 10/05/24 12:19 PM  Result Value Ref Range   Color, UA Yellow    Clarity, UA cloudy    Glucose, UA Negative Negative   Bilirubin, UA Negative    Ketones, UA Negative    Spec Grav, UA 1.015 1.010 - 1.025   Blood, UA Negative    pH, UA 6.0 5.0 - 8.0   Protein, UA Negative Negative   Urobilinogen, UA 0.2 0.2 or 1.0 E.U./dL   Nitrite, UA Negative    Leukocytes, UA Large (3+) (A) Negative  POCT Influenza A/B   Collection Time: 10/05/24 12:21 PM  Result Value Ref Range   Influenza A, POC Negative Negative   Influenza B, POC Negative Negative  POC COVID-19 BinaxNow   Collection Time: 10/05/24 12:24 PM  Result Value Ref Range   SARS Coronavirus 2 Ag Negative Negative    Assessment & Plan:      Acute cystitis Urine suggests  infection with 3+ LE. Not enough urine to check microscopy. No recent UTI history.  - Ordered urine culture. - Prescribed Augmentin . - Advised to stop Azo after one more day. - Cautioned against bladder irritants.  Acute sinusitis Symptoms suggest viral etiology. Negative COVID and flu swabs. Consider bacterial infection if symptoms persist beyond 7-10 days. - Prescribed Augmentin . - Advised increased fluid intake and rest. - Recommended Tylenol  for discomfort. - Cautioned against DayQuil.      Problem List Items Addressed This Visit     Acute cystitis without hematuria - Primary   UA abnormal, symptoms concerning for infection - UCx sent to confirm.  Rx augmentin  antibiotic while we await UCx.       Relevant Orders   Urine Culture   Acute viral sinusitis   Anticipate viral given short duration. Rx augmentin  for cystitis as per below.  Further supportive measures reviewed. Update if not improving with symptoms.       Relevant Medications   amoxicillin -clavulanate (AUGMENTIN ) 875-125 MG tablet   Other Visit Diagnoses       Foul smelling urine       Relevant Orders   POCT Urinalysis Dipstick (Automated) (Completed)   Urine Culture     Fever, unspecified fever cause       Relevant Orders   POC COVID-19 BinaxNow (Completed)   POCT Influenza A/B (Completed)     Dysuria            Meds ordered this encounter  Medications   amoxicillin -clavulanate (AUGMENTIN ) 875-125 MG tablet    Sig: Take 1 tablet by mouth 2 (two) times daily for 10 days.    Dispense:  20 tablet    Refill:  0  Orders Placed This Encounter  Procedures   Urine Culture   POCT Urinalysis Dipstick (Automated)   POC COVID-19 BinaxNow   POCT Influenza A/B    Patient Instructions  VISIT SUMMARY: During your visit, we discussed your symptoms of a possible urinary tract infection and respiratory congestion. You reported cloudy, malodorous urine with urgency and frequency, as well as respiratory  symptoms including congestion, dry cough, headache, and postnasal drip. We reviewed your recent history and current symptoms to determine the best course of action.  YOUR PLAN: -ACUTE CYSTITIS: Acute cystitis is a bacterial infection of the bladder. We have ordered a urine culture to confirm the infection and prescribed Augmentin  to treat it. You should stop taking Azo after one more day and avoid bladder irritants such as caffeine, alcohol, and spicy foods.  -ACUTE SINUSITIS: Acute sinusitis is an inflammation of the sinuses, often due to a viral infection. We have prescribed Augmentin  to treat any potential bacterial infection if symptoms persist beyond 7-10 days. In the meantime, increase your fluid intake, get plenty of rest, and take Tylenol  for discomfort. Avoid using DayQuil as it may increase blood pressure.  INSTRUCTIONS: Please follow up with us  if your symptoms do not improve or if they worsen. Make sure to complete the full course of Augmentin  as prescribed. If you have any concerns about your upcoming flight, please let us  know.  Follow up plan: No follow-ups on file.  Anton Blas, MD   "

## 2024-10-05 NOTE — Assessment & Plan Note (Signed)
 UA abnormal, symptoms concerning for infection - UCx sent to confirm.  Rx augmentin  antibiotic while we await UCx.

## 2024-10-08 ENCOUNTER — Encounter: Payer: Self-pay | Admitting: Family Medicine

## 2024-10-08 LAB — URINE CULTURE
MICRO NUMBER:: 17516594
SPECIMEN QUALITY:: ADEQUATE

## 2024-10-09 ENCOUNTER — Ambulatory Visit: Payer: Self-pay | Admitting: Family Medicine

## 2024-10-09 DIAGNOSIS — N3000 Acute cystitis without hematuria: Secondary | ICD-10-CM

## 2024-10-10 ENCOUNTER — Other Ambulatory Visit: Payer: Self-pay | Admitting: Family Medicine

## 2024-10-10 DIAGNOSIS — E785 Hyperlipidemia, unspecified: Secondary | ICD-10-CM

## 2024-10-19 ENCOUNTER — Other Ambulatory Visit

## 2024-10-26 ENCOUNTER — Encounter: Admitting: Family Medicine

## 2025-10-21 ENCOUNTER — Other Ambulatory Visit

## 2025-10-28 ENCOUNTER — Ambulatory Visit

## 2025-10-28 ENCOUNTER — Encounter: Admitting: Family Medicine
